# Patient Record
Sex: Male | Born: 1955 | Race: Black or African American | Hispanic: No | Smoking: Former smoker
Health system: Southern US, Community
[De-identification: ages and names within clinical notes are randomized; demographics above are authoritative.]

## PROBLEM LIST (undated history)

## (undated) DIAGNOSIS — Z95810 Presence of automatic (implantable) cardiac defibrillator: Secondary | ICD-10-CM

## (undated) DIAGNOSIS — I509 Heart failure, unspecified: Secondary | ICD-10-CM

## (undated) DIAGNOSIS — I255 Ischemic cardiomyopathy: Secondary | ICD-10-CM

## (undated) DIAGNOSIS — M1990 Unspecified osteoarthritis, unspecified site: Secondary | ICD-10-CM

## (undated) DIAGNOSIS — M109 Gout, unspecified: Secondary | ICD-10-CM

## (undated) DIAGNOSIS — IMO0001 Reserved for inherently not codable concepts without codable children: Secondary | ICD-10-CM

## (undated) DIAGNOSIS — D649 Anemia, unspecified: Secondary | ICD-10-CM

## (undated) DIAGNOSIS — J189 Pneumonia, unspecified organism: Secondary | ICD-10-CM

## (undated) DIAGNOSIS — E785 Hyperlipidemia, unspecified: Secondary | ICD-10-CM

## (undated) DIAGNOSIS — I2510 Atherosclerotic heart disease of native coronary artery without angina pectoris: Secondary | ICD-10-CM

## (undated) DIAGNOSIS — R6521 Severe sepsis with septic shock: Secondary | ICD-10-CM

## (undated) DIAGNOSIS — Z794 Long term (current) use of insulin: Secondary | ICD-10-CM

## (undated) DIAGNOSIS — R410 Disorientation, unspecified: Secondary | ICD-10-CM

## (undated) DIAGNOSIS — E119 Type 2 diabetes mellitus without complications: Secondary | ICD-10-CM

## (undated) DIAGNOSIS — I219 Acute myocardial infarction, unspecified: Secondary | ICD-10-CM

## (undated) DIAGNOSIS — Z9289 Personal history of other medical treatment: Secondary | ICD-10-CM

## (undated) DIAGNOSIS — F0150 Vascular dementia without behavioral disturbance: Secondary | ICD-10-CM

## (undated) DIAGNOSIS — A419 Sepsis, unspecified organism: Secondary | ICD-10-CM

## (undated) DIAGNOSIS — F419 Anxiety disorder, unspecified: Secondary | ICD-10-CM

## (undated) DIAGNOSIS — F4310 Post-traumatic stress disorder, unspecified: Secondary | ICD-10-CM

## (undated) DIAGNOSIS — I10 Essential (primary) hypertension: Secondary | ICD-10-CM

## (undated) DIAGNOSIS — N179 Acute kidney failure, unspecified: Secondary | ICD-10-CM

## (undated) DIAGNOSIS — F410 Panic disorder [episodic paroxysmal anxiety] without agoraphobia: Secondary | ICD-10-CM

## (undated) DIAGNOSIS — I4892 Unspecified atrial flutter: Secondary | ICD-10-CM

## (undated) HISTORY — DX: Atherosclerotic heart disease of native coronary artery without angina pectoris: I25.10

## (undated) HISTORY — DX: Unspecified atrial flutter: I48.92

## (undated) HISTORY — DX: Vascular dementia, unspecified severity, without behavioral disturbance, psychotic disturbance, mood disturbance, and anxiety: F01.50

## (undated) HISTORY — DX: Hyperlipidemia, unspecified: E78.5

## (undated) HISTORY — PX: FRACTURE SURGERY: SHX138

## (undated) HISTORY — PX: APPENDECTOMY: SHX54

## (undated) HISTORY — PX: CARDIOVERSION: SHX1299

## (undated) HISTORY — DX: Heart failure, unspecified: I50.9

## (undated) HISTORY — PX: CORONARY ANGIOPLASTY WITH STENT PLACEMENT: SHX49

## (undated) HISTORY — DX: Disorientation, unspecified: R41.0

## (undated) HISTORY — DX: Essential (primary) hypertension: I10

## (undated) HISTORY — DX: Pneumonia, unspecified organism: J18.9

## (undated) HISTORY — DX: Acute kidney failure, unspecified: N17.9

## (undated) HISTORY — DX: Anemia, unspecified: D64.9

## (undated) HISTORY — DX: Ischemic cardiomyopathy: I25.5

## (undated) HISTORY — DX: Anxiety disorder, unspecified: F41.9

## (undated) HISTORY — PX: JOINT REPLACEMENT: SHX530

## (undated) HISTORY — DX: Sepsis, unspecified organism: A41.9

## (undated) HISTORY — DX: Severe sepsis with septic shock: R65.21

## (undated) HISTORY — PX: CARDIAC DEFIBRILLATOR PLACEMENT: SHX171

---

## 1979-06-14 HISTORY — PX: ANKLE FRACTURE SURGERY: SHX122

## 1980-10-13 DIAGNOSIS — Z9289 Personal history of other medical treatment: Secondary | ICD-10-CM

## 1980-10-13 HISTORY — DX: Personal history of other medical treatment: Z92.89

## 1980-10-13 HISTORY — PX: PATELLA RECONSTRUCTION: SHX736

## 1980-10-13 HISTORY — PX: TIBIA FRACTURE SURGERY: SHX806

## 2003-04-25 ENCOUNTER — Ambulatory Visit (HOSPITAL_COMMUNITY): Admission: RE | Admit: 2003-04-25 | Discharge: 2003-04-25 | Payer: Self-pay | Admitting: Gastroenterology

## 2003-05-04 ENCOUNTER — Encounter: Payer: Self-pay | Admitting: Gastroenterology

## 2003-05-04 ENCOUNTER — Encounter: Admission: RE | Admit: 2003-05-04 | Discharge: 2003-05-04 | Payer: Self-pay | Admitting: Gastroenterology

## 2013-04-05 ENCOUNTER — Other Ambulatory Visit: Payer: Self-pay | Admitting: *Deleted

## 2013-04-05 MED ORDER — ALPRAZOLAM 0.5 MG PO TBDP: ORAL_TABLET | ORAL | Status: DC

## 2013-04-06 ENCOUNTER — Non-Acute Institutional Stay (SKILLED_NURSING_FACILITY): Payer: PRIVATE HEALTH INSURANCE | Admitting: Internal Medicine

## 2013-04-06 DIAGNOSIS — E785 Hyperlipidemia, unspecified: Secondary | ICD-10-CM

## 2013-04-06 DIAGNOSIS — E1129 Type 2 diabetes mellitus with other diabetic kidney complication: Secondary | ICD-10-CM

## 2013-04-06 DIAGNOSIS — I4891 Unspecified atrial fibrillation: Secondary | ICD-10-CM

## 2013-04-06 DIAGNOSIS — I509 Heart failure, unspecified: Secondary | ICD-10-CM

## 2013-04-14 ENCOUNTER — Other Ambulatory Visit: Payer: Self-pay | Admitting: Geriatric Medicine

## 2013-04-14 MED ORDER — OXYCODONE-ACETAMINOPHEN 5-325 MG PO TABS: 1.0000 | ORAL_TABLET | Freq: Four times a day (QID) | ORAL | Status: AC | PRN

## 2013-04-18 ENCOUNTER — Non-Acute Institutional Stay (SKILLED_NURSING_FACILITY): Payer: PRIVATE HEALTH INSURANCE | Admitting: Internal Medicine

## 2013-04-18 DIAGNOSIS — E1129 Type 2 diabetes mellitus with other diabetic kidney complication: Secondary | ICD-10-CM

## 2013-04-18 DIAGNOSIS — R748 Abnormal levels of other serum enzymes: Secondary | ICD-10-CM

## 2013-04-18 DIAGNOSIS — E875 Hyperkalemia: Secondary | ICD-10-CM

## 2013-04-18 DIAGNOSIS — N179 Acute kidney failure, unspecified: Secondary | ICD-10-CM

## 2013-04-21 ENCOUNTER — Non-Acute Institutional Stay (SKILLED_NURSING_FACILITY): Payer: PRIVATE HEALTH INSURANCE | Admitting: Adult Health

## 2013-04-21 ENCOUNTER — Encounter: Payer: Self-pay | Admitting: Adult Health

## 2013-04-21 DIAGNOSIS — I2510 Atherosclerotic heart disease of native coronary artery without angina pectoris: Secondary | ICD-10-CM

## 2013-04-21 DIAGNOSIS — E785 Hyperlipidemia, unspecified: Secondary | ICD-10-CM

## 2013-04-21 DIAGNOSIS — E1129 Type 2 diabetes mellitus with other diabetic kidney complication: Secondary | ICD-10-CM

## 2013-04-21 DIAGNOSIS — N058 Unspecified nephritic syndrome with other morphologic changes: Secondary | ICD-10-CM

## 2013-04-21 DIAGNOSIS — F411 Generalized anxiety disorder: Secondary | ICD-10-CM

## 2013-04-21 DIAGNOSIS — I509 Heart failure, unspecified: Secondary | ICD-10-CM

## 2013-04-21 DIAGNOSIS — I4892 Unspecified atrial flutter: Secondary | ICD-10-CM | POA: Insufficient documentation

## 2013-04-21 DIAGNOSIS — I5023 Acute on chronic systolic (congestive) heart failure: Secondary | ICD-10-CM

## 2013-04-21 DIAGNOSIS — K5900 Constipation, unspecified: Secondary | ICD-10-CM

## 2013-04-21 NOTE — Assessment & Plan Note (Signed)
will continue xanax 0.5 mg three times daily

## 2013-04-21 NOTE — Progress Notes (Signed)
Patient ID: Daniel Blankenship, male   DOB: Feb 03, 1956, 57 y.o.   MRN: [id number]  [facility] Allergies  Allergen Reactions  . Lisinopril   . Sulfa Antibiotics   . Zocor (Simvastatin)      Chief Complaint  Patient presents with  . Acute Visit    patient concerns     HPI: I was asked to see him regarding his chf; his ef is 15 %; he was started on demadex 40 mg yesterday as he had been on a diuretic in the past and his family was worried about his fluid build up. He denies any chest pain; but does have some mild shortness of present. He is a poor historian and is unable to fully participate in hpi or ros.   Past Medical History  Diagnosis Date  . Diabetes mellitus without complication   . Atrial flutter with rapid ventricular response   . Acute renal failure   . Vascular dementia   . CHF (congestive heart failure)   . Delirium   . Ischemic cardiomyopathy   . Septic shock   . Pneumonia   . CAD (coronary artery disease)   . Anemia   . Hypertension   . Hyperlipidemia   . Anxiety     Past Surgical History  Procedure Laterality Date  . Cardioversion    . Joint replacement    . Coronary angioplasty with stent placement    . Cardiac defibrillator placement      VITAL SIGNS BP 140/88  Pulse 140  Ht 6\' 1"  (1.854 m)  Wt 270 lb (122.471 kg)  BMI 35.63 kg/m2   Patient's Medications  New Prescriptions   No medications on file  Previous Medications   ALPRAZOLAM (NIRAVAM) 0.5 MG DISSOLVABLE TABLET    Take one tablet by mouth three times daily after meals. Hold for sedation.   ASPIRIN 81 MG TABLET    Take 81 mg by mouth daily.   ATORVASTATIN (LIPITOR) 80 MG TABLET    Take 40 mg by mouth daily.    DOCUSATE SODIUM (COLACE) 100 MG CAPSULE    Take 200 mg by mouth daily.   METOPROLOL TARTRATE (LOPRESSOR) 25 MG TABLET    Take 25 mg by mouth every 6 (six) hours.   NITROGLYCERIN (NITRODUR - DOSED IN MG/24 HR) 0.4 MG/HR    Place 1 patch onto the skin daily.   OXYCODONE-ACETAMINOPHEN (ROXICET) 5-325 MG PER TABLET    Take 1 tablet by mouth every 6 (six) hours as needed for pain.  Modified Medications   No medications on file  Discontinued Medications   No medications on file    SIGNIFICANT DIAGNOSTIC EXAMS   03-27-13: ekg: sinus tachycardia; 1st degree av block;  June 2014 hospitalization studies:  Chest x-ray: cardiomegaly; small effusions; mild pulmonary edema consistent with some element of fluid overload/congestive heart failure  Abdominal ultrasound: markedly limited examination secondary to body habitus; hepatic steatosis; nonspecific dilatation of common bile duct; no evidence of cholelithiasis, gallbladder wall thickening, or pericholecystic fluid collection. No intarhepatic biliary ductal dilatation noted; no gross hydronephrosis.   Ct of head: no definite evidence of acute intracranial abnormality; atrophy advanced for age. Although no evidence of acute infarction, mass, or hemorrhage is seen.    LABS REVIEWED:   04-15-13: wbc 12.5; hgb 9.3; hct 30.7; mcv 86; plt 292; glucose 188; bun 21; creat 1.44; k+ 5.5;na++ 136 Alk phos 553; t. Bili: 2.2  albumin 3.4 chol 87; ldl 43; trig 59; hgb a1c 7.7  04-19-13: wbc 6.9; hgb 9.0; hct 29.1; mcv 82; plt 261; glucose 157; bun 13; creat 1.06; k+ 4.3;na++ 141 Alk phos 344; t. Bili 0.9; alt 71;   albumin 3.1  Review of Systems  Constitutional: Negative for malaise/fatigue.  Respiratory: Positive for shortness of breath. Negative for cough and wheezing.   Cardiovascular: Positive for palpitations. Negative for chest pain and leg swelling.  Gastrointestinal: Negative for heartburn, abdominal pain and constipation.  Musculoskeletal: Negative for myalgias and joint pain.  Skin: Negative.   Neurological: Negative for headaches.  Psychiatric/Behavioral: Negative for depression. The patient does not have insomnia.     Physical Exam  Constitutional:  obese  Neck: Neck supple. No JVD present. No  thyromegaly present.  Cardiovascular: Intact distal pulses.   Heart rate rapid; and irregular  Respiratory: Effort normal and breath sounds normal. No respiratory distress.  GI: Soft. Bowel sounds are normal. He exhibits no distension. There is no tenderness.  Musculoskeletal: Normal range of motion. He exhibits edema.  Is in bed; has 2+ edema to mid thighs  Neurological: He is alert.  Skin: Skin is warm and dry.      ASSESSMENT/ PLAN: CHF (congestive heart failure), NYHA class III Ef is 15%; has been started on demadex 40 mg daily on 04-20-13; will check bmp in one week; will increase his lopressor to 50 mg every 6 hours and will monitor his b/p and pulse with each dose. Will continue 1500 cc fluid restriction; will begin daily weights if gain more than 3 pounds in one day or 5 pounds in one week to call provider for further instructions; will monitor his status   Other and unspecified hyperlipidemia Will continue lipitor 40 mg daily   CAD (coronary artery disease) Will continue ntg 0.4 mg patch daily and asa 81 mg daily   Atrial flutter with rapid ventricular response Is taking lopressor 25 mg every 6 hours for rate control will increase his lopressor to 50 mg every 6 hours will have nursing check blood pressure and pulse with every dose and will monitor   Anxiety state, unspecified will continue xanax 0.5 mg three times daily   Unspecified constipation Will continue colace 200 mg daily   DM (diabetes mellitus) type II controlled with renal manifestation Will continue lantus 10 units nightly and will monitor cbg QID    Time spent with patient 50 minutes

## 2013-04-21 NOTE — Assessment & Plan Note (Signed)
Is taking lopressor 25 mg every 6 hours for rate control will increase his lopressor to 50 mg every 6 hours will have nursing check blood pressure and pulse with every dose and will monitor

## 2013-04-21 NOTE — Assessment & Plan Note (Signed)
Will continue lipitor 40 mg daily

## 2013-04-21 NOTE — Assessment & Plan Note (Signed)
Will continue ntg 0.4 mg patch daily and asa 81 mg daily

## 2013-04-21 NOTE — Assessment & Plan Note (Signed)
Will continue lantus 10 units nightly and will monitor cbg QID

## 2013-04-21 NOTE — Assessment & Plan Note (Signed)
Will continue colace 200 mg daily

## 2013-04-21 NOTE — Assessment & Plan Note (Addendum)
Ef is 15%; has been started on demadex 40 mg daily on 04-20-13; will check bmp in one week; will increase his lopressor to 50 mg every 6 hours and will monitor his b/p and pulse with each dose. Will continue 1500 cc fluid restriction; will begin daily weights if gain more than 3 pounds in one day or 5 pounds in one week to call provider for further instructions; will monitor his status

## 2013-04-25 ENCOUNTER — Non-Acute Institutional Stay (SKILLED_NURSING_FACILITY): Payer: PRIVATE HEALTH INSURANCE | Admitting: Internal Medicine

## 2013-04-25 DIAGNOSIS — I4891 Unspecified atrial fibrillation: Secondary | ICD-10-CM

## 2013-04-25 DIAGNOSIS — I2510 Atherosclerotic heart disease of native coronary artery without angina pectoris: Secondary | ICD-10-CM

## 2013-04-25 DIAGNOSIS — I509 Heart failure, unspecified: Secondary | ICD-10-CM

## 2013-04-25 DIAGNOSIS — E1159 Type 2 diabetes mellitus with other circulatory complications: Secondary | ICD-10-CM | POA: Insufficient documentation

## 2013-04-25 NOTE — Progress Notes (Signed)
PROGRESS NOTE  DATE: 04/25/2013  FACILITY: Nursing Home Location: [facility]  LEVEL OF CARE: SNF (31)  Routine Visit  CHIEF COMPLAINT:  Manage CHF, atrial fibrillation and diabetes mellitus  HISTORY OF PRESENT ILLNESS:  REASSESSMENT OF ONGOING PROBLEM(S):  ATRIAL FIBRILLATION: the patients atrial fibrillation remains stable.  The patient denies DOE, tachycardia, orthopnea, transient neurological sx, pedal edema, palpitations, & PNDs.  No complications noted from the medications currently being used.  CHF:The patient does not relate significant weight changes, denies sob, DOE, orthopnea, PNDs, pedal edema, palpitations or chest pain.  CHF remains stable.  No complications form the medications being used.  DM:pt's DM remains stable.  Pt denies polyuria, polydipsia, polyphagia, changes in vision or hypoglycemic episodes.  No complications noted from the medication presently being used.  Last hemoglobin A1c is: 7.7 in 7/14.  PAST MEDICAL HISTORY : Reviewed.  No changes.  CURRENT MEDICATIONS: Reviewed per [facility]  REVIEW OF SYSTEMS:  GENERAL: no change in appetite, no fatigue, no weight changes, no fever, chills or weakness RESPIRATORY: no cough, SOB, DOE, wheezing, hemoptysis CARDIAC: no chest pain, edema or palpitations GI: no abdominal pain, diarrhea, constipation, heart burn, nausea or vomiting  PHYSICAL EXAMINATION  VS:  T 97       P 67      RR 18     BP 132/88     POX %     WT (Lb) 267.5  GENERAL: no acute distress, normal body habitus EYES: conjunctivae normal, sclerae normal, normal eye lids NECK: supple, trachea midline, no neck masses, no thyroid tenderness, no thyromegaly LYMPHATICS: no LAN in the neck, no supraclavicular LAN RESPIRATORY: breathing is even & unlabored, BS CTAB CARDIAC: RRR, no murmur,no extra heart sounds, no edema GI: abdomen soft, normal BS, no masses, no tenderness, no hepatomegaly, no splenomegaly PSYCHIATRIC: the patient is  alert & oriented to person, affect & behavior appropriate  LABS/RADIOLOGY:  7/14 hemoglobin 9, MCV 82 otherwise CBC normal, glucose 157, total protein 5.4, albumin 3.1, alkaline phosphatase 344, ALT 76 otherwise CMP normal, HDL 32 otherwise fasting lipid panel normal  ASSESSMENT/PLAN:  CHF-well compensated. Atrial fibrillation-rate controlled. Lopressor was increased. Diabetes mellitus with vascular complications-uncontrolled. Lantus was started. CAD-stable. Hyperlipidemia-Lipitor decreased. Constipation-well-controlled. Anxiety-stable.  CPT CODE: 40981

## 2013-04-30 ENCOUNTER — Non-Acute Institutional Stay (SKILLED_NURSING_FACILITY): Payer: PRIVATE HEALTH INSURANCE | Admitting: Internal Medicine

## 2013-04-30 DIAGNOSIS — E1129 Type 2 diabetes mellitus with other diabetic kidney complication: Secondary | ICD-10-CM

## 2013-04-30 DIAGNOSIS — I2510 Atherosclerotic heart disease of native coronary artery without angina pectoris: Secondary | ICD-10-CM

## 2013-04-30 DIAGNOSIS — I509 Heart failure, unspecified: Secondary | ICD-10-CM

## 2013-04-30 DIAGNOSIS — N058 Unspecified nephritic syndrome with other morphologic changes: Secondary | ICD-10-CM

## 2013-04-30 NOTE — Progress Notes (Signed)
Patient ID: Daniel Blankenship, male   DOB: [birth year], 57 y.o.   MRN: [id number] Facility; [facility] SNF. Chief complaint followup CHF, predischarge review. History this is a 57 year old man with an end-stage ischemic cardiomyopathy with an EF of 15%. He came to [place] from the [facility] however, have been previously at [facility]. He had an ICD implanted. At [facility]. He was on milrinone intravenously. He required to metoprolol for better heart rate control. He was deemed not to be a candidate for transplant. He is also a type II diabetic on insulin Lantus, so. The facility has recently been found to be hypokalemic with a potassium of 2.9. His BUN and creatinine were within normal limits. His potassium has been replaced today. This is 3.8. On 79 he was found to have an alkaline phosphatase of 344 mildly elevated ALT of 76. See an obvious explanation for this although the alkaline phosphatase has been higher than this. With regards to chronic renal failure. This is stable. Other than hypokalemia.  Review of systems; Respiratory he has exertional shortness of breath. Cardiac; there is some chest pain complaint here although he is seems to describe most of it to anxiety. This is not clearly exertional but in this setting certainly worrisome. Musculoskeletal; complaining of significant left shoulder pain over the last 3-4 days. There is no trauma history here.  Social; patient lives in [place], but has been obtaining his health care through the [place]. As I understand things so he is going home with family to [place], [place]. There are questions about his oxygen, which he has not been on previously. He is on a 1500 cc fluid.  Physical exam; O2 sat is in the high 90s on room air. Pulse rate of 100 and regular, respirations 18. Respiratory air entry is clear bilaterally. No crackles or wheezing, work of breathing is normal. Cardiac heart sounds are normal. There is no S4. No S3, JVP is not  elevated. No murmurs. Abdomen no liver no spleen no tenderness. No masses. Extremities minimal to no edema. [employer]. abduction of the left shoulder and tenderness over the anterior  Impression/plan #1 severe ischemic cardiomyopathy. He is on a 1500 cc fluid restriction, is Nitro-Dur patch 0.4 mg per hour, ASA 81 daily, Lopressor, 25 mg every 6 hours, Lipitor 80 mg at bedtime, he is not on any diuretics. 2 hypokalemia ; he was started on Demadex 40 mg a day. On 79. He did also see receive a dose of Kayexalate on 77. His potassium has been replaced and I will recheck his BMP on Monday #3 left shoulder pain; I suspect this is either a subacromial bursitis or a rotator cuff tendinitis. There would be very little options to treat this is certainly not a candidate for any anti-inflammatories. #4 chest pain; he is a vague historian nevertheless, in this setting, he probably is having some degree of angina. I am going to try to increase the beta blocker. #5 elevated liver function tests, I am not certain what is behind this especially his elevated alkaline phosphatase. He does not have symptoms and signs of obstructive cholelithiasis. #6 history of A. fib A. flutter. He has an [name].His metoprolol has been increased to 50 q6h #7 type 2 diabetes. It is clear that the instructions from the [facility] were to continue Lantus and sliding scale insulin yet. I cannot see that they actually discharged him on any of insulin. our service, started the Lantus 10 units at at bedtime on  7/7.  Patient is going on an extended journey to [place], [place] on Tuesday. I'm not completely happy with this given the severity of his heart disease. Nevertheless, if that's what he is going to do, he will probably require oxygen as he is extremely dyspneic on exertion in the setting of end-stage heart failure. We will need to check and see if the [facility] will pay for this since based on pure. O2 sat criteria, he doesn't really  meet the criteria however, I believe oxygen is justifiable. Based on the end-stage nature of his congestive heart failure, which is currently a very bad. Stage III to stage IV. He tells me he knows how to use his own insulin.

## 2013-05-02 ENCOUNTER — Non-Acute Institutional Stay (SKILLED_NURSING_FACILITY): Payer: PRIVATE HEALTH INSURANCE | Admitting: Adult Health

## 2013-05-02 DIAGNOSIS — F411 Generalized anxiety disorder: Secondary | ICD-10-CM

## 2013-05-02 DIAGNOSIS — I509 Heart failure, unspecified: Secondary | ICD-10-CM

## 2013-05-02 DIAGNOSIS — I4892 Unspecified atrial flutter: Secondary | ICD-10-CM

## 2013-05-02 DIAGNOSIS — E1129 Type 2 diabetes mellitus with other diabetic kidney complication: Secondary | ICD-10-CM

## 2013-05-09 NOTE — Progress Notes (Signed)
Patient ID: Daniel Blankenship, male   DOB: 01-20-1956, 57 y.o.   MRN: [id number]        HISTORY & PHYSICAL  DATE: 04/06/2013   FACILITY: [facility]  LEVEL OF CARE: SNF (31)  ALLERGIES:  Allergies  Allergen Reactions  . Lisinopril   . Sulfa Antibiotics   . Zocor (Simvastatin)     CHIEF COMPLAINT:  Manage CHF, atrial fibrillation, and diabetes mellitus.    HISTORY OF PRESENT ILLNESS:  The patient is a 57 year-old, Caucasian male who was hospitalized at [facility] for CHF exacerbation.  After hospitalization, he is admitted to this facility for short-term rehabilitation.   He has the following problems:   CHF:The patient does not relate significant weight changes, denies sob, DOE, orthopnea, PNDs, pedal edema, palpitations or chest pain.  CHF remains stable.  No complications form the medications being used.    DM:pt's DM remains stable.  Pt denies polyuria, polydipsia, polyphagia, changes in vision or hypoglycemic episodes.  No complications noted from the medication presently being used.  Last hemoglobin A1c is: A recent  hemoglobin A1C is not available.    ATRIAL FIBRILLATION: the patients atrial fibrillation remains stable.  The patient denies DOE, tachycardia, orthopnea, transient neurological sx, pedal edema, palpitations, & PNDs.  No complications noted from the medications currently being used.   PAST MEDICAL HISTORY :  Past Medical History  Diagnosis Date  . Diabetes mellitus without complication   . Atrial flutter with rapid ventricular response   . Acute renal failure   . Vascular dementia   . CHF (congestive heart failure)   . Delirium   . Ischemic cardiomyopathy   . Septic shock   . Pneumonia   . CAD (coronary artery disease)   . Anemia   . Hypertension   . Hyperlipidemia   . Anxiety    Class III CHF, secondary to ischemic cardiomyopathy, EF 15%  Atrial fibrillation  Chronic kidney disease  Coronary artery disease,  status post stenting  PAST SURGICAL HISTORY: Past Surgical History  Procedure Laterality Date  . Cardioversion    . Joint replacement    . Coronary angioplasty with stent placement    . Cardiac defibrillator placement      SOCIAL HISTORY: TOBACCO USE:  History of tobacco abuse, but had quit.  ALCOHOL:  History of alcohol abuse, but had quit.  ILLICIT DRUGS:  Denies illicit drug use.  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  FAMILY HISTORY:  Family History  Problem Relation Age of Onset  . Family history unknown: Yes    CURRENT MEDICATIONS: Reviewed per [facility]  REVIEW OF SYSTEMS:  See HPI otherwise 14 point ROS is negative.  PHYSICAL EXAMINATION  VS:  T 97.8       P 96      RR 22      BP 122/98      POX 95% room air       WT (Lb) 267.5  GENERAL: no acute distress, moderately obese body habitus SKIN: warm & dry, no suspicious lesions or rashes, no excessive dryness EYES: conjunctivae normal, sclerae normal, normal eye lids MOUTH/THROAT: lips without lesions,no lesions in the mouth,tongue is without lesions,uvula elevates in midline NECK: supple, trachea midline, no neck masses, no thyroid tenderness, no thyromegaly LYMPHATICS: no LAN in the neck, no supraclavicular LAN RESPIRATORY: breathing is even & unlabored, BS CTAB CARDIAC: RRR, no murmur,no extra heart sounds, no edema GI:  ABDOMEN: abdomen  soft, normal BS, no masses, no tenderness  LIVER/SPLEEN: no hepatomegaly, no splenomegaly MUSCULOSKELETAL: HEAD: normal to inspection & palpation BACK: no kyphosis, scoliosis or spinal processes tenderness EXTREMITIES: LEFT UPPER EXTREMITY: full range of motion, normal strength & tone RIGHT UPPER EXTREMITY:  full range of motion, normal strength & tone LEFT LOWER EXTREMITY: strength intact, range of motion moderate  RIGHT LOWER EXTREMITY: strength intact, range of motion moderate  PSYCHIATRIC: the patient is alert & oriented to person, affect & behavior  appropriate  LABS/RADIOLOGY: None received at admission.     ASSESSMENT/PLAN:  CHF.  Adequately compensated.   Diabetes mellitus with renal complications.  Continue Lantus. Check hemoglobin A1c.   Atrial fibrillation.  Rate controlled.    Hyperlipidemia.  Check fasting lipid panel.  Continue atorvastatin.    Coronary artery disease.  Status post stenting.    Anemia of chronic kidney disease.  Reassess hemoglobin level.   Renovascular hypertension.  Well controlled.     Check CBC and CMP.   I have reviewed patient's medical records received at admission/from hospitalization.  CPT CODE: 45409

## 2013-05-15 NOTE — Progress Notes (Signed)
Patient ID: Daniel Blankenship, male   DOB: 1956-06-19, 57 y.o.   MRN: [id number]        PROGRESS NOTE  DATE: 04/18/2013  FACILITY:  [facility]  LEVEL OF CARE: SNF (31)  Acute Visit  CHIEF COMPLAINT:  Manage acute renal failure, diabetes mellitus, and hyperkalemia.    HISTORY OF PRESENT ILLNESS: I was requested by the staff to assess the patient regarding above problem(s):  DM:pt's DM is unstable.  CBGs are elevated in 200s to 300s.  Pt denies polyuria, polydipsia, polyphagia, changes in vision or hypoglycemic episodes.  No complications noted from the medication presently being used.  Last hemoglobin A1c is:  On 04/14/2013:  Hemoglobin A1c 7.7.    ACUTE RENAL FAILURE:  On 04/14/2013:  BUN 21, creatinine 1.44.  On 03/28/2013:  BUN ___, creatinine 1.37.  He is currently not on any renal toxic medications.    HYPERKALEMIA:  On 04/14/2013:  Potassium 5.5.  On 03/28/2013:  Potassium 3.5.  He is currently not on potassium supplementation.  He denies chest pain or palpitations.    PAST MEDICAL HISTORY : Reviewed.  No changes.  CURRENT MEDICATIONS: Reviewed per [facility]  REVIEW OF SYSTEMS:  GENERAL: no change in appetite, no fatigue, no weight changes, no fever, chills or weakness RESPIRATORY: no cough, SOB, DOE,, wheezing, hemoptysis CARDIAC: no chest pain, edema or palpitations GI: no abdominal pain, diarrhea, constipation, heart burn, nausea or vomiting  PHYSICAL EXAMINATION  GENERAL: no acute distress, moderately obese body habitus EYES: conjunctivae normal, sclerae normal, normal eye lids NECK: supple, trachea midline, no neck masses, no thyroid tenderness, no thyromegaly LYMPHATICS: no LAN in the neck, no supraclavicular LAN RESPIRATORY: breathing is even & unlabored, BS CTAB CARDIAC: RRR, no murmur,no extra heart sounds EDEMA/VARICOSITIES:  +2 bilateral lower extremity pitting edema  ARTERIAL:  pedal pulses nonpalpable  GI: abdomen soft, normal BS, no masses, no  tenderness, no hepatomegaly, no splenomegaly PSYCHIATRIC: the patient is alert & oriented to person, affect & behavior appropriate  LABS/RADIOLOGY: 04/14/2013:  WBC 12.5, hemoglobin 10.3, MCV 86, ANC 8.5.    Creatinine 1.44, potassium 5.5, albumin 3.4, CO2 15, total bilirubin 2.2, alkaline phosphatase 553, AST 131, ALT 89, otherwise CMP normal.    Fasting lipid panel normal.   03/28/2013:  WBC 10.6, hemoglobin 10.3.   Creatinine 1.37, potassium 3.5.    ASSESSMENT/PLAN:  Diabetes mellitus with renal complications.  Uncontrolled.  Start Lantus 10 U q.h.s.    Hyperkalemia.  New problem.  Give kayexalate 15 g once.  Check potassium level in .a.m.    Acute renal failure.  New problem.  Patient is on fluid restriction.  Reassess.    Elevated liver functions.  New problem.  Patient asymptomatic.  Reassess.    Leukocytosis with left shift.  New problem.  Reassess.   Anemia of chronic kidney disease.  Unstable problem.  Hemoglobin declined.  Reassess.    Hyperlipidemia.  Well controlled.  Decrease Lipitor to 40 mg q.h.s.    CPT CODE: 40981

## 2013-05-16 DIAGNOSIS — E1165 Type 2 diabetes mellitus with hyperglycemia: Secondary | ICD-10-CM | POA: Insufficient documentation

## 2013-09-20 NOTE — Progress Notes (Signed)
Patient ID: Daniel Blankenship, male   DOB: 1956/01/31, 57 y.o.   MRN: [id number]     [facility]  Allergies  Allergen Reactions  . Lisinopril   . Sulfa Antibiotics   . Zocor [Simvastatin]     Chief Complaint  Patient presents with  . Discharge Note   HPI  He is being discharged to home. He is going on a prolonged trip to [place]. He will not need O2 as his sats were 95% on room air. He will need home health or dme as he is going [place]. He will need prescriptions to be written.   Past Medical History  Diagnosis Date  . Diabetes mellitus without complication   . Atrial flutter with rapid ventricular response   . Acute renal failure   . Vascular dementia   . CHF (congestive heart failure)   . Delirium   . Ischemic cardiomyopathy   . Septic shock   . Pneumonia   . CAD (coronary artery disease)   . Anemia   . Hypertension   . Hyperlipidemia   . Anxiety     Past Surgical History  Procedure Laterality Date  . Cardioversion    . Joint replacement    . Coronary angioplasty with stent placement    . Cardiac defibrillator placement      Filed Vitals:   05/02/13 1420  BP: 116/60  Pulse: 102  Height: 6\' 1"  (1.854 m)  Weight: 270 lb (122.471 kg)   MEDICATIONS  K+ 20 meq daily Lopressor 50 mg every 6 hours  lipitor 40 mg daily lantus 10 units daily 1500 cc fluid restriction ntg 0.4 mg prn Asa 81 mg daily Colace daily Xanax 0.5 mg tid Percocet 5.325 mg every 6 hours as needed Albuterol mneb treatment every 6 hours.     SIGNIFICANT DIAGNOSTIC EXAMS   03-27-13: ekg: sinus tachycardia; 1st degree av block;  June 2014 hospitalization studies:  Chest x-ray: cardiomegaly; small effusions; mild pulmonary edema consistent with some element of fluid overload/congestive heart failure  Abdominal ultrasound: markedly limited examination secondary to body habitus; hepatic steatosis; nonspecific dilatation of common bile duct; no evidence of  cholelithiasis, gallbladder wall thickening, or pericholecystic fluid collection. No intarhepatic biliary ductal dilatation noted; no gross hydronephrosis.   Ct of head: no definite evidence of acute intracranial abnormality; atrophy advanced for age. Although no evidence of acute infarction, mass, or hemorrhage is seen.    LABS REVIEWED:   04-15-13: wbc 12.5; hgb 9.3; hct 30.7; mcv 86; plt 292; glucose 188; bun 21; creat 1.44; k+ 5.5;na++ 136 Alk phos 553; t. Bili: 2.2  albumin 3.4 chol 87; ldl 43; trig 59; hgb a1c 7.7   04-19-13: wbc 6.9; hgb 9.0; hct 29.1; mcv 82; plt 261; glucose 157; bun 13; creat 1.06; k+ 4.3;na++ 141 Alk phos 344; t. Bili 0.9; alt 71;  04-28-13; glucose 163; bun 13; creat 0.90; k+2.9; na++ 139 04-30-13; k+ 3.8   albumin 3.1  Review of Systems  Constitutional: Negative for malaise/fatigue.  Respiratory: Positive for shortness of breath. Negative for cough and wheezing.   Cardiovascular: Positive for palpitations. Negative for chest pain and leg swelling.  Gastrointestinal: Negative for heartburn, abdominal pain and constipation.  Musculoskeletal: Negative for myalgias and joint pain.  Skin: Negative.   Neurological: Negative for headaches.  Psychiatric/Behavioral: Negative for depression. The patient does not have insomnia.     Physical Exam  Constitutional:  obese  Neck: Neck supple. No JVD present. No  thyromegaly present.  Cardiovascular: Intact distal pulses.   Heart rate rapid; and irregular  Respiratory: Effort normal and breath sounds normal. No respiratory distress.  GI: Soft. Bowel sounds are normal. He exhibits no distension. There is no tenderness.  Musculoskeletal: Normal range of motion. He exhibits edema.  Is in bed; has 2+ edema to calfs   Neurological: He is alert.  Skin: Skin is warm and dry.    ASSESSMENT/PLAN  Will discharge to home with prescriptions. Will not need home health or dme.

## 2014-12-12 DIAGNOSIS — R6521 Severe sepsis with septic shock: Secondary | ICD-10-CM

## 2014-12-12 DIAGNOSIS — A419 Sepsis, unspecified organism: Secondary | ICD-10-CM

## 2014-12-12 HISTORY — DX: Sepsis, unspecified organism: A41.9

## 2014-12-12 HISTORY — DX: Severe sepsis with septic shock: R65.21

## 2014-12-31 ENCOUNTER — Encounter (HOSPITAL_COMMUNITY): Payer: Self-pay | Admitting: *Deleted

## 2014-12-31 ENCOUNTER — Emergency Department (HOSPITAL_COMMUNITY): Payer: Medicare Other

## 2014-12-31 ENCOUNTER — Inpatient Hospital Stay (HOSPITAL_COMMUNITY)
Admission: EM | Admit: 2014-12-31 | Discharge: 2015-01-12 | DRG: 273 | Disposition: A | Discharge status: Home Health | Payer: Medicare Other | Attending: Cardiology | Admitting: Cardiology

## 2014-12-31 DIAGNOSIS — I130 Hypertensive heart and chronic kidney disease with heart failure and stage 1 through stage 4 chronic kidney disease, or unspecified chronic kidney disease: Secondary | ICD-10-CM | POA: Diagnosis present

## 2014-12-31 DIAGNOSIS — R1084 Generalized abdominal pain: Secondary | ICD-10-CM | POA: Diagnosis not present

## 2014-12-31 DIAGNOSIS — J9601 Acute respiratory failure with hypoxia: Secondary | ICD-10-CM | POA: Diagnosis not present

## 2014-12-31 DIAGNOSIS — E785 Hyperlipidemia, unspecified: Secondary | ICD-10-CM | POA: Diagnosis present

## 2014-12-31 DIAGNOSIS — N058 Unspecified nephritic syndrome with other morphologic changes: Secondary | ICD-10-CM

## 2014-12-31 DIAGNOSIS — R911 Solitary pulmonary nodule: Secondary | ICD-10-CM | POA: Diagnosis present

## 2014-12-31 DIAGNOSIS — F419 Anxiety disorder, unspecified: Secondary | ICD-10-CM | POA: Diagnosis present

## 2014-12-31 DIAGNOSIS — I509 Heart failure, unspecified: Secondary | ICD-10-CM | POA: Insufficient documentation

## 2014-12-31 DIAGNOSIS — R0600 Dyspnea, unspecified: Secondary | ICD-10-CM | POA: Diagnosis not present

## 2014-12-31 DIAGNOSIS — R0902 Hypoxemia: Secondary | ICD-10-CM | POA: Insufficient documentation

## 2014-12-31 DIAGNOSIS — E1165 Type 2 diabetes mellitus with hyperglycemia: Secondary | ICD-10-CM | POA: Diagnosis present

## 2014-12-31 DIAGNOSIS — J9600 Acute respiratory failure, unspecified whether with hypoxia or hypercapnia: Secondary | ICD-10-CM

## 2014-12-31 DIAGNOSIS — F0150 Vascular dementia without behavioral disturbance: Secondary | ICD-10-CM | POA: Diagnosis present

## 2014-12-31 DIAGNOSIS — Z9119 Patient's noncompliance with other medical treatment and regimen: Secondary | ICD-10-CM | POA: Diagnosis present

## 2014-12-31 DIAGNOSIS — E869 Volume depletion, unspecified: Secondary | ICD-10-CM | POA: Diagnosis present

## 2014-12-31 DIAGNOSIS — R609 Edema, unspecified: Secondary | ICD-10-CM

## 2014-12-31 DIAGNOSIS — I5043 Acute on chronic combined systolic (congestive) and diastolic (congestive) heart failure: Principal | ICD-10-CM | POA: Diagnosis present

## 2014-12-31 DIAGNOSIS — I255 Ischemic cardiomyopathy: Secondary | ICD-10-CM | POA: Diagnosis present

## 2014-12-31 DIAGNOSIS — Z7982 Long term (current) use of aspirin: Secondary | ICD-10-CM

## 2014-12-31 DIAGNOSIS — I340 Nonrheumatic mitral (valve) insufficiency: Secondary | ICD-10-CM | POA: Diagnosis not present

## 2014-12-31 DIAGNOSIS — R1033 Periumbilical pain: Secondary | ICD-10-CM | POA: Diagnosis not present

## 2014-12-31 DIAGNOSIS — I2510 Atherosclerotic heart disease of native coronary artery without angina pectoris: Secondary | ICD-10-CM | POA: Diagnosis not present

## 2014-12-31 DIAGNOSIS — Z95810 Presence of automatic (implantable) cardiac defibrillator: Secondary | ICD-10-CM

## 2014-12-31 DIAGNOSIS — I4892 Unspecified atrial flutter: Secondary | ICD-10-CM | POA: Diagnosis not present

## 2014-12-31 DIAGNOSIS — I5023 Acute on chronic systolic (congestive) heart failure: Secondary | ICD-10-CM

## 2014-12-31 DIAGNOSIS — I10 Essential (primary) hypertension: Secondary | ICD-10-CM | POA: Diagnosis present

## 2014-12-31 DIAGNOSIS — I471 Supraventricular tachycardia: Secondary | ICD-10-CM | POA: Diagnosis present

## 2014-12-31 DIAGNOSIS — N183 Chronic kidney disease, stage 3 unspecified: Secondary | ICD-10-CM | POA: Diagnosis present

## 2014-12-31 DIAGNOSIS — N179 Acute kidney failure, unspecified: Secondary | ICD-10-CM | POA: Diagnosis not present

## 2014-12-31 DIAGNOSIS — R0602 Shortness of breath: Secondary | ICD-10-CM | POA: Diagnosis present

## 2014-12-31 DIAGNOSIS — Z9861 Coronary angioplasty status: Secondary | ICD-10-CM

## 2014-12-31 DIAGNOSIS — R000 Tachycardia, unspecified: Secondary | ICD-10-CM

## 2014-12-31 DIAGNOSIS — J95821 Acute postprocedural respiratory failure: Secondary | ICD-10-CM | POA: Diagnosis not present

## 2014-12-31 DIAGNOSIS — R739 Hyperglycemia, unspecified: Secondary | ICD-10-CM | POA: Diagnosis not present

## 2014-12-31 DIAGNOSIS — E669 Obesity, unspecified: Secondary | ICD-10-CM | POA: Diagnosis present

## 2014-12-31 DIAGNOSIS — I129 Hypertensive chronic kidney disease with stage 1 through stage 4 chronic kidney disease, or unspecified chronic kidney disease: Secondary | ICD-10-CM | POA: Diagnosis present

## 2014-12-31 DIAGNOSIS — E876 Hypokalemia: Secondary | ICD-10-CM | POA: Diagnosis not present

## 2014-12-31 DIAGNOSIS — M109 Gout, unspecified: Secondary | ICD-10-CM | POA: Diagnosis present

## 2014-12-31 DIAGNOSIS — I483 Typical atrial flutter: Secondary | ICD-10-CM | POA: Diagnosis not present

## 2014-12-31 DIAGNOSIS — I081 Rheumatic disorders of both mitral and tricuspid valves: Secondary | ICD-10-CM | POA: Diagnosis present

## 2014-12-31 DIAGNOSIS — E1122 Type 2 diabetes mellitus with diabetic chronic kidney disease: Secondary | ICD-10-CM | POA: Diagnosis present

## 2014-12-31 DIAGNOSIS — Z6836 Body mass index (BMI) 36.0-36.9, adult: Secondary | ICD-10-CM

## 2014-12-31 DIAGNOSIS — R599 Enlarged lymph nodes, unspecified: Secondary | ICD-10-CM | POA: Diagnosis present

## 2014-12-31 DIAGNOSIS — Z9114 Patient's other noncompliance with medication regimen: Secondary | ICD-10-CM | POA: Diagnosis present

## 2014-12-31 DIAGNOSIS — E1129 Type 2 diabetes mellitus with other diabetic kidney complication: Secondary | ICD-10-CM | POA: Diagnosis not present

## 2014-12-31 DIAGNOSIS — K7200 Acute and subacute hepatic failure without coma: Secondary | ICD-10-CM | POA: Diagnosis not present

## 2014-12-31 DIAGNOSIS — Z452 Encounter for adjustment and management of vascular access device: Secondary | ICD-10-CM

## 2014-12-31 DIAGNOSIS — R109 Unspecified abdominal pain: Secondary | ICD-10-CM | POA: Insufficient documentation

## 2014-12-31 DIAGNOSIS — J810 Acute pulmonary edema: Secondary | ICD-10-CM | POA: Insufficient documentation

## 2014-12-31 DIAGNOSIS — IMO0002 Reserved for concepts with insufficient information to code with codable children: Secondary | ICD-10-CM

## 2014-12-31 DIAGNOSIS — R7989 Other specified abnormal findings of blood chemistry: Secondary | ICD-10-CM

## 2014-12-31 DIAGNOSIS — R945 Abnormal results of liver function studies: Secondary | ICD-10-CM

## 2014-12-31 LAB — COMPREHENSIVE METABOLIC PANEL
ALT: 177 U/L — ABNORMAL HIGH (ref 0–53)
AST: 95 U/L — AB (ref 0–37)
Albumin: 3.4 g/dL — ABNORMAL LOW (ref 3.5–5.2)
Alkaline Phosphatase: 152 U/L — ABNORMAL HIGH (ref 39–117)
Anion gap: 16 — ABNORMAL HIGH (ref 5–15)
BILIRUBIN TOTAL: 5.4 mg/dL — AB (ref 0.3–1.2)
BUN: 40 mg/dL — ABNORMAL HIGH (ref 6–23)
CALCIUM: 8.6 mg/dL (ref 8.4–10.5)
CO2: 18 mmol/L — ABNORMAL LOW (ref 19–32)
Chloride: 99 mmol/L (ref 96–112)
Creatinine, Ser: 1.8 mg/dL — ABNORMAL HIGH (ref 0.50–1.35)
GFR, EST AFRICAN AMERICAN: 46 mL/min — AB (ref >90)
GFR, EST NON AFRICAN AMERICAN: 40 mL/min — AB (ref >90)
GLUCOSE: 409 mg/dL — AB (ref 70–99)
POTASSIUM: 5.1 mmol/L (ref 3.5–5.1)
Sodium: 133 mmol/L — ABNORMAL LOW (ref 135–145)
Total Protein: 6.1 g/dL (ref 6.0–8.3)

## 2014-12-31 LAB — I-STAT CHEM 8, ED
BUN: 41 mg/dL — AB (ref 6–23)
CALCIUM ION: 1.08 mmol/L — AB (ref 1.12–1.23)
CREATININE: 1.6 mg/dL — AB (ref 0.50–1.35)
Chloride: 100 mmol/L (ref 96–112)
GLUCOSE: 422 mg/dL — AB (ref 70–99)
HEMATOCRIT: 45 % (ref 39.0–52.0)
HEMOGLOBIN: 15.3 g/dL (ref 13.0–17.0)
Potassium: 5.3 mmol/L — ABNORMAL HIGH (ref 3.5–5.1)
Sodium: 133 mmol/L — ABNORMAL LOW (ref 135–145)
TCO2: 15 mmol/L (ref 0–100)

## 2014-12-31 LAB — RAPID URINE DRUG SCREEN, HOSP PERFORMED
AMPHETAMINES: NOT DETECTED
Barbiturates: NOT DETECTED
Benzodiazepines: POSITIVE — AB
COCAINE: NOT DETECTED
OPIATES: NOT DETECTED
TETRAHYDROCANNABINOL: NOT DETECTED

## 2014-12-31 LAB — CBC WITH DIFFERENTIAL/PLATELET
Basophils Absolute: 0.1 10*3/uL (ref 0.0–0.1)
Basophils Relative: 1 % (ref 0–1)
Eosinophils Absolute: 0 10*3/uL (ref 0.0–0.7)
Eosinophils Relative: 0 % (ref 0–5)
HCT: 43.1 % (ref 39.0–52.0)
Hemoglobin: 13.9 g/dL (ref 13.0–17.0)
LYMPHS ABS: 2.2 10*3/uL (ref 0.7–4.0)
Lymphocytes Relative: 18 % (ref 12–46)
MCH: 26.9 pg (ref 26.0–34.0)
MCHC: 32.3 g/dL (ref 30.0–36.0)
MCV: 83.4 fL (ref 78.0–100.0)
Monocytes Absolute: 0.6 10*3/uL (ref 0.1–1.0)
Monocytes Relative: 4 % (ref 3–12)
NEUTROS PCT: 77 % (ref 43–77)
Neutro Abs: 9.5 10*3/uL — ABNORMAL HIGH (ref 1.7–7.7)
Platelets: 281 10*3/uL (ref 150–400)
RBC: 5.17 MIL/uL (ref 4.22–5.81)
RDW: 16.6 % — AB (ref 11.5–15.5)
WBC: 12.4 10*3/uL — ABNORMAL HIGH (ref 4.0–10.5)

## 2014-12-31 LAB — URINALYSIS, ROUTINE W REFLEX MICROSCOPIC
Glucose, UA: 500 mg/dL — AB
Hgb urine dipstick: NEGATIVE
KETONES UR: 15 mg/dL — AB
LEUKOCYTES UA: NEGATIVE
NITRITE: NEGATIVE
Protein, ur: NEGATIVE mg/dL
Specific Gravity, Urine: 1.023 (ref 1.005–1.030)
Urobilinogen, UA: 1 mg/dL (ref 0.0–1.0)
pH: 5 (ref 5.0–8.0)

## 2014-12-31 LAB — GLUCOSE, CAPILLARY: GLUCOSE-CAPILLARY: 335 mg/dL — AB (ref 70–99)

## 2014-12-31 LAB — I-STAT TROPONIN, ED: Troponin i, poc: 0.04 ng/mL (ref 0.00–0.08)

## 2014-12-31 LAB — CBG MONITORING, ED: Glucose-Capillary: 379 mg/dL — ABNORMAL HIGH (ref 70–99)

## 2014-12-31 LAB — TROPONIN I: TROPONIN I: 0.06 ng/mL — AB (ref <0.031)

## 2014-12-31 LAB — D-DIMER, QUANTITATIVE: D-Dimer, Quant: 0.6 ug/mL-FEU — ABNORMAL HIGH (ref 0.00–0.48)

## 2014-12-31 LAB — BRAIN NATRIURETIC PEPTIDE: B NATRIURETIC PEPTIDE 5: 386.4 pg/mL — AB (ref 0.0–100.0)

## 2014-12-31 MED ORDER — ASPIRIN 81 MG PO CHEW
324.0000 mg | CHEWABLE_TABLET | Freq: Once | ORAL | Status: AC
  Administered 2014-12-31: 324 mg via ORAL
  Filled 2014-12-31: qty 4

## 2014-12-31 MED ORDER — ONDANSETRON HCL 4 MG/2ML IJ SOLN
4.0000 mg | Freq: Four times a day (QID) | INTRAMUSCULAR | Status: DC | PRN
  Administered 2015-01-04: 4 mg via INTRAVENOUS
  Filled 2014-12-31: qty 2

## 2014-12-31 MED ORDER — IOHEXOL 350 MG/ML SOLN
100.0000 mL | Freq: Once | INTRAVENOUS | Status: AC | PRN
  Administered 2014-12-31: 100 mL via INTRAVENOUS

## 2014-12-31 MED ORDER — FUROSEMIDE 10 MG/ML IJ SOLN
40.0000 mg | Freq: Two times a day (BID) | INTRAMUSCULAR | Status: DC
  Administered 2014-12-31 – 2015-01-02 (×5): 40 mg via INTRAVENOUS
  Filled 2014-12-31 (×9): qty 4

## 2014-12-31 MED ORDER — LORAZEPAM 1 MG PO TABS
1.0000 mg | ORAL_TABLET | Freq: Once | ORAL | Status: AC
  Administered 2014-12-31: 1 mg via ORAL
  Filled 2014-12-31: qty 1

## 2014-12-31 MED ORDER — ALPRAZOLAM 0.5 MG PO TABS
0.5000 mg | ORAL_TABLET | Freq: Three times a day (TID) | ORAL | Status: DC | PRN
  Administered 2015-01-01 – 2015-01-06 (×6): 0.5 mg via ORAL
  Filled 2014-12-31 (×6): qty 1

## 2014-12-31 MED ORDER — INSULIN GLARGINE 100 UNIT/ML ~~LOC~~ SOLN
20.0000 [IU] | Freq: Every day | SUBCUTANEOUS | Status: DC
  Administered 2014-12-31: 20 [IU] via SUBCUTANEOUS
  Filled 2014-12-31 (×2): qty 0.2

## 2014-12-31 MED ORDER — SODIUM CHLORIDE 0.9 % IV BOLUS (SEPSIS)
1000.0000 mL | Freq: Once | INTRAVENOUS | Status: AC
  Administered 2014-12-31: 1000 mL via INTRAVENOUS

## 2014-12-31 MED ORDER — ASPIRIN 81 MG PO TABS: 81.0000 mg | ORAL_TABLET | Freq: Every day | ORAL | Status: DC

## 2014-12-31 MED ORDER — ATORVASTATIN CALCIUM 40 MG PO TABS
40.0000 mg | ORAL_TABLET | Freq: Every day | ORAL | Status: DC
  Administered 2014-12-31 – 2015-01-01 (×2): 40 mg via ORAL
  Filled 2014-12-31 (×2): qty 1

## 2014-12-31 MED ORDER — ENOXAPARIN SODIUM 30 MG/0.3ML ~~LOC~~ SOLN
30.0000 mg | SUBCUTANEOUS | Status: DC
  Administered 2014-12-31: 30 mg via SUBCUTANEOUS
  Filled 2014-12-31 (×2): qty 0.3

## 2014-12-31 MED ORDER — INSULIN ASPART 100 UNIT/ML ~~LOC~~ SOLN
0.0000 [IU] | Freq: Three times a day (TID) | SUBCUTANEOUS | Status: DC
  Administered 2015-01-01 (×2): 15 [IU] via SUBCUTANEOUS

## 2014-12-31 MED ORDER — ACETAMINOPHEN 325 MG PO TABS
650.0000 mg | ORAL_TABLET | Freq: Four times a day (QID) | ORAL | Status: DC | PRN
Start: 2014-12-31 — End: 2015-01-05

## 2014-12-31 MED ORDER — ALPRAZOLAM 0.5 MG PO TBDP: 0.2500 mg | ORAL_TABLET | Freq: Three times a day (TID) | ORAL | Status: DC | PRN

## 2014-12-31 MED ORDER — ASPIRIN 81 MG PO CHEW
81.0000 mg | CHEWABLE_TABLET | Freq: Every day | ORAL | Status: DC
  Administered 2015-01-01 – 2015-01-03 (×3): 81 mg via ORAL
  Filled 2014-12-31 (×3): qty 1

## 2014-12-31 MED ORDER — DOCUSATE SODIUM 100 MG PO CAPS
200.0000 mg | ORAL_CAPSULE | Freq: Every day | ORAL | Status: DC
  Administered 2015-01-01 – 2015-01-12 (×7): 200 mg via ORAL
  Filled 2014-12-31 (×13): qty 2

## 2014-12-31 MED ORDER — METOPROLOL TARTRATE 25 MG PO TABS
25.0000 mg | ORAL_TABLET | Freq: Two times a day (BID) | ORAL | Status: DC
  Administered 2014-12-31 – 2015-01-02 (×4): 25 mg via ORAL
  Filled 2014-12-31 (×8): qty 1

## 2014-12-31 NOTE — H&P (Signed)
[facility] History and Physical  Daniel Blankenship WUJ:[id number] DOB: Aug 12, 1956 DOA: 12/31/2014  Referring physician: EDP PCP: No primary care provider on file.   Chief Complaint: shortness of breath, abd pain, leg swelling  HPI: Daniel Blankenship is a 59 y.o. male with past medical history of CAD, ischemic cardiomyopathy, [name], CK D, type 2 diabetes presents to the ER with the above complaint.  Patient is a very vague historian. He gets his care at the [facility] and reports him gradual onset dyspnea of 2 days ago associated with abdominal tightness and swelling of feet. He reports that he is not very compliant with his medications he also says that his blood sugars have been running higher in the 200 range in the last couple days. In addition he reports cough productive of white sputum for the last 3 days as well. He denies any fevers or chills denies vomiting or diarrhea. Code STEMI was called by EMS however this was canceled by cardiology after reviewing EKG    Review of Systems:  Constitutional: Positives bolded No weight loss, night sweats, Fevers, chills, fatigue.  HEENT:  No headaches, Difficulty swallowing,Tooth/dental problems,Sore throat,  No sneezing, itching, ear ache, nasal congestion, post nasal drip,  Cardio-vascular:  No chest pain, Orthopnea, PND, swelling in lower extremities, anasarca, dizziness, palpitations  GI:  No heartburn, indigestion, abdominal pain, nausea, vomiting, diarrhea, change in bowel habits, loss of appetite  Resp:  No shortness of breath with exertion or at rest. No excess mucus, no productive cough, No non-productive cough, No coughing up of blood.No change in color of mucus.No wheezing.No chest wall deformity  Skin:  no rash or lesions.  GU:  no dysuria, change in color of urine, no urgency or frequency. No flank pain.  Musculoskeletal:  No joint pain or swelling. No decreased range of motion. No back pain.  Psych:  No  change in mood or affect. No depression or anxiety. No memory loss.   Past Medical History  Diagnosis Date  . Diabetes mellitus without complication   . Atrial flutter with rapid ventricular response   . Acute renal failure   . Vascular dementia   . CHF (congestive heart failure)   . Delirium   . Ischemic cardiomyopathy   . Septic shock   . Pneumonia   . CAD (coronary artery disease)   . Anemia   . Hypertension   . Hyperlipidemia   . Anxiety    Past Surgical History  Procedure Laterality Date  . Cardioversion    . Joint replacement    . Coronary angioplasty with stent placement    . Cardiac defibrillator placement     Social History:  reports that he has never smoked. He does not have any smokeless tobacco history on file. He reports that he does not drink alcohol. His drug history is not on file.  Allergies  Allergen Reactions  . Lisinopril   . Sulfa Antibiotics   . Zocor [Simvastatin]     Family History  Problem Relation Age of Onset  . Family history unknown: Yes   history of heart disease in his father  Prior to Admission medications   Medication Sig Start Date End Date Taking? Authorizing Provider  ALPRAZolam (NIRAVAM) 0.5 MG dissolvable tablet Take one tablet by mouth three times daily after meals. Hold for sedation. 04/05/13  Yes Sharon Seller, NP  aspirin 81 MG tablet Take 81 mg by mouth daily.   Yes Historical Provider, MD  atorvastatin (LIPITOR)  80 MG tablet Take 40 mg by mouth daily.    Yes Historical Provider, MD  docusate sodium (COLACE) 100 MG capsule Take 200 mg by mouth daily.   Yes Historical Provider, MD  metoprolol tartrate (LOPRESSOR) 25 MG tablet Take 25 mg by mouth every 6 (six) hours.   Yes Historical Provider, MD  nitroGLYCERIN (NITRODUR - DOSED IN MG/24 HR) 0.4 mg/hr Place 1 patch onto the skin daily.   Yes Historical Provider, MD  oxyCODONE-acetaminophen (ROXICET) 5-325 MG per tablet Take 1 tablet by mouth every 6 (six) hours as needed for  pain. 04/14/13  Yes Kermit Baloiffany L Reed, DO   Physical Exam: Filed Vitals:   12/31/14 1730 12/31/14 1815 12/31/14 1830 12/31/14 1845  BP: 111/93 104/77 102/84 108/74  Pulse:  125 121 124  Temp:      TempSrc:      Resp: 25 21 24 19   Height:      Weight:      SpO2:  100% 100% 100%    Wt Readings from Last 3 Encounters:  12/31/14 127.914 kg (282 lb)  05/02/13 122.471 kg (270 lb)  04/21/13 122.471 kg (270 lb)    General:  Appears calm and comfortable, alert awake oriented to self place and time no distress Eyes: PERRL, normal lids, irises & conjunctiva ENT: grossly normal hearing, lips & tongue Neck: no LAD, masses or thyromegaly Cardiovascular: RRR, no m/r/g. 1plus LE edema. Telemetry: Sinus tachycardia Respiratory: Faint bibasilar crackles. Normal respiratory effort. Abdomen: soft, obese ntnd Skin: no rash or induration seen on limited exam Musculoskeletal: grossly normal tone BUE/BLE Psychiatric: grossly normal mood and affect, speech fluent and appropriate Neurologic: grossly non-focal.          Labs on Admission:  Basic Metabolic Panel:  Recent Labs Lab 12/31/14 1355 12/31/14 1418  NA 133* 133*  K 5.1 5.3*  CL 99 100  CO2 18*  --   GLUCOSE 409* 422*  BUN 40* 41*  CREATININE 1.80* 1.60*  CALCIUM 8.6  --    Liver Function Tests:  Recent Labs Lab 12/31/14 1355  AST 95*  ALT 177*  ALKPHOS 152*  BILITOT 5.4*  PROT 6.1  ALBUMIN 3.4*   No results for input(s): LIPASE, AMYLASE in the last 168 hours. No results for input(s): AMMONIA in the last 168 hours. CBC:  Recent Labs Lab 12/31/14 1355 12/31/14 1418  WBC 12.4*  --   NEUTROABS 9.5*  --   HGB 13.9 15.3  HCT 43.1 45.0  MCV 83.4  --   PLT 281  --    Cardiac Enzymes: No results for input(s): CKTOTAL, CKMB, CKMBINDEX, TROPONINI in the last 168 hours.  BNP (last 3 results)  Recent Labs  12/31/14 1430  BNP 386.4*    ProBNP (last 3 results) No results for input(s): PROBNP in the last 8760  hours.  CBG:  Recent Labs Lab 12/31/14 1602  GLUCAP 379*    Radiological Exams on Admission: Ct Angio Chest Pe W/cm &/or Wo Cm  12/31/2014   CLINICAL DATA:  Shortness of breath. Abdominal pain. Productive cough.  EXAM: CT ANGIOGRAPHY CHEST WITH CONTRAST  TECHNIQUE: Multidetector CT imaging of the chest was performed using the standard protocol during bolus administration of intravenous contrast. Multiplanar CT image reconstructions and MIPs were obtained to evaluate the vascular anatomy.  CONTRAST:  100mL OMNIPAQUE IOHEXOL 350 MG/ML SOLN  COMPARISON:  Chest x-ray dated 12/31/2014  FINDINGS: There are no pulmonary emboli or infiltrates or effusions. There is a 5 mm  peripheral nodule in the right lower lobe on image 58 of series 6. The patient has mediastinal and hilar adenopathy. There is a 13 mm node just to the left of the aortic arch on image 29, and azygos node measuring 13 mm on image 33, and a prominent nodal mass measuring 6.6 x 2.7 x 4.8 cm in the subcarinal region. Part of the soft tissue density represents the esophagus but I think of the scar this is adenopathy.  There is cardiomegaly with dilatation of the left ventricle.  No osseous abnormality. The visualized portion of the upper abdomen is normal.  Review of the MIP images confirms the above findings.  IMPRESSION: 1. No pulmonary emboli. 2. Cardiomegaly with dilatation of the right ventricle. 3. Mediastinal and hilar adenopathy of unknown etiology. The possibility of malignancy should be considered. 4. 5 mm nodule in the periphery of the right lower lobe. This should be followed up in 6 months with chest CT without contrast if the adenopathy is not determined to be malignant.   Electronically Signed   By: Francene Boyers M.D.   On: 12/31/2014 17:33   Dg Chest Port 1 View  12/31/2014   CLINICAL DATA:  Shortness of breath and chest pain  EXAM: PORTABLE CHEST - 1 VIEW  COMPARISON:  None.  FINDINGS: Cardiac shadow is enlarged. A defibrillator  is noted. No focal infiltrate or sizable effusion is seen.  IMPRESSION: No active disease.   Electronically Signed   By: Alcide Clever M.D.   On: 12/31/2014 14:14    EKG: Independently reviewed. 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 Acute on chronic systolic CHF -Admit to telemetry, cycle cardiac enzymes IV Lasix 40 mg every 12, monitor urine output and weights -Check 2-D echo -Continue metoprolol, add low-dose ace if creatinine remains stable  2. Hyperglycemia/uncontrolled diabetes -12 metformin, add low-dose Lantus and sliding-scale insulin -Follow-up hemoglobin A1c  3. CKD 3  -stable  4. Abnormal LFTs -Denies history of chronic liver disease -Check right upper quadrant ultrasound, could be passive congestion of the liver  5. Pulmonary nodule/mediastinal adenopathy -Needs pulmonary follow-up for this  Code Status: Full code (must indicate code status--if unknown or must be presumed, indicate so) DVT Prophylaxis: Lovenox Family Communication: None at bedside Disposition Plan: Inpatient  Time spent:  [facility] Pager [phone]

## 2014-12-31 NOTE — ED Notes (Signed)
[facility]

## 2014-12-31 NOTE — ED Provider Notes (Signed)
CSN: [id number]     Arrival date & time 12/31/14  1254 History   First MD Initiated Contact with Patient 12/31/14 1300     Chief Complaint  Patient presents with  . Shortness of Breath     (Consider location/radiation/quality/duration/timing/severity/associated sxs/prior Treatment) HPI    Daniel Blankenship is a 59 y.o. male who presents for evaluation of shortness of breath, abdominal pain, weakness, present for 2 days. He has also been vomiting and states that he has not been able to take his medicine for 2 days. Also, he has not used his metformin in 2 months because his hemoglobin A1c was 7. He also notes increased leg swelling and sputum production with cough. He denies fever, chills, back pain, dysuria or urinary frequency. He does not use oxygen currently. He presents by EMS for evaluation. EMS called ahead and were concerned about STEMI. I visualized EKG, after transmission and it did not appear to be a STEMI. Patient was transported, without specific treatment beyond oxygen therapy. He does not get medical care locally. He reports taking all his medicines as directed. There are no other known modifying factors.    Past Medical History  Diagnosis Date  . Diabetes mellitus without complication   . Atrial flutter with rapid ventricular response   . Acute renal failure   . Vascular dementia   . CHF (congestive heart failure)   . Delirium   . Ischemic cardiomyopathy   . Septic shock   . Pneumonia   . CAD (coronary artery disease)   . Anemia   . Hypertension   . Hyperlipidemia   . Anxiety    Past Surgical History  Procedure Laterality Date  . Cardioversion    . Joint replacement    . Coronary angioplasty with stent placement    . Cardiac defibrillator placement     Family History  Problem Relation Age of Onset  . Family history unknown: Yes   History  Substance Use Topics  . Smoking status: Never Smoker   . Smokeless tobacco: Not on file  . Alcohol Use: No     Review of Systems  All other systems reviewed and are negative.     Allergies  Lisinopril; Sulfa antibiotics; and Zocor  Home Medications   Prior to Admission medications   Medication Sig Start Date End Date Taking? Authorizing Provider  ALPRAZolam (NIRAVAM) 0.5 MG dissolvable tablet Take one tablet by mouth three times daily after meals. Hold for sedation. 04/05/13  Yes Sharon Seller, NP  aspirin 81 MG tablet Take 81 mg by mouth daily.   Yes Historical Provider, MD  atorvastatin (LIPITOR) 80 MG tablet Take 40 mg by mouth daily.    Yes Historical Provider, MD  docusate sodium (COLACE) 100 MG capsule Take 200 mg by mouth daily.   Yes Historical Provider, MD  metoprolol tartrate (LOPRESSOR) 25 MG tablet Take 25 mg by mouth every 6 (six) hours.   Yes Historical Provider, MD  nitroGLYCERIN (NITRODUR - DOSED IN MG/24 HR) 0.4 mg/hr Place 1 patch onto the skin daily.   Yes Historical Provider, MD  oxyCODONE-acetaminophen (ROXICET) 5-325 MG per tablet Take 1 tablet by mouth every 6 (six) hours as needed for pain. 04/14/13  Yes Tiffany L Reed, DO   BP 103/86 mmHg  Pulse 121  Temp(Src) 97.7 F (36.5 C) (Oral)  Resp 16  Ht 6\' 1"  (1.854 m)  Wt 282 lb (127.914 kg)  BMI 37.21 kg/m2  SpO2 96% Physical Exam  Constitutional: He  is oriented to person, place, and time. He appears well-developed and well-nourished. He appears distressed (He is uncomfortable).  HENT:  Head: Normocephalic and atraumatic.  Right Ear: External ear normal.  Left Ear: External ear normal.  Eyes: Conjunctivae and EOM are normal. Pupils are equal, round, and reactive to light.  Neck: Normal range of motion and phonation normal. Neck supple.  Cardiovascular: Regular rhythm and normal heart sounds.   Tachycardic  Pulmonary/Chest: Effort normal and breath sounds normal. He exhibits no bony tenderness.  Abdominal: Soft. There is no tenderness.  Musculoskeletal: Normal range of motion. He exhibits no tenderness.  1+  edema both lower legs bilaterally.  Neurological: He is alert and oriented to person, place, and time. No cranial nerve deficit or sensory deficit. He exhibits normal muscle tone. Coordination normal.  Skin: Skin is warm, dry and intact.  Psychiatric: He has a normal mood and affect. His behavior is normal. Judgment and thought content normal.  Nursing note and vitals reviewed.   ED Course  Procedures (including critical care time)   13:01- viewing EKG with inferior ST elevation. Placing called to STEMI physician to discuss abnormal EKG, without associated chest pain.  16:05- Discussing with Dr. Allyson Sabal.  He cannot see EKG at this time. He suggests calling a colleague in the Hospital.  Dr. Mayford Knife came and saw the patient and felt that the EKG did not represent a STEMI. She suggested proceeding with usual emergency department evaluation.   Medications  aspirin chewable tablet 324 mg (324 mg Oral Given 12/31/14 1331)  iohexol (OMNIPAQUE) 350 MG/ML injection 100 mL (100 mLs Intravenous Contrast Given 12/31/14 1652)    Patient Vitals for the past 24 hrs:  BP Temp Temp src Pulse Resp SpO2 Height Weight  12/31/14 1545 103/86 mmHg - - - 16 - - -  12/31/14 1430 - - - (!) 121 20 96 % - -  12/31/14 1330 (!) 88/56 mmHg - - 93 21 94 % - -  12/31/14 1315 104/61 mmHg - - (!) 122 19 100 % - -  12/31/14 1258 103/75 mmHg 97.7 F (36.5 C) Oral 116 18 99 %  (1.854 m) 282 lb (127.914 kg)    5:48 PM Reevaluation with update and discussion. After initial assessment and treatment, an updated evaluation reveals he is more comfortable now. He has been able to eat. Findings discussed with patient and family members, all questions answered.[name]    18:10- discussed with hospitalist, who will admit him as an unassigned patient.  CRITICAL CARE Performed by: Flint Melter Total critical care time: 45 minutes Critical care time was exclusive of separately billable procedures and treating  other patients. Critical care was necessary to treat or prevent imminent or life-threatening deterioration. Critical care was time spent personally by me on the following activities: development of treatment plan with patient and/or surrogate as well as nursing, discussions with consultants, evaluation of patient's response to treatment, examination of patient, obtaining history from patient or surrogate, ordering and performing treatments and interventions, ordering and review of laboratory studies, ordering and review of radiographic studies, pulse oximetry and re-evaluation of patient's condition.   Labs Review Labs Reviewed  COMPREHENSIVE METABOLIC PANEL - Abnormal; Notable for the following:    Sodium 133 (*)    CO2 18 (*)    Glucose, Bld 409 (*)    BUN 40 (*)    Creatinine, Ser 1.80 (*)    Albumin 3.4 (*)    AST 95 (*)  ALT 177 (*)    Alkaline Phosphatase 152 (*)    Total Bilirubin 5.4 (*)    GFR calc non Af Amer 40 (*)    GFR calc Af Amer 46 (*)    Anion gap 16 (*)    All other components within normal limits  CBC WITH DIFFERENTIAL/PLATELET - Abnormal; Notable for the following:    WBC 12.4 (*)    RDW 16.6 (*)    Neutro Abs 9.5 (*)    All other components within normal limits  D-DIMER, QUANTITATIVE - Abnormal; Notable for the following:    D-Dimer, Quant 0.60 (*)    All other components within normal limits  BRAIN NATRIURETIC PEPTIDE - Abnormal; Notable for the following:    B Natriuretic Peptide 386.4 (*)    All other components within normal limits  URINALYSIS, ROUTINE W REFLEX MICROSCOPIC - Abnormal; Notable for the following:    Color, Urine ORANGE (*)    APPearance CLOUDY (*)    Glucose, UA 500 (*)    Bilirubin Urine MODERATE (*)    Ketones, ur 15 (*)    All other components within normal limits  URINE RAPID DRUG SCREEN (HOSP PERFORMED) - Abnormal; Notable for the following:    Benzodiazepines POSITIVE (*)    All other components within normal limits  I-STAT  CHEM 8, ED - Abnormal; Notable for the following:    Sodium 133 (*)    Potassium 5.3 (*)    BUN 41 (*)    Creatinine, Ser 1.60 (*)    Glucose, Bld 422 (*)    Calcium, Ion 1.08 (*)    All other components within normal limits  CBG MONITORING, ED - Abnormal; Notable for the following:    Glucose-Capillary 379 (*)    All other components within normal limits  I-STAT TROPOININ, ED    Imaging Review Ct Angio Chest Pe W/cm &/or Wo Cm  12/31/2014   CLINICAL DATA:  Shortness of breath. Abdominal pain. Productive cough.  EXAM: CT ANGIOGRAPHY CHEST WITH CONTRAST  TECHNIQUE: Multidetector CT imaging of the chest was performed using the standard protocol during bolus administration of intravenous contrast. Multiplanar CT image reconstructions and MIPs were obtained to evaluate the vascular anatomy.  CONTRAST:  OMNIPAQUE IOHEXOL 350 MG/ML SOLN  COMPARISON:  Chest x-ray dated 12/31/2014  FINDINGS: There are no pulmonary emboli or infiltrates or effusions. There is a 5 mm peripheral nodule in the right lower lobe on image 58 of series 6. The patient has mediastinal and hilar adenopathy. There is a 13 mm node just to the left of the aortic arch on image 29, and azygos node measuring 13 mm on image 33, and a prominent nodal mass measuring 6.6 x 2.7 x 4.8 cm in the subcarinal region. Part of the soft tissue density represents the esophagus but I think of the scar this is adenopathy.  There is cardiomegaly with dilatation of the left ventricle.  No osseous abnormality. The visualized portion of the upper abdomen is normal.  Review of the MIP images confirms the above findings.  IMPRESSION: 1. No pulmonary emboli. 2. Cardiomegaly with dilatation of the right ventricle. 3. Mediastinal and hilar adenopathy of unknown etiology. The possibility of malignancy should be considered. 4. 5 mm nodule in the periphery of the right lower lobe. This should be followed up in 6 months with chest CT without contrast if the  adenopathy is not determined to be malignant.   Electronically Signed   By: Francene Boyers  M.D.   On: 12/31/2014 17:33   Dg Chest Port 1 View  12/31/2014   CLINICAL DATA:  Shortness of breath and chest pain  EXAM: PORTABLE CHEST - 1 VIEW  COMPARISON:  None.  FINDINGS: Cardiac shadow is enlarged. A defibrillator is noted. No focal infiltrate or sizable effusion is seen.  IMPRESSION: No active disease.   Electronically Signed   By: Alcide CleverMark  Lukens M.D.   On: 12/31/2014 14:14     EKG Interpretation   Date/Time:  Sunday December 31 2014 12:59:11 EDT Ventricular Rate:  127 PR Interval:  151 QRS Duration: 138 QT Interval:  392 QTC Calculation: 570 R Axis:   0 Text Interpretation:  Sinus tachycardia Nonspecific intraventricular  conduction delay Probable anterolateral infarct, age indeterm ST  elevation, consider inferior injury No old tracing to compare Confirmed by  [facility]  MD, Dequavion Follette [phone]([id number]) on 12/31/2014 4:00:00 PM         MDM   Final diagnoses:  Dyspnea  Hyperglycemia  Noncompliance with medications  Peripheral edema  Tachycardia    Nonspecific shortness of breath, without evidence for congestive heart failure, pneumonia, or PE. Moderate hyperglycemia, with slightly elevated anion gap 16. No evidence for UTI. He is diabetic, taking insulin but not his metformin. He is likely volume depleted explained the tachycardia, but not necessarily explaining the dyspnea. Patient has vascular dementia, and may have some misunderstanding about his opiate medication treatments. He will need to be admitted for stabilization, and further treatment.  Nursing Notes Reviewed/ Care Coordinated, and agree without changes. Applicable Imaging Reviewed.  Interpretation of Laboratory Data incorporated into ED treatment  Plan: Admit   Mancel BaleElliott Maxwel Meadowcroft, MD 12/31/14 [phone]

## 2014-12-31 NOTE — ED Notes (Signed)
Dinner tray ordered, carb modified heart healthy

## 2014-12-31 NOTE — ED Notes (Addendum)
Pt called EMS for SOB, abdominal pain, and generalized weakness x 2days.  Cough with thick secretions.  Pt is not on Oxygen at home. VS are as follows :BP:130/84 HR:90 Resp:24 O2sat:95% on RA. CBG:339  324mg  of ASA taken by pt at home. No meds given by EMS in route.

## 2014-12-31 NOTE — ED Notes (Signed)
CBG: 379, given a sandwich per MD. Tray ordered.

## 2014-12-31 NOTE — ED Notes (Signed)
Pt leaving for CT.  

## 2015-01-01 ENCOUNTER — Inpatient Hospital Stay (HOSPITAL_COMMUNITY): Payer: Medicare Other

## 2015-01-01 DIAGNOSIS — E669 Obesity, unspecified: Secondary | ICD-10-CM

## 2015-01-01 DIAGNOSIS — I10 Essential (primary) hypertension: Secondary | ICD-10-CM

## 2015-01-01 DIAGNOSIS — E1165 Type 2 diabetes mellitus with hyperglycemia: Secondary | ICD-10-CM

## 2015-01-01 DIAGNOSIS — R739 Hyperglycemia, unspecified: Secondary | ICD-10-CM

## 2015-01-01 LAB — COMPREHENSIVE METABOLIC PANEL
ALBUMIN: 3.2 g/dL — AB (ref 3.5–5.2)
ALT: 183 U/L — ABNORMAL HIGH (ref 0–53)
ANION GAP: 15 (ref 5–15)
AST: 133 U/L — ABNORMAL HIGH (ref 0–37)
Alkaline Phosphatase: 141 U/L — ABNORMAL HIGH (ref 39–117)
BUN: 43 mg/dL — ABNORMAL HIGH (ref 6–23)
CO2: 16 mmol/L — AB (ref 19–32)
CREATININE: 2.01 mg/dL — AB (ref 0.50–1.35)
Calcium: 8 mg/dL — ABNORMAL LOW (ref 8.4–10.5)
Chloride: 101 mmol/L (ref 96–112)
GFR calc Af Amer: 40 mL/min — ABNORMAL LOW (ref >90)
GFR calc non Af Amer: 35 mL/min — ABNORMAL LOW (ref >90)
GLUCOSE: 401 mg/dL — AB (ref 70–99)
Potassium: 4.3 mmol/L (ref 3.5–5.1)
SODIUM: 132 mmol/L — AB (ref 135–145)
TOTAL PROTEIN: 5.8 g/dL — AB (ref 6.0–8.3)
Total Bilirubin: 6.9 mg/dL — ABNORMAL HIGH (ref 0.3–1.2)

## 2015-01-01 LAB — GLUCOSE, CAPILLARY
Glucose-Capillary: 368 mg/dL — ABNORMAL HIGH (ref 70–99)
Glucose-Capillary: 355 mg/dL — ABNORMAL HIGH (ref 70–99)
Glucose-Capillary: 126 mg/dL — ABNORMAL HIGH (ref 70–99)
Glucose-Capillary: 171 mg/dL — ABNORMAL HIGH (ref 70–99)

## 2015-01-01 LAB — TROPONIN I: Troponin I: 0.06 ng/mL — ABNORMAL HIGH (ref <0.031)

## 2015-01-01 MED ORDER — INSULIN ASPART 100 UNIT/ML ~~LOC~~ SOLN
0.0000 [IU] | SUBCUTANEOUS | Status: DC
  Administered 2015-01-01: 4 [IU] via SUBCUTANEOUS

## 2015-01-01 MED ORDER — OXYCODONE-ACETAMINOPHEN 5-325 MG PO TABS
2.0000 | ORAL_TABLET | Freq: Once | ORAL | Status: AC
Start: 2015-01-01 — End: 2015-01-01
  Administered 2015-01-01: 2 via ORAL
  Filled 2015-01-01: qty 2

## 2015-01-01 MED ORDER — INSULIN GLARGINE 100 UNIT/ML ~~LOC~~ SOLN
30.0000 [IU] | Freq: Every day | SUBCUTANEOUS | Status: DC
  Administered 2015-01-01 – 2015-01-04 (×4): 30 [IU] via SUBCUTANEOUS
  Filled 2015-01-01 (×4): qty 0.3

## 2015-01-01 MED ORDER — OXYCODONE-ACETAMINOPHEN 5-325 MG PO TABS
1.0000 | ORAL_TABLET | ORAL | Status: DC | PRN
  Administered 2015-01-01 – 2015-01-12 (×10): 1 via ORAL
  Filled 2015-01-01 (×10): qty 1

## 2015-01-01 MED ORDER — ENOXAPARIN SODIUM 60 MG/0.6ML ~~LOC~~ SOLN
60.0000 mg | SUBCUTANEOUS | Status: DC
  Administered 2015-01-01: 60 mg via SUBCUTANEOUS
  Filled 2015-01-01 (×2): qty 0.6

## 2015-01-01 NOTE — Progress Notes (Signed)
Inpatient Diabetes Program Recommendations  AACE/ADA: New Consensus Statement on Inpatient Glycemic Control (2013)  Target Ranges:  Prepandial:   less than 140 mg/dL      Peak postprandial:   less than 180 mg/dL (1-2 hours)      Critically ill patients:  140 - 180 mg/dL   Results for Daniel Blankenship, Hisao T (MRN [id number]) as of 01/01/2015 09:33  Ref. Range 12/31/2014 16:02 12/31/2014 21:58 01/01/2015 06:59  Glucose-Capillary Latest Range: 70-99 mg/dL 409379 (H) 811335 (H) 914368 (H)   Current orders for Inpatient glycemic control: Lantus 20 units QHS, Novolog 0-15 units TID with meals  Inpatient Diabetes Program Recommendations Insulin - Basal: Patient received Lantus 20 units last night at bedtime and fasting glucose is 368 mg/dl this morning. Please consider increasing Lantus to 30 units QHS. Correction (SSI): Glucose was 335 mg/dl last night at 78:2921:58 but no insulin was given because no bedtime correction ordered. Please order Novolog bedtime correction scale. HgbA1C: A1C has been ordered and is in process.  Thanks, Orlando PennerMarie Shequila Neglia, RN, MSN, CCRN, CDE Diabetes Coordinator Inpatient Diabetes Program [phone] (Team Pager from 8am to 5pm) [phone] ([facility] office) [phone] [facility] office)

## 2015-01-01 NOTE — Progress Notes (Signed)
Utilization review completed.  

## 2015-01-01 NOTE — Progress Notes (Signed)
Report given to Heather RN. Afleming, RN 

## 2015-01-01 NOTE — Progress Notes (Signed)
PROGRESS NOTE  Daniel Blankenship ZOX:[id number] DOB: [birth year] DOA: 12/31/2014 PCP: No primary care provider on file.  HPI/Recap of past 23 hours: 59 year old male with past mental history of systolic heart failure with cardiomyopathy status post [name], diabetes mellitus with secondary renal disease and medical noncompliance  Assessment/Plan: Principal Problem:  Active Problems:   DM type 2, uncontrolled, with renal complications with acute hyperglycemia: Increase Lantus with Q4 hour CBGs until sugars below 200. Appreciate diabetic education follow-up   Acute on chronic systolic CHF (congestive heart failure): Continue diuresis   CKD (chronic kidney disease), stage III: Increasing creatinine following Lasix. We'll continue as patient is volume overloaded   Obesity (BMI 30-39.9): Patient is criteria with BMI greater than 30   Essential hypertension: Blood pressure stable, continue home medications Elevated troponin: Stable 0.06. Falsely elevated lab This is not ACS and more likely in the setting of renal failure and follow him overload   Code Status: Full code  Family Communication: None present  Disposition Plan: Home once fully diuresed and CBG stable  Consultants:  None  Procedures:  Echo pending  Antibiotics:  None   Objective: BP 110/67 mmHg  Pulse 67  Temp(Src) 98.5 F (36.9 C) (Oral)  Resp 20  Ht  (1.905 m)  Wt 132.5 kg (292 lb 1.8 oz)  BMI 36.51 kg/m2  SpO2 100%  Intake/Output Summary (Last 24 hours) at 01/01/15 1730 Last data filed at 01/01/15 1300  Gross per 24 hour  Intake    480 ml  Output   1075 ml  Net   -595 ml   Filed Weights   12/31/14 1258 12/31/14 2027 01/01/15 0557  Weight: 127.914 kg (282 lb) 133.3 kg (293 lb 14 oz) 132.5 kg (292 lb 1.8 oz)    Exam:   General:  alert and oriented 3, no acute distress  Cardiovascular: irregular rhythm, borderline tachycardia  Respiratory: Clear to auscultation bilaterally  Abdomen:  Soft, nontender, nondistended, positive bowel sounds  Musculoskeletal: Clubbing or cyanosis, 1+ pitting edema from the knees down    Data Reviewed: Basic Metabolic Panel:  Recent Labs Lab 12/31/14 1355 12/31/14 1418 01/01/15 0506  NA 133* 133* 132*  K 5.1 5.3* 4.3  CL 99 100 101  CO2 18*  --  16*  GLUCOSE 409* 422* 401*  BUN 40* 41* 43*  CREATININE 1.80* 1.60* 2.01*  CALCIUM 8.6  --  8.0*   Liver Function Tests:  Recent Labs Lab 12/31/14 1355 01/01/15 0506  AST 95* 133*  ALT 177* 183*  ALKPHOS 152* 141*  BILITOT 5.4* 6.9*  PROT 6.1 5.8*  ALBUMIN 3.4* 3.2*   No results for input(s): LIPASE, AMYLASE in the last 168 hours. No results for input(s): AMMONIA in the last 168 hours. CBC:  Recent Labs Lab 12/31/14 1355 12/31/14 1418  WBC 12.4*  --   NEUTROABS 9.5*  --   HGB 13.9 15.3  HCT 43.1 45.0  MCV 83.4  --   PLT 281  --    Cardiac Enzymes:    Recent Labs Lab 12/31/14 2100 01/01/15 0506  TROPONINI 0.06* 0.06*   BNP (last 3 results)  Recent Labs  12/31/14 1430  BNP 386.4*    ProBNP (last 3 results) No results for input(s): PROBNP in the last 8760 hours.  CBG:  Recent Labs Lab 12/31/14 1602 12/31/14 2158 01/01/15 0659 01/01/15 1106  GLUCAP 379* 335* 368* 355*    No results found for this or any previous visit (from the past 240  hour(s)).   Studies: Ct Angio Chest Pe W/cm &/or Wo Cm  12/31/2014   CLINICAL DATA:  Shortness of breath. Abdominal pain. Productive cough.  EXAM: CT ANGIOGRAPHY CHEST WITH CONTRAST  TECHNIQUE: Multidetector CT imaging of the chest was performed using the standard protocol during bolus administration of intravenous contrast. Multiplanar CT image reconstructions and MIPs were obtained to evaluate the vascular anatomy.  CONTRAST:  100mL OMNIPAQUE IOHEXOL 350 MG/ML SOLN  COMPARISON:  Chest x-ray dated 12/31/2014  FINDINGS: There are no pulmonary emboli or infiltrates or effusions. There is a 5 mm peripheral nodule in  the right lower lobe on image 58 of series 6. The patient has mediastinal and hilar adenopathy. There is a 13 mm node just to the left of the aortic arch on image 29, and azygos node measuring 13 mm on image 33, and a prominent nodal mass measuring 6.6 x 2.7 x 4.8 cm in the subcarinal region. Part of the soft tissue density represents the esophagus but I think of the scar this is adenopathy.  There is cardiomegaly with dilatation of the left ventricle.  No osseous abnormality. The visualized portion of the upper abdomen is normal.  Review of the MIP images confirms the above findings.  IMPRESSION: 1. No pulmonary emboli. 2. Cardiomegaly with dilatation of the right ventricle. 3. Mediastinal and hilar adenopathy of unknown etiology. The possibility of malignancy should be considered. 4. 5 mm nodule in the periphery of the right lower lobe. This should be followed up in 6 months with chest CT without contrast if the adenopathy is not determined to be malignant.   Electronically Signed   By: Francene BoyersJames  Maxwell M.D.   On: 12/31/2014 17:33   Dg Chest Port 1 View  12/31/2014   CLINICAL DATA:  Shortness of breath and chest pain  EXAM: PORTABLE CHEST - 1 VIEW  COMPARISON:  None.  FINDINGS: Cardiac shadow is enlarged. A defibrillator is noted. No focal infiltrate or sizable effusion is seen.  IMPRESSION: No active disease.   Electronically Signed   By: Alcide CleverMark  Lukens M.D.   On: 12/31/2014 14:14    Scheduled Meds: . aspirin  81 mg Oral Daily  . docusate sodium  200 mg Oral Daily  . enoxaparin (LOVENOX) injection  60 mg Subcutaneous Q24H  . furosemide  40 mg Intravenous Q12H  . insulin aspart  0-20 Units Subcutaneous 6 times per day  . insulin glargine  30 Units Subcutaneous QHS  . metoprolol tartrate  25 mg Oral BID    Continuous Infusions:    Time spent: 25 minutes  Hollice EspyKRISHNAN,Tirso Laws K  [facility] Pager [phone]. If 7PM-7AM, please contact night-coverage at [email or web address], password [email or web address] 01/01/2015, 5:30  PM  LOS: 1 day

## 2015-01-02 ENCOUNTER — Inpatient Hospital Stay (HOSPITAL_COMMUNITY): Payer: Medicare Other

## 2015-01-02 ENCOUNTER — Encounter (HOSPITAL_COMMUNITY): Payer: Self-pay | Admitting: *Deleted

## 2015-01-02 DIAGNOSIS — I5043 Acute on chronic combined systolic (congestive) and diastolic (congestive) heart failure: Principal | ICD-10-CM

## 2015-01-02 DIAGNOSIS — R1084 Generalized abdominal pain: Secondary | ICD-10-CM

## 2015-01-02 DIAGNOSIS — K7200 Acute and subacute hepatic failure without coma: Secondary | ICD-10-CM | POA: Diagnosis present

## 2015-01-02 DIAGNOSIS — R109 Unspecified abdominal pain: Secondary | ICD-10-CM | POA: Insufficient documentation

## 2015-01-02 DIAGNOSIS — R0600 Dyspnea, unspecified: Secondary | ICD-10-CM

## 2015-01-02 LAB — PROTIME-INR
INR: 1.63 — AB (ref 0.00–1.49)
PROTHROMBIN TIME: 19.5 s — AB (ref 11.6–15.2)

## 2015-01-02 LAB — BASIC METABOLIC PANEL
Anion gap: 16 — ABNORMAL HIGH (ref 5–15)
BUN: 52 mg/dL — ABNORMAL HIGH (ref 6–23)
CALCIUM: 8.4 mg/dL (ref 8.4–10.5)
CHLORIDE: 101 mmol/L (ref 96–112)
CO2: 20 mmol/L (ref 19–32)
Creatinine, Ser: 2.13 mg/dL — ABNORMAL HIGH (ref 0.50–1.35)
GFR, EST AFRICAN AMERICAN: 37 mL/min — AB (ref >90)
GFR, EST NON AFRICAN AMERICAN: 32 mL/min — AB (ref >90)
Glucose, Bld: 99 mg/dL (ref 70–99)
POTASSIUM: 4 mmol/L (ref 3.5–5.1)
Sodium: 137 mmol/L (ref 135–145)

## 2015-01-02 LAB — HEMOGLOBIN A1C
HEMOGLOBIN A1C: 12.9 % — AB (ref 4.8–5.6)
Mean Plasma Glucose: 324 mg/dL

## 2015-01-02 LAB — GLUCOSE, CAPILLARY
GLUCOSE-CAPILLARY: 104 mg/dL — AB (ref 70–99)
GLUCOSE-CAPILLARY: 126 mg/dL — AB (ref 70–99)
Glucose-Capillary: 102 mg/dL — ABNORMAL HIGH (ref 70–99)
Glucose-Capillary: 126 mg/dL — ABNORMAL HIGH (ref 70–99)
Glucose-Capillary: 98 mg/dL (ref 70–99)
Glucose-Capillary: 126 mg/dL — ABNORMAL HIGH (ref 70–99)

## 2015-01-02 LAB — MRSA PCR SCREENING: MRSA by PCR: NEGATIVE

## 2015-01-02 LAB — CBC
HCT: 44.6 % (ref 39.0–52.0)
Hemoglobin: 15.1 g/dL (ref 13.0–17.0)
MCH: 28.4 pg (ref 26.0–34.0)
MCHC: 33.9 g/dL (ref 30.0–36.0)
MCV: 84 fL (ref 78.0–100.0)
Platelets: 264 10*3/uL (ref 150–400)
RBC: 5.31 MIL/uL (ref 4.22–5.81)
RDW: 17.3 % — ABNORMAL HIGH (ref 11.5–15.5)
WBC: 15 10*3/uL — ABNORMAL HIGH (ref 4.0–10.5)

## 2015-01-02 MED ORDER — MILRINONE IN DEXTROSE 20 MG/100ML IV SOLN
0.3750 ug/kg/min | INTRAVENOUS | Status: DC
  Administered 2015-01-02 – 2015-01-05 (×7): 0.25 ug/kg/min via INTRAVENOUS
  Administered 2015-01-05: 14:00:00 via INTRAVENOUS
  Administered 2015-01-06 – 2015-01-07 (×5): 0.375 ug/kg/min via INTRAVENOUS
  Administered 2015-01-07 (×2): 0.25 ug/kg/min via INTRAVENOUS
  Administered 2015-01-08: 0.125 ug/kg/min via INTRAVENOUS
  Administered 2015-01-08: 0.25 ug/kg/min via INTRAVENOUS
  Administered 2015-01-08: 0.125 ug/kg/min via INTRAVENOUS
  Administered 2015-01-09 – 2015-01-12 (×6): 0.25 ug/kg/min via INTRAVENOUS
  Administered 2015-01-12: 0.375 ug/kg/min via INTRAVENOUS
  Filled 2015-01-02 (×26): qty 100

## 2015-01-02 MED ORDER — AMIODARONE HCL IN DEXTROSE 360-4.14 MG/200ML-% IV SOLN
30.0000 mg/h | INTRAVENOUS | Status: DC
  Administered 2015-01-03: 60 mg/h via INTRAVENOUS
  Administered 2015-01-03 – 2015-01-04 (×2): 30 mg/h via INTRAVENOUS
  Administered 2015-01-04 – 2015-01-05 (×3): 60 mg/h via INTRAVENOUS
  Administered 2015-01-05: 14:00:00 via INTRAVENOUS
  Administered 2015-01-05: 60 mg/h via INTRAVENOUS
  Filled 2015-01-02 (×18): qty 200

## 2015-01-02 MED ORDER — IOHEXOL 300 MG/ML  SOLN
25.0000 mL | INTRAMUSCULAR | Status: AC
  Administered 2015-01-02 (×2): 25 mL via ORAL

## 2015-01-02 MED ORDER — PERFLUTREN LIPID MICROSPHERE
1.0000 mL | INTRAVENOUS | Status: AC | PRN
  Administered 2015-01-02: 2 mL via INTRAVENOUS
  Filled 2015-01-02: qty 10

## 2015-01-02 MED ORDER — AMIODARONE HCL IN DEXTROSE 360-4.14 MG/200ML-% IV SOLN
60.0000 mg/h | INTRAVENOUS | Status: AC
  Administered 2015-01-02: 60 mg/h via INTRAVENOUS
  Filled 2015-01-02 (×2): qty 200

## 2015-01-02 MED ORDER — CETYLPYRIDINIUM CHLORIDE 0.05 % MT LIQD
7.0000 mL | Freq: Two times a day (BID) | OROMUCOSAL | Status: DC
  Administered 2015-01-02 – 2015-01-04 (×5): 7 mL via OROMUCOSAL

## 2015-01-02 MED ORDER — INSULIN ASPART 100 UNIT/ML ~~LOC~~ SOLN
0.0000 [IU] | Freq: Every day | SUBCUTANEOUS | Status: DC
  Administered 2015-01-05: 2 [IU] via SUBCUTANEOUS
  Administered 2015-01-06: 3 [IU] via SUBCUTANEOUS
  Administered 2015-01-07: 4 [IU] via SUBCUTANEOUS
  Administered 2015-01-09: 3 [IU] via SUBCUTANEOUS
  Administered 2015-01-10: 2 [IU] via SUBCUTANEOUS

## 2015-01-02 MED ORDER — INSULIN ASPART 100 UNIT/ML ~~LOC~~ SOLN
0.0000 [IU] | Freq: Three times a day (TID) | SUBCUTANEOUS | Status: DC
  Administered 2015-01-02: 3 [IU] via SUBCUTANEOUS
  Administered 2015-01-03 (×2): 4 [IU] via SUBCUTANEOUS
  Administered 2015-01-04 (×2): 7 [IU] via SUBCUTANEOUS
  Administered 2015-01-05: 15 [IU] via SUBCUTANEOUS
  Administered 2015-01-06: 4 [IU] via SUBCUTANEOUS
  Administered 2015-01-06 (×2): 7 [IU] via SUBCUTANEOUS

## 2015-01-02 MED ORDER — SODIUM CHLORIDE 0.9 % IJ SOLN
10.0000 mL | Freq: Two times a day (BID) | INTRAMUSCULAR | Status: DC
  Administered 2015-01-02 – 2015-01-11 (×12): 10 mL

## 2015-01-02 MED ORDER — SODIUM CHLORIDE 0.9 % IJ SOLN
10.0000 mL | INTRAMUSCULAR | Status: DC | PRN
  Administered 2015-01-06 – 2015-01-09 (×2): 10 mL
  Filled 2015-01-02 (×2): qty 40

## 2015-01-02 MED ORDER — HEPARIN (PORCINE) IN NACL 100-0.45 UNIT/ML-% IJ SOLN
1400.0000 [IU]/h | INTRAMUSCULAR | Status: DC
  Administered 2015-01-02: 1600 [IU]/h via INTRAVENOUS
  Filled 2015-01-02 (×4): qty 250

## 2015-01-02 MED ORDER — HEPARIN BOLUS VIA INFUSION
4000.0000 [IU] | Freq: Once | INTRAVENOUS | Status: AC
  Administered 2015-01-02: 4000 [IU] via INTRAVENOUS
  Filled 2015-01-02: qty 4000

## 2015-01-02 NOTE — Progress Notes (Signed)
Inpatient Diabetes Program Recommendations  AACE/ADA: New Consensus Statement on Inpatient Glycemic Control (2013)  Target Ranges:  Prepandial:   less than 140 mg/dL      Peak postprandial:   less than 180 mg/dL (1-2 hours)      Critically ill patients:  140 - 180 mg/dL   Results for Daniel Blankenship, Daniel Blankenship (MRN [id number]) as of 01/02/2015 08:54  Ref. Range 01/01/2015 11:06 01/01/2015 16:16 01/01/2015 21:09 01/02/2015 03:25 01/02/2015 06:11  Glucose-Capillary Latest Range: 70-99 mg/dL 409355 (H) 811126 (H) 914171 (H) 104 (H) 102 (H)    Reason for assessment: elevated CBG  Diabetes history: Type 2 Outpatient Diabetes medications: none listed Current orders for Inpatient glycemic control: Lantus 30 units qhs, Novolog correction 0-10 units tid with meals, 0-5 units   Notes indicate HgA1C was drawn on 12/31/14- awaiting results.  CBG currently ideal.  Susette RacerJulie Dennie Moltz, RN, BA, MHA, CDE Diabetes Coordinator Inpatient Diabetes Program  [phone] (Team Pager) [phone] Patrcia Dolly([facility]) 01/02/2015 8:59 AM

## 2015-01-02 NOTE — Consult Note (Signed)
CARDIOLOGY CONSULT NOTE  Patient ID: Daniel Blankenship, MRN: [id number], DOB/AGE: [id number] 60 y.o. Admit date: 12/31/2014 Date of Consult: 01/02/2015  Primary Physician: No primary care provider on file. Primary Cardiologist: none (Seen in [place] [facility]) Referring Physician: Dr Rito Ehrlich  Chief Complaint: Shortness of Breath  Reason for Consultation: CHF  HPI: This is a 59 year old gentleman who was hospitalized 12/31/2014 with shortness of breath, abdominal discomfort, and leg swelling.  The patient has a fairly complex medical history. His care has been delivered through the [facility] medical system. He has also been hospitalized at [facility]. I have reviewed extensive records from a prolonged hospitalization in June 2014 at [facility]. The patient was transferred there with acute on chronic systolic heart failure. He has a known severe ischemic cardiomyopathy with LVEF less than 15%. He was treated during that admission with IV milrinone. He was evaluated by the advanced heart failure team and he was felt to be a poor candidate for consideration of advanced therapies. The patient apparently has a long history of medical noncompliance. He also has had uncontrolled diabetes. During that hospitalization he was noted to have atrial flutter and the patient was cardioverted. He's also undergone ICD placement. He has undergone multivessel PCI in the past.  The patient is a difficult historian.he has received Xanax and Percocet prior to my interview and is somewhat somnolent. There is no family at the bedside. The patient reports a proximally 3 days of worsening shortness of breath and weakness. He's also had some chest discomfort. He describes swelling in his abdomen and legs. He reports no change in his diet. He denies any recent illness and specifically denies fevers, chills, or appetite change.  The patient lives independently. He is originally from [place]. He does not  smoke cigarettes or drink alcohol. He states that "I don't get out much." He drives on rare occasion. He has been able to take care of himself until the past few days when he has become increasingly sick. He states that he's been taking his medicines as prescribed. He's been followed by a cardiologist in [place] through the [place] [facility] but does not know his name.  Medical History:  Past Medical History  Diagnosis Date  . Diabetes mellitus without complication   . Atrial flutter with rapid ventricular response   . Acute renal failure   . Vascular dementia   . CHF (congestive heart failure)   . Delirium   . Ischemic cardiomyopathy   . Septic shock   . Pneumonia   . CAD (coronary artery disease)   . Anemia   . Hypertension   . Hyperlipidemia   . Anxiety       Surgical History:  Past Surgical History  Procedure Laterality Date  . Cardioversion    . Joint replacement    . Coronary angioplasty with stent placement    . Cardiac defibrillator placement       Home Meds: Prior to Admission medications   Medication Sig Start Date End Date Taking? Authorizing Provider  ALPRAZolam (NIRAVAM) 0.5 MG dissolvable tablet Take one tablet by mouth three times daily after meals. Hold for sedation. 04/05/13  Yes Sharon Seller, NP  aspirin 81 MG tablet Take 81 mg by mouth daily.   Yes Historical Provider, MD  atorvastatin (LIPITOR) 80 MG tablet Take 40 mg by mouth daily.    Yes Historical Provider, MD  docusate sodium (COLACE) 100 MG capsule Take 200 mg by mouth daily.  Yes Historical Provider, MD  metoprolol tartrate (LOPRESSOR) 25 MG tablet Take 25 mg by mouth every 6 (six) hours.   Yes Historical Provider, MD  nitroGLYCERIN (NITRODUR - DOSED IN MG/24 HR) 0.4 mg/hr Place 1 patch onto the skin daily.   Yes Historical Provider, MD  oxyCODONE-acetaminophen (ROXICET) 5-325 MG per tablet Take 1 tablet by mouth every 6 (six) hours as needed for pain. 04/14/13  Yes Kermit Balo, DO    Inpatient Medications:  . aspirin  81 mg Oral Daily  . docusate sodium  200 mg Oral Daily  . enoxaparin (LOVENOX) injection  60 mg Subcutaneous Q24H  . furosemide  40 mg Intravenous Q12H  . insulin aspart  0-20 Units Subcutaneous TID WC  . insulin aspart  0-5 Units Subcutaneous QHS  . insulin glargine  30 Units Subcutaneous QHS  . metoprolol tartrate  25 mg Oral BID     Allergies:  Allergies  Allergen Reactions  . Lisinopril   . Sulfa Antibiotics   . Zocor [Simvastatin]     History   Social History  . Marital Status: Single    Spouse Name: N/A  . Number of Children: N/A  . Years of Education: N/A   Occupational History  . Not on file.   Social History Main Topics  . Smoking status: Never Smoker   . Smokeless tobacco: Not on file  . Alcohol Use: No  . Drug Use: Not on file  . Sexual Activity: Not on file   Other Topics Concern  . Not on file   Social History Narrative     Family History  Problem Relation Age of Onset  . Family history unknown: Yes     Review of Systems: General: negative for chills, fever, night sweats or weight changes.  ENT: negative for rhinorrhea or epistaxis Cardiovascular: see HPI Respiratory: positive for cough and wheezing GI: negative for nausea, vomiting, diarrhea, bright red blood per rectum, melena, or hematemesis. Positive for abdominal pain GU: no hematuria, urgency, or frequency Neurologic: negative for visual changes, syncope, headache, or dizziness Heme: no easy bruising or bleeding Endo: negative for excessive thirst, thyroid disorder, or flushing Musculoskeletal: negative for joint pain or swelling, negative for myalgias  All other systems reviewed and are otherwise negative except as noted above.  Physical Exam: Blood pressure 86/44, pulse 98, temperature 98.5 F (36.9 C), temperature source Oral, resp. rate 22, height  (1.905 m), weight 294 lb 5 oz (133.5 kg), SpO2 100 %. Pt is alert and oriented,  chronically ill-appearing, obese male, in no distress. The patient is somnolent but awakens easily and answers questions appropriately. HEENT: normal Neck: JVP elevated with positive HJR. Carotid upstrokes normal without bruits. No thyromegaly. Lungs: equal expansion, clear bilaterally CV: Apex is diffuse, RRR with an S3 gallop, distant heart sounds Abd: soft, NT, +BS, distended, no rebound or guarding Back: no CVA tenderness Ext: 1+ pretibial edema and calf edema bilaterally Skin: warm and dry without rash Neuro: CNII-XII intact             Strength intact = bilaterally    Labs:  Recent Labs  12/31/14 2100 01/01/15 0506  TROPONINI 0.06* 0.06*   Lab Results  Component Value Date   WBC 12.4* 12/31/2014   HGB 15.3 12/31/2014   HCT 45.0 12/31/2014   MCV 83.4 12/31/2014   PLT 281 12/31/2014    Recent Labs Lab 01/01/15 0506 01/02/15 0539  NA 132* 137  K 4.3 4.0  CL 101 101  CO2 16* 20  BUN 43* 52*  CREATININE 2.01* 2.13*  CALCIUM 8.0* 8.4  PROT 5.8*  --   BILITOT 6.9*  --   ALKPHOS 141*  --   ALT 183*  --   AST 133*  --   GLUCOSE 401* 99   No results found for: CHOL, HDL, LDLCALC, TRIG Lab Results  Component Value Date   DDIMER 0.60* 12/31/2014    Radiology/Studies:  Ct Angio Chest Pe W/cm &/or Wo Cm  12/31/2014   CLINICAL DATA:  Shortness of breath. Abdominal pain. Productive cough.  EXAM: CT ANGIOGRAPHY CHEST WITH CONTRAST  TECHNIQUE: Multidetector CT imaging of the chest was performed using the standard protocol during bolus administration of intravenous contrast. Multiplanar CT image reconstructions and MIPs were obtained to evaluate the vascular anatomy.  CONTRAST:  OMNIPAQUE IOHEXOL 350 MG/ML SOLN  COMPARISON:  Chest x-ray dated 12/31/2014  FINDINGS: There are no pulmonary emboli or infiltrates or effusions. There is a 5 mm peripheral nodule in the right lower lobe on image 58 of series 6. The patient has mediastinal and hilar adenopathy. There is a 13 mm  node just to the left of the aortic arch on image 29, and azygos node measuring 13 mm on image 33, and a prominent nodal mass measuring 6.6 x 2.7 x 4.8 cm in the subcarinal region. Part of the soft tissue density represents the esophagus but I think of the scar this is adenopathy.  There is cardiomegaly with dilatation of the left ventricle.  No osseous abnormality. The visualized portion of the upper abdomen is normal.  Review of the MIP images confirms the above findings.  IMPRESSION: 1. No pulmonary emboli. 2. Cardiomegaly with dilatation of the right ventricle. 3. Mediastinal and hilar adenopathy of unknown etiology. The possibility of malignancy should be considered. 4. 5 mm nodule in the periphery of the right lower lobe. This should be followed up in 6 months with chest CT without contrast if the adenopathy is not determined to be malignant.   Electronically Signed   By: Francene Boyers M.D.   On: 12/31/2014 17:33   Dg Chest Port 1 View  12/31/2014   CLINICAL DATA:  Shortness of breath and chest pain  EXAM: PORTABLE CHEST - 1 VIEW  COMPARISON:  None.  FINDINGS: Cardiac shadow is enlarged. A defibrillator is noted. No focal infiltrate or sizable effusion is seen.  IMPRESSION: No active disease.   Electronically Signed   By: Alcide Clever M.D.   On: 12/31/2014 14:14   US Abdomen Limited Ruq  01/01/2015   CLINICAL DATA:  Abnormal LFTs  EXAM: US ABDOMEN LIMITED - RIGHT UPPER QUADRANT  COMPARISON:  None.  FINDINGS: Gallbladder:  There is intraluminal sludge. Gallbladder wall thickness is normal, 2.0 mm. The patient was not tender over the gallbladder.  Common bile duct:  Diameter: 5.2 mm, normal  Liver:  The liver is enlarged, over 20 cm craniocaudal. It is diffusely increased in echogenicity consistent with fatty infiltration. There is a 2.5 cm focal hyperechoic homogeneous circumscribed lesion which likely represents a benign cavernous hemangioma.  IMPRESSION: 1. Enlarged liver with abnormally increased  echogenicity. Fatty infiltration or other liver disease can produce this appearance. 2. 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 MRI with contrast would conclusively characterize this abnormality.   Electronically Signed   By: Ellery Plunk M.D.   On: 01/01/2015 06:05    EKG: atrial flutter  with variable block,nonspecific IVCD, heart rate 109 bpm  Cardiac Studies: 2-D echocardiogram 01/02/2015: Study Conclusions  - Left ventricle: The cavity size was severely dilated. Wall thickness was normal. The estimated ejection fraction was 15%. Diffuse hypokinesis. There is severe hypokinesis of the entireanteroseptal myocardium. Due to tachycardia, there was fusion of early and atrial contributions to ventricular filling. Doppler parameters are consistent with high ventricular filling pressure. - Mitral valve: There was mild regurgitation. - Left atrium: The atrium was moderately dilated. - Tricuspid valve: There was moderate regurgitation. - Pulmonary arteries: PA peak pressure: 49 mm Hg (S). - Pericardium, extracardiac: A trivial pericardial effusion was identified.  ASSESSMENT AND PLAN:  1. Acute on chronic systolic heart failure, New York Heart Association functional class IV symptoms. The patient has advanced heart failure secondary to long-standing ischemic cardiomyopathy. It's possible that atrial flutter and loss of AV synchrony has caused progressive decompensation. The patient has multiple poor prognostic signs with LVEF less than 20, hypotension, and elevated liver function tests. He is going to be transferred to a cardiac ICU bed where central access will be obtained. He will be started on IV milrinone for hemodynamic support. Will continue IV Lasix. A consult will be placed to the advanced heart failure team. I discussed his case with Dr. Shirlee LatchMcLean.  2. CAD, native vessel: Unclear if  active ischemia is playing a role. He has a minimal troponin elevation with flat trend and there is no sign of acute coronary syndrome. Suspect his symptoms are primarily related to congestive heart failure at this point.  3. Atrial flutter with RVR: Recommend anticoagulation with warfarin. The patient is at very high risk of thromboembolism. Will likely proceed with cardioversion once he stabilizes. I am going to start him on IV amiodarone for rate control.  4. Elevated liver function tests with elevated transaminases and bilirubin: May be multifactorial as the patient had abdominal pain today. Suspect heart failure with hepatic congestion and low output has played a role as well.  5. Acute on chronic kidney injury: Likely related to congestive heart failure, possibly with low cardiac output and cardiorenal syndrome, also patient was administered ionated contrast 48 hours ago for a CT angiogram.  The patient's prognosis is tenuous. He will be moved to the cardiac ICU and a central venous line will be placed. He will be started on IV amiodarone, heparin, and milrinone. The advanced heart failure team will follow his care.  Signed, Tonny BollmanMichael Niyla Marone MD, [facility] Center[facility]ACC 01/02/2015, 5:12 PM

## 2015-01-02 NOTE — Procedures (Signed)
Central Venous Catheter Insertion Procedure Note Daniel Blankenship [id number] June 25, 1956  Procedure: Insertion of Central Venous Catheter Indications: Assessment of intravascular volume, Drug and/or fluid administration and Frequent blood sampling  Procedure Details Consent: Risks of procedure as well as the alternatives and risks of each were explained to the (patient/caregiver).  Consent for procedure obtained. Time Out: Verified patient identification, verified procedure, site/side was marked, verified correct patient position, special equipment/implants available, medications/allergies/relevent history reviewed, required imaging and test results available.  Performed  Maximum sterile technique was used including antiseptics, cap, gloves, gown, hand hygiene, mask and sheet. Skin prep: Chlorhexidine; local anesthetic administered A antimicrobial bonded/coated triple lumen catheter was placed in the left internal jugular vein using the Seldinger technique. Ultrasound guidance used. yes Catheter placed to 22 cm. Blood aspirated via all 3 ports and then flushed x 3. Line sutured x 2 and dressing applied.  Evaluation Blood flow good Complications: No apparent complications Patient did tolerate procedure well. Chest X-ray ordered to verify placement.  CXR: pending.  Joneen RoachPaul Hoffman, AGACNP-BC [facility] Pager [phone] or [phone]

## 2015-01-02 NOTE — Progress Notes (Addendum)
PROGRESS NOTE  Daniel Blankenship:[id number] DOB: 1955/11/07 DOA: 12/31/2014 PCP: No primary care provider on file.  HPI/Recap of past 52 hours: 59 year old male with past mental history of systolic heart failure with cardiomyopathy status post [name], diabetes mellitus with secondary renal disease and medical noncompliance admitted on 3/20 with complaints of leg swelling, abdominal pain and shortness of breath. He is also found to be markedly hyperglycemic as well as in acute heart failure. That time he was noted to have mildly elevated LFTs. Patient made it to the hospitalist service. Abdominal ultrasound done on admission noted some gallbladder sludge.  On hospital day 2, patient started to feel little bit better. Diuresed some. CBG still remain markedly elevated.  By hospital day 3 initially, patient had done well with diuresis of a few liters and sugars more controlled. That afternoon however, he started developing severe 10/10 abdominal pain. In review of his admission labs it was discovered that he had markedly elevated bilirubin at 5.4 which increased to 6.9. LFTs were only minimally elevated. Follow-up full liver function studies were ordered including hepatitis panel, coags, lipase and repeat labs as well as a stat CT scan of the abdomen and pelvis without contrast. Unfortunately, patient lost IV access and there were unable to get new peripheral IV. In addition echocardiogram was done to assess his ejection fraction and patient was found to have ejection fraction of 15%. Critical care consulted who placed central line. Cardiology consulted and patient moved to [facility] ICU with plans to start milrinone drip.  Assessment/Plan: Abdominal pain: Still unclear etiology. Could be pancreatitis given gallbladder sludge noted on ultrasound. Checking abdominal CT once patient felt to be stable. Awaiting lipase  Fulminant liver failure: Discussed with patient's family who confirmed no previous history of  liver disease. Advanced heart failure would not necessarily account for liver studies which might explain transaminitis, but not elevated bilirubin. Bili concerning for obstruction. Awaiting abdominal CT. Have ordered hepatitis panel, Tylenol levels and lipase.  End-stage acute on chronic systolic/diastolic congestive heart failure: Appreciate cardiology help. Now on IV milrinone. In review of records, patient was at [facility] on IV milrinone. Not felt to be a surgical candidate. Family and patient appeared to be relatively ignorant of severity of patient's advanced heart illness. Patient has been noncompliant as well.    DM type 2, uncontrolled, with renal complications with acute hyperglycemia: Secondary in part to noncompliance plus stress margination brought on by heart and liver issues. Patient is he required aggressive Lantus plus every 4 hours sliding scale. Need to watch given that he will likely be nothing by mouth until liver issues/abdominal pain better clarified    CKD (chronic kidney disease), stage III: Increasing creatinine following Lasix. Creatinine up today, likely as a result of heart failure and diuresis    Obesity (BMI 30-39.9): Patient is criteria with BMI greater than 30   Essential hypertension: Patient has been more hypotensive today, likely from poor cardiogenic output Elevated troponin: Stable 0.06. Falsely elevated lab This is not ACS and more likely in the setting of renal failure and heart failure   Code Status: Full code  Family Communication: Met with wife and son earlier  Disposition Plan: Critically ill, may not survive this hospitalization. Transfer to cardiac ICU  Consultants:  None  Procedures:  Central line placed by crit okay or 3/22  Echocardiogram done 3/22: Ejection fraction 15%, elevated diastolic dysfunction  Antibiotics:  None   Objective: BP 86/44 mmHg  Pulse 98  Temp(Src)  98.5 F (36.9 C) (Oral)  Resp 22  Ht 6' 3"  (1.905 m)   Wt 133.5 kg (294 lb 5 oz)  BMI 36.79 kg/m2  SpO2 100%  Intake/Output Summary (Last 24 hours) at 01/02/15 1927 Last data filed at 01/02/15 1630  Gross per 24 hour  Intake    240 ml  Output    900 ml  Net   -660 ml   Filed Weights   12/31/14 2027 01-15-2015 0557 01/02/15 0329  Weight: 133.3 kg (293 lb 14 oz) 132.5 kg (292 lb 1.8 oz) 133.5 kg (294 lb 5 oz)    Exam:   General:  alert and oriented 3, no acute distress  Cardiovascular: irregular rhythm, borderline tachycardia  Respiratory: Clear to auscultation bilaterally  Abdomen: Soft, nontender, nondistended, positive bowel sounds  Musculoskeletal: Clubbing or cyanosis, 1+ pitting edema from the knees down    Data Reviewed: Basic Metabolic Panel:  Recent Labs Lab 12/31/14 1355 12/31/14 1418 2015-01-15 0506 01/02/15 0539  NA 133* 133* 132* 137  K 5.1 5.3* 4.3 4.0  CL 99 100 101 101  CO2 18*  --  16* 20  GLUCOSE 409* 422* 401* 99  BUN 40* 41* 43* 52*  CREATININE 1.80* 1.60* 2.01* 2.13*  CALCIUM 8.6  --  8.0* 8.4   Liver Function Tests:  Recent Labs Lab 12/31/14 1355 2015/01/15 0506  AST 95* 133*  ALT 177* 183*  ALKPHOS 152* 141*  BILITOT 5.4* 6.9*  PROT 6.1 5.8*  ALBUMIN 3.4* 3.2*   No results for input(s): LIPASE, AMYLASE in the last 168 hours. No results for input(s): AMMONIA in the last 168 hours. CBC:  Recent Labs Lab 12/31/14 1355 12/31/14 1418 01/02/15 1530  WBC 12.4*  --  15.0*  NEUTROABS 9.5*  --   --   HGB 13.9 15.3 15.1  HCT 43.1 45.0 44.6  MCV 83.4  --  84.0  PLT 281  --  264   Cardiac Enzymes:    Recent Labs Lab 12/31/14 2100 Jan 15, 2015 0506  TROPONINI 0.06* 0.06*   BNP (last 3 results)  Recent Labs  12/31/14 1430  BNP 386.4*    ProBNP (last 3 results) No results for input(s): PROBNP in the last 8760 hours.  CBG:  Recent Labs Lab 01/02/15 0325 01/02/15 0611 01/02/15 0853 01/02/15 1122 01/02/15 1557  GLUCAP 104* 102* 126* 126* 98    No results found for this  or any previous visit (from the past 240 hour(s)).   Studies: US Abdomen Limited Ruq  01-15-15   CLINICAL DATA:  Abnormal LFTs  EXAM: US ABDOMEN LIMITED - RIGHT UPPER QUADRANT  COMPARISON:  None.  FINDINGS: Gallbladder:  There is intraluminal sludge. Gallbladder wall thickness is normal, 2.0 mm. The patient was not tender over the gallbladder.  Common bile duct:  Diameter: 5.2 mm, normal  Liver:  The liver is enlarged, over 20 cm craniocaudal. It is diffusely increased in echogenicity consistent with fatty infiltration. There is a 2.5 cm focal hyperechoic homogeneous circumscribed lesion which likely represents a benign cavernous hemangioma.  IMPRESSION: 1. Enlarged liver with abnormally increased echogenicity. Fatty infiltration or other liver disease can produce this appearance. 2. 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 MRI with contrast would conclusively characterize this abnormality.   Electronically Signed   By: Andreas Newport M.D.   On: 01/15/2015 06:05    Scheduled Meds: . aspirin  81 mg Oral Daily  . docusate  sodium  200 mg Oral Daily  . furosemide  40 mg Intravenous Q12H  . heparin  4,000 Units Intravenous Once  . insulin aspart  0-20 Units Subcutaneous TID WC  . insulin aspart  0-5 Units Subcutaneous QHS  . insulin glargine  30 Units Subcutaneous QHS  . metoprolol tartrate  25 mg Oral BID    Continuous Infusions: . amiodarone    . [START ON 01/03/2015] amiodarone    . heparin    . milrinone       Time spent: 65 minutes which involved  45 minutes of critical care time  [facility] Pager [phone]. If 7PM-7AM, please contact night-coverage at [email or web address], password [facility] 01/02/2015, 7:27 PM  LOS: 2 days

## 2015-01-02 NOTE — Progress Notes (Signed)
Inpatient Diabetes Program Recommendations  AACE/ADA: New Consensus Statement on Inpatient Glycemic Control (2013)  Target Ranges:  Prepandial:   less than 140 mg/dL      Peak postprandial:   less than 180 mg/dL (1-2 hours)      Critically ill patients:  140 - 180 mg/dL   Called into the patient room to speak to patient after obtaining A1C result of 12.9%- transferred to the RN instead.  RN reports that the patient is being transferred to ICU and therefore it is an inappropriate time for teaching. Will continue to follow as patient may require insulin teaching for discharge.  When patient is stable, recommend staff begin insulin teaching at the bedside.  Susette RacerJulie Katira Dumais, RN, BA, MHA, CDE Diabetes Coordinator Inpatient Diabetes Program  [phone] (Team Pager) [phone] Patrcia Dolly([facility]) 01/02/2015 5:24 PM

## 2015-01-02 NOTE — Progress Notes (Signed)
ANTICOAGULATION CONSULT NOTE - Initial Consult  Pharmacy Consult for Heparin Indication: atrial fibrillation  Allergies  Allergen Reactions  . Lisinopril   . Sulfa Antibiotics   . Zocor [Simvastatin]     Patient Measurements: Height: 6\' 3"  (190.5 cm) Weight: 294 lb 5 oz (133.5 kg) IBW/kg (Calculated) : 84.5 Heparin Dosing Weight: 113 kg  Vital Signs: BP: 86/44 mmHg (03/22 1340) Pulse Rate: 98 (03/22 1340)  Labs:  Recent Labs  12/31/14 1355 12/31/14 1418 12/31/14 2100 01/01/15 0506 01/02/15 0539  HGB 13.9 15.3  --   --   --   HCT 43.1 45.0  --   --   --   PLT 281  --   --   --   --   CREATININE 1.80* 1.60*  --  2.01* 2.13*  TROPONINI  --   --  0.06* 0.06*  --     Estimated Creatinine Clearance: 55 mL/min (by C-G formula based on Cr of 2.13).   Medical History: Past Medical History  Diagnosis Date  . Diabetes mellitus without complication   . Atrial flutter with rapid ventricular response   . Acute renal failure   . Vascular dementia   . CHF (congestive heart failure)   . Delirium   . Ischemic cardiomyopathy   . Septic shock   . Pneumonia   . CAD (coronary artery disease)   . Anemia   . Hypertension   . Hyperlipidemia   . Anxiety     Medications:  Prescriptions prior to admission  Medication Sig Dispense Refill Last Dose  . ALPRAZolam (NIRAVAM) 0.5 MG dissolvable tablet Take one tablet by mouth three times daily after meals. Hold for sedation. 90 tablet 5 12/31/2014 at Unknown time  . aspirin 81 MG tablet Take 81 mg by mouth daily.   12/29/2014  . atorvastatin (LIPITOR) 80 MG tablet Take 40 mg by mouth daily.    12/29/2014  . docusate sodium (COLACE) 100 MG capsule Take 200 mg by mouth daily.   Past Month at Unknown time  . metoprolol tartrate (LOPRESSOR) 25 MG tablet Take 25 mg by mouth every 6 (six) hours.   12/29/2014  . nitroGLYCERIN (NITRODUR - DOSED IN MG/24 HR) 0.4 mg/hr Place 1 patch onto the skin daily.   unknown  . oxyCODONE-acetaminophen  (ROXICET) 5-325 MG per tablet Take 1 tablet by mouth every 6 (six) hours as needed for pain. 120 tablet 0 Past Week at Unknown time    Assessment: 59 y.o. male presents with acute on chronic systolic HF. To begin heparin for aflutter with RVR. CBC stable. Scr trending up to 2.13, est normalized CrCl ~40 ml/min. LFTs elevated at baseline and trending up.   Pt was on Lovenox 60mg  (~0.5mg /kg) SQ q24h for VTE prophylaxis - last dose given 3/21 2200.  Goal of Therapy:  Heparin level 0.3-0.7 units/ml Monitor platelets by anticoagulation protocol: Yes   Plan:  Heparin IV bolus 4000 units Heparin gtt at 1600 units/hr 6 hour heparin level Daily heparin level and CBC  Christoper Fabianaron Embrie Mikkelsen, PharmD, BCPS Clinical pharmacist, pager [phone] 01/02/2015,6:07 PM

## 2015-01-02 NOTE — Progress Notes (Signed)
Echocardiogram 2D Echocardiogram with Definity has been performed.  Daniel Blankenship 01/02/2015, 11:02 AM

## 2015-01-03 ENCOUNTER — Encounter (HOSPITAL_COMMUNITY): Payer: Self-pay | Admitting: Radiology

## 2015-01-03 ENCOUNTER — Inpatient Hospital Stay (HOSPITAL_COMMUNITY): Payer: Medicare Other

## 2015-01-03 DIAGNOSIS — N179 Acute kidney failure, unspecified: Secondary | ICD-10-CM

## 2015-01-03 DIAGNOSIS — I483 Typical atrial flutter: Secondary | ICD-10-CM

## 2015-01-03 DIAGNOSIS — I2510 Atherosclerotic heart disease of native coronary artery without angina pectoris: Secondary | ICD-10-CM

## 2015-01-03 DIAGNOSIS — R1033 Periumbilical pain: Secondary | ICD-10-CM

## 2015-01-03 LAB — GLUCOSE, CAPILLARY
GLUCOSE-CAPILLARY: 194 mg/dL — AB (ref 70–99)
GLUCOSE-CAPILLARY: 146 mg/dL — AB (ref 70–99)
Glucose-Capillary: 118 mg/dL — ABNORMAL HIGH (ref 70–99)
Glucose-Capillary: 130 mg/dL — ABNORMAL HIGH (ref 70–99)
Glucose-Capillary: 180 mg/dL — ABNORMAL HIGH (ref 70–99)

## 2015-01-03 LAB — HEPARIN LEVEL (UNFRACTIONATED): HEPARIN UNFRACTIONATED: 0.84 [IU]/mL — AB (ref 0.30–0.70)

## 2015-01-03 LAB — CARBOXYHEMOGLOBIN
CARBOXYHEMOGLOBIN: 1.5 % (ref 0.5–1.5)
Methemoglobin: 0.9 % (ref 0.0–1.5)
O2 SAT: 56.2 %
Total hemoglobin: 12.9 g/dL — ABNORMAL LOW (ref 13.5–18.0)

## 2015-01-03 LAB — BASIC METABOLIC PANEL
Anion gap: 12 (ref 5–15)
BUN: 50 mg/dL — AB (ref 6–23)
CO2: 21 mmol/L (ref 19–32)
Calcium: 7.8 mg/dL — ABNORMAL LOW (ref 8.4–10.5)
Chloride: 101 mmol/L (ref 96–112)
Creatinine, Ser: 2.02 mg/dL — ABNORMAL HIGH (ref 0.50–1.35)
GFR, EST AFRICAN AMERICAN: 40 mL/min — AB (ref >90)
GFR, EST NON AFRICAN AMERICAN: 34 mL/min — AB (ref >90)
GLUCOSE: 128 mg/dL — AB (ref 70–99)
Potassium: 3.4 mmol/L — ABNORMAL LOW (ref 3.5–5.1)
Sodium: 134 mmol/L — ABNORMAL LOW (ref 135–145)

## 2015-01-03 LAB — HEPATIC FUNCTION PANEL
ALT: 253 U/L — ABNORMAL HIGH (ref 0–53)
AST: 250 U/L — ABNORMAL HIGH (ref 0–37)
Albumin: 2.9 g/dL — ABNORMAL LOW (ref 3.5–5.2)
Alkaline Phosphatase: 227 U/L — ABNORMAL HIGH (ref 39–117)
BILIRUBIN DIRECT: 2.6 mg/dL — AB (ref 0.0–0.5)
Indirect Bilirubin: 3.6 mg/dL — ABNORMAL HIGH (ref 0.3–0.9)
Total Bilirubin: 6.2 mg/dL — ABNORMAL HIGH (ref 0.3–1.2)
Total Protein: 4.7 g/dL — ABNORMAL LOW (ref 6.0–8.3)

## 2015-01-03 LAB — CBC
HCT: 38 % — ABNORMAL LOW (ref 39.0–52.0)
Hemoglobin: 12.7 g/dL — ABNORMAL LOW (ref 13.0–17.0)
MCH: 27.1 pg (ref 26.0–34.0)
MCHC: 33.4 g/dL (ref 30.0–36.0)
MCV: 81.2 fL (ref 78.0–100.0)
Platelets: 234 10*3/uL (ref 150–400)
RBC: 4.68 MIL/uL (ref 4.22–5.81)
RDW: 16.6 % — ABNORMAL HIGH (ref 11.5–15.5)
WBC: 10.6 10*3/uL — AB (ref 4.0–10.5)

## 2015-01-03 LAB — LIPASE, BLOOD: Lipase: 28 U/L (ref 11–59)

## 2015-01-03 MED ORDER — FUROSEMIDE 10 MG/ML IJ SOLN
40.0000 mg | Freq: Three times a day (TID) | INTRAMUSCULAR | Status: DC
  Administered 2015-01-03 – 2015-01-04 (×3): 40 mg via INTRAVENOUS
  Filled 2015-01-03 (×4): qty 4

## 2015-01-03 MED ORDER — HYDRALAZINE HCL 25 MG PO TABS
12.5000 mg | ORAL_TABLET | Freq: Three times a day (TID) | ORAL | Status: DC
Start: 2015-01-03 — End: 2015-01-05
  Administered 2015-01-03 – 2015-01-05 (×6): 12.5 mg via ORAL
  Filled 2015-01-03 (×10): qty 0.5

## 2015-01-03 MED ORDER — AMIODARONE IV BOLUS ONLY 150 MG/100ML
150.0000 mg | Freq: Once | INTRAVENOUS | Status: AC
  Administered 2015-01-03: 150 mg via INTRAVENOUS

## 2015-01-03 MED ORDER — ISOSORBIDE MONONITRATE ER 30 MG PO TB24
30.0000 mg | ORAL_TABLET | Freq: Every day | ORAL | Status: DC
  Administered 2015-01-03 – 2015-01-04 (×2): 30 mg via ORAL
  Filled 2015-01-03 (×3): qty 1

## 2015-01-03 MED ORDER — APIXABAN 5 MG PO TABS
5.0000 mg | ORAL_TABLET | Freq: Two times a day (BID) | ORAL | Status: DC
  Administered 2015-01-03 – 2015-01-12 (×18): 5 mg via ORAL
  Filled 2015-01-03 (×20): qty 1

## 2015-01-03 MED ORDER — POTASSIUM CHLORIDE CRYS ER 20 MEQ PO TBCR
40.0000 meq | EXTENDED_RELEASE_TABLET | Freq: Once | ORAL | Status: AC
  Administered 2015-01-03: 40 meq via ORAL
  Filled 2015-01-03: qty 2

## 2015-01-03 NOTE — Progress Notes (Signed)
ANTICOAGULATION CONSULT NOTE - Follow up Consult  Pharmacy Consult for Heparin > apixaban Indication: atrial fibrillation  Allergies  Allergen Reactions  . Lisinopril   . Sulfa Antibiotics   . Zocor [Simvastatin]     Patient Measurements: Height:  (190.5 cm) Weight: 293 lb 14 oz (133.3 kg) IBW/kg (Calculated) : 84.5 Heparin Dosing Weight: 113 kg  Vital Signs: Temp: 98 F (36.7 C) (03/23 0700) Temp Source: Oral (03/23 0700) BP: 111/67 mmHg (03/23 0700) Pulse Rate: 112 (03/23 0700)  Labs:  Recent Labs  12/31/14 1355 12/31/14 1418 12/31/14 2100 01/01/15 0506 01/02/15 0539 01/02/15 1530 01/03/15 0350 01/03/15 0400  HGB 13.9 15.3  --   --   --  15.1 12.7*  --   HCT 43.1 45.0  --   --   --  44.6 38.0*  --   PLT 281  --   --   --   --  264 234  --   LABPROT  --   --   --   --   --  19.5*  --   --   INR  --   --   --   --   --  1.63*  --   --   HEPARINUNFRC  --   --   --   --   --   --   --  0.84*  CREATININE 1.80* 1.60*  --  2.01* 2.13*  --   --  2.02*  TROPONINI  --   --  0.06* 0.06*  --   --   --   --     Estimated Creatinine Clearance: 57.9 mL/min (by C-G formula based on Cr of 2.02).   Medical History: Past Medical History  Diagnosis Date  . Diabetes mellitus without complication   . Atrial flutter with rapid ventricular response   . Acute renal failure   . Vascular dementia   . CHF (congestive heart failure)   . Delirium   . Ischemic cardiomyopathy   . Septic shock   . Pneumonia   . CAD (coronary artery disease)   . Anemia   . Hypertension   . Hyperlipidemia   . Anxiety     Medications:  Prescriptions prior to admission  Medication Sig Dispense Refill Last Dose  . ALPRAZolam (NIRAVAM) 0.5 MG dissolvable tablet Take one tablet by mouth three times daily after meals. Hold for sedation. 90 tablet 5 12/31/2014 at Unknown time  . aspirin 81 MG tablet Take 81 mg by mouth daily.   12/29/2014  . atorvastatin (LIPITOR) 80 MG tablet Take 40 mg by mouth  daily.    12/29/2014  . docusate sodium (COLACE) 100 MG capsule Take 200 mg by mouth daily.   Past Month at Unknown time  . metoprolol tartrate (LOPRESSOR) 25 MG tablet Take 25 mg by mouth every 6 (six) hours.   12/29/2014  . nitroGLYCERIN (NITRODUR - DOSED IN MG/24 HR) 0.4 mg/hr Place 1 patch onto the skin daily.   unknown  . oxyCODONE-acetaminophen (ROXICET) 5-325 MG per tablet Take 1 tablet by mouth every 6 (six) hours as needed for pain. 120 tablet 0 Past Week at Unknown time    Assessment: 59 y.o. male presents with acute on chronic systolic HF. Heparin started 3/22 for aflutter with RVR - and amiodarone drip. CBC stable. Scr trended up BL 1.6> 2, est normalized CrCl ~40 ml/min. LFTs elevated at baseline and trending up - possible liver congestion will recheck. Will transition from heparin to  apixaban 5mg  BID - Cr 2, age < 80 Wt > 60kg Goal of Therapy:  Heparin level 0.3-0.7 units/ml Monitor platelets by anticoagulation protocol: Yes   Plan:  Stop heparin drip Apixaban 5mg  BID Follow renal fx, lfts and CBC  Leota SauersLisa Kallan Bischoff Pharm.D. CPP, BCPS Clinical Pharmacist [phone] 01/03/2015 8:45 AM

## 2015-01-03 NOTE — Consult Note (Addendum)
ELECTROPHYSIOLOGY CONSULT NOTE    Primary Care Physician: No primary care provider on file. Referring Physician:  Dr McLean  Admit Date: 12/31/2014  Reason for consultation:  Atrial flutter  Daniel Blankenship is a 58 y.o. male with a h/o CAD, DM, HTN, and reduced EF (presumed to be due to an ischemic CM).  He is admitted with acute on chronic decompensated CHF.  He is noted to have atrial flutter with RVR.   [facility] has also been hospitalized at [facility]. He has a known severe cardiomyopathy with LVEF less than 15%.  The patient apparently has a long history of medical noncompliance. He also has had uncontrolled diabetes. During a hospitalization in 2014, he was noted to have atrial flutter and the patient was cardioverted. He has undergone multivessel PCI in the past.  He has a SJM single chamber ICD implanted at [facility]. The patient reports worsening shortness of breath and weakness for several days. He's also had some chest discomfort. He describes swelling in his abdomen and legs. He reports no change in his diet. He denies any recent illness and specifically denies fevers, chills, or appetite change.  He is placed on milrinone.  He is also noted to have atrial flutter with RVR.   Past Medical History  Diagnosis Date  . Diabetes mellitus without complication   . Atrial flutter with rapid ventricular response   . Acute renal failure   . Vascular dementia   . CHF (congestive heart failure)   . Delirium   . Ischemic cardiomyopathy   . Septic shock   . Pneumonia   . CAD (coronary artery disease)   . Anemia   . Hypertension   . Hyperlipidemia   . Anxiety    Past Surgical History  Procedure Laterality Date  . Cardioversion    . Joint replacement    . Coronary angioplasty with stent placement    . Cardiac defibrillator placement      . antiseptic oral rinse  7 mL Mouth Rinse BID  . apixaban  5 mg Oral  BID  . aspirin  81 mg Oral Daily  . docusate sodium  200 mg Oral Daily  . furosemide  40 mg Intravenous 3 times per day  . hydrALAZINE  12.5 mg Oral 3 times per day  . insulin aspart  0-20 Units Subcutaneous TID WC  . insulin aspart  0-5 Units Subcutaneous QHS  . insulin glargine  30 Units Subcutaneous QHS  . isosorbide mononitrate  30 mg Oral Daily  . sodium chloride  10-40 mL Intracatheter Q12H   . amiodarone 30 mg/hr (01/03/15 0947)  . milrinone 0.25 mcg/kg/min (01/03/15 1417)    Allergies  Allergen Reactions  . Lisinopril   . Sulfa Antibiotics   . Zocor [Simvastatin]     History   Social History  . Marital Status: Single    Spouse Name: N/A  . Number of Children: N/A  . Years of Education: N/A   Occupational History  . Not on file.   Social History Main Topics  . Smoking status: Never Smoker   . Smokeless tobacco: Not on file  . Alcohol Use: No  . Drug Use: Not on file  . Sexual Activity: Not on file   Other Topics Concern  . Not on file   Social History Narrative    Family History  Problem Relation Age of Onset  . Family history unknown: Yes      ROS- All systems are reviewed and negative except as per the HPI above  Physical Exam: Telemetry: Filed Vitals:   01/03/15 1000 01/03/15 1100 01/03/15 1200 01/03/15 1300  BP: 95/52 95/56 100/58 91/73  Pulse: 120 115 102 103  Temp:  97.9 F (36.6 C)    TempSrc:  Oral    Resp: 20 26 18 24  Height:      Weight:      SpO2: 98% 96% 99% 100%    GEN- The patient is overweight and ill appearing, alert and oriented x 3 today.   Head- normocephalic, atraumatic Eyes-  Sclera clear, conjunctiva pink Ears- hearing intact Oropharynx- clear Neck- supple, CVL in place Lungs- deceased BS at the bases Heart- tachycardic irregular rhythm GI- soft, NT, ND, + BS Extremities- no clubbing, cyanosis, + dependant edema MS- no significant deformity or atrophy Skin- no rash or lesion Psych- euthymic mood, full  affect Neuro- strength and sensation are intact  EKG typical appearing atrial flutter  Labs:   Lab Results  Component Value Date   WBC 10.6* 01/03/2015   HGB 12.7* 01/03/2015   HCT 38.0* 01/03/2015   MCV 81.2 01/03/2015   PLT 234 01/03/2015    Recent Labs Lab 01/03/15 0400  NA 134*  K 3.4*  CL 101  CO2 21  BUN 50*  CREATININE 2.02*  CALCIUM 7.8*  PROT 4.7*  BILITOT 6.2*  ALKPHOS 227*  ALT 253*  AST 250*  GLUCOSE 128*   Lab Results  Component Value Date   TROPONINI 0.06* 01/01/2015   No results found for: CHOL No results found for: HDL No results found for: LDLCALC No results found for: TRIG No results found for: CHOLHDL No results found for: LDLDIRECT    ASSESSMENT AND PLAN:   1. Persistent typical appearing atrial flutter The patient presents with symptoms of CHF.  This is likely exacerbated by atrial flutter with RVR.  I agree with Dr McLean that the most prudent action would be ablation to minimize risk of recurrence, particularly given his prior noncompliance. Start eliquis Therapeutic strategies for atrial flutter including medicine and ablation were discussed in detail with the patient today. Risk, benefits, and alternatives to EP study and radiofrequency ablation were also discussed in detail today. These risks include but are not limited to stroke, bleeding, vascular damage, tamponade, perforation, damage to the heart and other structures, AV block requiring pacemaker, worsening renal function, and death. The patient understands these risk and wishes to proceed.  We will therefore proceed with catheter ablation at the next available time.  Proceed with tee guided ablation on Friday.  2. Acute on chronic systolic and diastolic dysfunction Will need further medicine optimization prior to ablation on Friday Will interrogate ICD prior to the ablation We may find that his EF improves with sinus rhythm.  I will be optimistic.  3. CAD No ischemic  symptoms   Itzayanna Kaster, MD 01/03/2015  4:18 PM    

## 2015-01-03 NOTE — Consult Note (Signed)
Referring Provider: Dr. Mitchel HonourP. Joseph ([facility]) Primary Care Physician:  No primary care provider on file. Primary Gastroenterologist:  None (unassigned)  Reason for Consultation:  Elevated liver chemistries. Abdominal pain.  HPI: Daniel Blankenship is a 59 y.o. male admitted to the hospital several days ago because of shortness of breath and leg swelling. After several days in the hospital, he had severe lower abdominal pain, across the lower abdomen, below the umbilicus. It was sharp and stabbing in character. It had an abrupt onset, but has subsequently gone away. As part of the evaluation of this, the patient had a CT scan which showed some hepatic steatosis but otherwise no acute changes, specifically no evidence of pancreatitis, bowel obstruction, or bowel edema. I don't believe there was any nausea or vomiting. The patient has not had fevers.  Meanwhile, it was noted that the patient's bilirubin on admission was 5.4, with transaminases in the 100-200 range. It has gone up slightly since then. Alkaline phosphatase is just mildly elevated at 144.   The patient's ultrasound showed some steatosis and gallbladder sludge but a normal size common bile duct.  The patient has severe cardiomyopathy with an estimated ejection fraction, by 2-D echo, of about 15%. He was moved to the coronary care unit. His shortness of breath on admission, however, has essentially resolved, he is not on oxygen, and is able to lie flat.  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 He says he drank heavily while in the [employer], but that was over 15 years ago. He does not recall being told he has any history of hepatitis, hepatitis C, or liver trouble. His liver did not look cirrhotic on either his CT or his ultrasound, and his platelet count is normal at 234,000, further suggesting the absence of cirrhosis. No ascites is present.  Associated with this, the patient reports some "dark yellow" urine, no diarrhea or rectal  bleeding. He may have had some "dark stools."   Past Medical History  Diagnosis Date  . Diabetes mellitus without complication   . Atrial flutter with rapid ventricular response   . Acute renal failure   . Vascular dementia   . CHF (congestive heart failure)   . Delirium   . Ischemic cardiomyopathy   . Septic shock   . Pneumonia   . CAD (coronary artery disease)   . Anemia   . Hypertension   . Hyperlipidemia   . Anxiety     Past Surgical History  Procedure Laterality Date  . Cardioversion    . Joint replacement    . Coronary angioplasty with stent placement    . Cardiac defibrillator placement      Prior to Admission medications   Medication Sig Start Date End Date Taking? Authorizing Provider  ALPRAZolam (NIRAVAM) 0.5 MG dissolvable tablet Take one tablet by mouth three times daily after meals. Hold for sedation. 04/05/13  Yes Sharon SellerJessica K Eubanks, NP  aspirin 81 MG tablet Take 81 mg by mouth daily.   Yes Historical Provider, MD  atorvastatin (LIPITOR) 80 MG tablet Take 40 mg by mouth daily.    Yes Historical Provider, MD  docusate sodium (COLACE) 100 MG capsule Take 200 mg by mouth daily.   Yes Historical Provider, MD  metoprolol tartrate (LOPRESSOR) 25 MG tablet Take 25 mg by mouth every 6 (six) hours.   Yes Historical Provider, MD  nitroGLYCERIN (NITRODUR - DOSED IN MG/24 HR) 0.4 mg/hr Place 1 patch onto the skin daily.   Yes Historical Provider, MD  oxyCODONE-acetaminophen (ROXICET) 5-325 MG per tablet Take 1 tablet by mouth every 6 (six) hours as needed for pain. 04/14/13  Yes Tiffany L Reed, DO    Current Facility-Administered Medications  Medication Dose Route Frequency Provider Last Rate Last Dose  . acetaminophen (TYLENOL) tablet 650 mg  650 mg Oral Q6H PRN Zannie Cove, MD      . ALPRAZolam Prudy Feeler) tablet 0.5 mg  0.5 mg Oral TID PRN Zannie Cove, MD   0.5 mg at 01/02/15 1058  . amiodarone (NEXTERONE PREMIX) 360 MG/200ML (1.8 mg/mL) IV infusion  30 mg/hr  Intravenous Continuous Tonny Bollman, MD 16.7 mL/hr at 01/03/15 0230 30 mg/hr at 01/03/15 0230  . amiodarone (NEXTERONE) IV bolus only 150 mg/100 mL  150 mg Intravenous Once Laurey Morale, MD      . antiseptic oral rinse (CPC / CETYLPYRIDINIUM CHLORIDE 0.05%) solution 7 mL  7 mL Mouth Rinse BID Hollice Espy, MD   7 mL at 01/02/15 2200  . apixaban (ELIQUIS) tablet 5 mg  5 mg Oral BID Laurey Morale, MD      . aspirin chewable tablet 81 mg  81 mg Oral Daily Zannie Cove, MD   81 mg at 01/02/15 1003  . docusate sodium (COLACE) capsule 200 mg  200 mg Oral Daily Zannie Cove, MD   200 mg at 01/01/15 1007  . furosemide (LASIX) injection 40 mg  40 mg Intravenous 3 times per day Laurey Morale, MD      . hydrALAZINE (APRESOLINE) tablet 12.5 mg  12.5 mg Oral 3 times per day Laurey Morale, MD      . insulin aspart (novoLOG) injection 0-20 Units  0-20 Units Subcutaneous TID WC Hollice Espy, MD   3 Units at 01/02/15 1212  . insulin aspart (novoLOG) injection 0-5 Units  0-5 Units Subcutaneous QHS Hollice Espy, MD   0 Units at 01/02/15 2200  . insulin glargine (LANTUS) injection 30 Units  30 Units Subcutaneous QHS Hollice Espy, MD   30 Units at 01/02/15 2307  . isosorbide mononitrate (IMDUR) 24 hr tablet 30 mg  30 mg Oral Daily Laurey Morale, MD      . milrinone [facility]) 20 MG/100ML (0.2 mg/mL) infusion  0.25 mcg/kg/min Intravenous Continuous Tonny Bollman, MD 10 mL/hr at 01/03/15 0402 0.25 mcg/kg/min at 01/03/15 0402  . ondansetron (ZOFRAN) injection 4 mg  4 mg Intravenous Q6H PRN Zannie Cove, MD      . oxyCODONE-acetaminophen (PERCOCET/ROXICET) 5-325 MG per tablet 1 tablet  1 tablet Oral Q4H PRN Leanne Chang, NP   1 tablet at 01/03/15 0245  . potassium chloride SA (K-DUR,KLOR-CON) CR tablet 40 mEq  40 mEq Oral Once Zannie Cove, MD      . sodium chloride 0.9 % injection 10-40 mL  10-40 mL Intracatheter Q12H Hollice Espy, MD   10 mL at 01/02/15 2300  . sodium  chloride 0.9 % injection 10-40 mL  10-40 mL Intracatheter PRN Hollice Espy, MD        Allergies as of 12/31/2014 - Review Complete 12/31/2014  Allergen Reaction Noted  . Lisinopril  04/21/2013  . Sulfa antibiotics  04/21/2013  . Zocor [simvastatin]  04/21/2013    Family History  Problem Relation Age of Onset  . Family history unknown: Yes    History   Social History  . Marital Status: Single    Spouse Name: N/A  . Number of Children: N/A  . Years of Education: N/A  Occupational History  . Not on file.   Social History Main Topics  . Smoking status: Never Smoker   . Smokeless tobacco: Not on file  . Alcohol Use: No  . Drug Use: Not on file  . Sexual Activity: Not on file   Other Topics Concern  . Not on file   Social History Narrative    Review of Systems: No orthopnea or current shortness of breath, but he did have some prior to admission. No chest pain. Recent leg swelling. No joint problems or skin problems. No persistent urinary difficulties. No dysphagia or heartburn. He thinks he has had significant weight loss over the past year (he used to be over 300 pounds).  Physical Exam: Vital signs in last 24 hours: Temp:  [97.3 F (36.3 C)-98.7 F (37.1 C)] 98 F (36.7 C) (03/23 0700) Pulse Rate:  [31-118] 112 (03/23 0700) Resp:  [16-26] 18 (03/23 0700) BP: (82-146)/(44-108) 111/67 mmHg (03/23 0700) SpO2:  [83 %-100 %] 98 % (03/23 0700) Weight:  [133.3 kg (293 lb 14 oz)] 133.3 kg (293 lb 14 oz) (03/23 0357) Last BM Date: 01/02/15 General:   Alert,  Well-developed, well-nourished, pleasant and cooperative in NAD Head:  Normocephalic and atraumatic. Eyes:  Sclera clear, no icterus (despite hyperbilirubinemia).   Conjunctiva pale. Mouth:   No ulcerations or lesions.  Oropharynx pink & moist. Neck:   No masses or thyromegaly. Lungs:  Clear throughout to auscultation.   No wheezes, crackles, or rhonchi. No evident respiratory distress. Heart:   Rapid rate but  regular rhythm; no murmurs, clicks, rubs,  or gallops. Abdomen:  Soft, nontender, nontympanitic, and nondistended. No masses, hepatosplenomegaly or ventral hernias noted. Normal bowel sounds, without bruits, guarding, or rebound.   Msk:   Symmetrical without gross deformities. Extremities:   Without clubbing, cyanosis, or significant pedal edema at this time. Neurologic:  Alert and coherent;  grossly normal neurologically. No obvious cranial nerve or focal motor deficits. Skin:  Intact without significant lesions or rashes. Cervical Nodes:  No significant cervical adenopathy. Psych:   Alert and cooperative. Normal mood and affect.  Intake/Output from previous day: 03/22 0701 - 03/23 0700 In: 1035 [P.O.:480; I.V.:555] Out: 2775 [Urine:2775] Intake/Output this shift:    Lab Results:  Recent Labs  12/31/14 1355 12/31/14 1418 01/02/15 1530 01/03/15 0350  WBC 12.4*  --  15.0* 10.6*  HGB 13.9 15.3 15.1 12.7*  HCT 43.1 45.0 44.6 38.0*  PLT 281  --  264 234   BMET  Recent Labs  01/01/15 0506 01/02/15 0539 01/03/15 0400  NA 132* 137 134*  K 4.3 4.0 3.4*  CL 101 101 101  CO2 16* 20 21  GLUCOSE 401* 99 128*  BUN 43* 52* 50*  CREATININE 2.01* 2.13* 2.02*  CALCIUM 8.0* 8.4 7.8*   LFT  Recent Labs  01/01/15 0506  PROT 5.8*  ALBUMIN 3.2*  AST 133*  ALT 183*  ALKPHOS 141*  BILITOT 6.9*   PT/INR  Recent Labs  01/02/15 1530  LABPROT 19.5*  INR 1.63*    Studies/Results: Ct Abdomen Pelvis Wo Contrast  01/03/2015   CLINICAL DATA:  Abdominal pain, elevated LFTs and liver failure. Nausea.  EXAM: CT ABDOMEN AND PELVIS WITHOUT CONTRAST  TECHNIQUE: Multidetector CT imaging of the abdomen and pelvis was performed following the standard protocol without IV contrast.  COMPARISON:  Abdominal ultrasound 01/01/2015  FINDINGS: Small right pleural effusion. The heart is enlarged. There is bibasilar atelectasis in the lower lobes.  Liver is prominent in  size with decreased density,  suspect steatosis. There is decreased density adjacent to the falciform ligament that may reflect more focal steatosis. The questioned lesion on ultrasound is not well seen, detailed evaluation limited given lack of intravenous contrast. The gallbladder is physiologically distended. Probable sludge in the gallbladder lumen. No pericholecystic fluid. Common bile duct is not well visualized.  Spleen is normal in size. Pancreas and adrenal glands are normal. There is no hydronephrosis or perinephric stranding. No renal stones. Suspect 1.3 cm cyst in the lower right kidney.  Stomach is decompressed. There are no dilated or thickened bowel loops. The appendix is not definitively identified. Small volume of stool throughout the colon. There is oral contrast throughout the colon.  The abdominal aorta is normal in caliber, densely calcified. No retroperitoneal adenopathy. No free air, free fluid, or intra-abdominal fluid collection. No ascites. There is a small to moderate fat containing umbilical hernia.  Within the pelvis the bladder is minimally distended. Prostate gland is normal in size. No pelvic free fluid or ascites. No pelvic adenopathy.  There is degenerative change in the lower lumbar spine. No acute osseous abnormality or focal osseous lesion.  IMPRESSION: 1. Decreased hepatic density, suspect hepatic steatosis. There is more focal fatty infiltration adjacent to the falciform ligament of the liver. The lesion on ultrasound is not seen without IV contrast, characterization recommended with MRI. 2. Probable sludge in the gallbladder. 3. Small right pleural effusion, new from prior exam. 4. Fat containing umbilical hernia.   Electronically Signed   By: Rubye Oaks M.D.   On: 01/03/2015 05:40   Dg Chest Port 1 View  01/02/2015   CLINICAL DATA:  Left central line insertion  EXAM: PORTABLE CHEST - 1 VIEW  COMPARISON:  12/31/2014  FINDINGS: New left IJ catheter, tip at the SVC level. No interval displacement of  the right sided pacer/ICD.  Increased hazy density of the lower chest is likely from soft tissue attenuation. There is ongoing pulmonary venous congestion. No pneumothorax.  IMPRESSION: 1. New left IJ catheter is in good position.  No pneumothorax. 2. Unchanged cardiomegaly and pulmonary venous congestion.   Electronically Signed   By: Marnee Spring M.D.   On: 01/02/2015 19:54    Impression: 1. Transient low abdominal pain of unclear cause, now resolved. I wonder if he had transient colonic ischemia, although if so, apparently not of sufficient duration to cause ischemic colitis since he has not had diarrhea, rectal bleeding, or colonic edema seen on today's CT scan 2. Elevated liver chemistries, gallbladder sludge. Doubt choledocholithiasis or acute gallbladder disease given resolution of pain, location of pain below the umbilicus, absence of tenderness on exam. The much more likely explanation for the elevated liver chemistries is some underlying hepatic steatosis, passive congestion related to heart failure, and "reactive hepatopathy" associated with the patient's acute medical illness.  Plan: 1. Check hepatitis C serology 2. Monitor labs and overall clinical status, but at this time, I do not anticipate further biliary tract evaluation. He cannot have an MRCP because of his implantable defibrillator, and I don't think that the patient is medically stable enough, nor sufficiently likely to have any biliary tract pathology, to justify putting him through an invasive procedure like an endoscopic ultrasound and/or ERCP.   LOS: 3 days   Aryahna Spagna V  01/03/2015, 9:23 AM

## 2015-01-03 NOTE — Progress Notes (Signed)
ANTICOAGULATION CONSULT NOTE  Pharmacy Consult for Heparin Indication: atrial fibrillation  Allergies  Allergen Reactions  . Lisinopril   . Sulfa Antibiotics   . Zocor [Simvastatin]     Patient Measurements: Height: 6\' 3"  (190.5 cm) Weight: 293 lb 14 oz (133.3 kg) IBW/kg (Calculated) : 84.5 Heparin Dosing Weight: 113 kg  Vital Signs: Temp: 97.3 F (36.3 C) (03/23 0405) Temp Source: Oral (03/23 0405) BP: 111/70 mmHg (03/23 0405) Pulse Rate: 113 (03/23 0405)  Labs:  Recent Labs  12/31/14 1355 12/31/14 1418 12/31/14 2100 01/01/15 0506 01/02/15 0539 01/02/15 1530 01/03/15 0350 01/03/15 0400  HGB 13.9 15.3  --   --   --  15.1 12.7*  --   HCT 43.1 45.0  --   --   --  44.6 38.0*  --   PLT 281  --   --   --   --  264 234  --   LABPROT  --   --   --   --   --  19.5*  --   --   INR  --   --   --   --   --  1.63*  --   --   HEPARINUNFRC  --   --   --   --   --   --   --  0.84*  CREATININE 1.80* 1.60*  --  2.01* 2.13*  --   --   --   TROPONINI  --   --  0.06* 0.06*  --   --   --   --     Estimated Creatinine Clearance: 54.9 mL/min (by C-G formula based on Cr of 2.13).  Assessment: 59 y.o. male with aflutter for heparin  Goal of Therapy:  Heparin level 0.3-0.7 units/ml Monitor platelets by anticoagulation protocol: Yes   Plan:  Decrease Heparin 1400 units/hr Check heparin level in 8 hours.  Geannie RisenGreg Suhas Estis, PharmD, BCPS  01/03/2015,5:23 AM

## 2015-01-03 NOTE — Progress Notes (Signed)
Patient ID: Daniel Blankenship, male   DOB: November 15, 1955, 59 y.o.   MRN: [id number]   SUBJECTIVE: Improved urine output overnight on milrinone.  HR 100s-120, atrial flutter.  Breathing improved a bit.  Creatinine stable.    Scheduled Meds: . amiodarone  150 mg Intravenous Once  . antiseptic oral rinse  7 mL Mouth Rinse BID  . apixaban  5 mg Oral BID  . aspirin  81 mg Oral Daily  . docusate sodium  200 mg Oral Daily  . furosemide  40 mg Intravenous 3 times per day  . hydrALAZINE  12.5 mg Oral 3 times per day  . insulin aspart  0-20 Units Subcutaneous TID WC  . insulin aspart  0-5 Units Subcutaneous QHS  . insulin glargine  30 Units Subcutaneous QHS  . isosorbide mononitrate  30 mg Oral Daily  . potassium chloride  40 mEq Oral Once  . sodium chloride  10-40 mL Intracatheter Q12H   Continuous Infusions: . amiodarone 30 mg/hr (01/03/15 0230)  . milrinone 0.25 mcg/kg/min (01/03/15 0402)   PRN Meds:.acetaminophen, ALPRAZolam, ondansetron (ZOFRAN) IV, oxyCODONE-acetaminophen, sodium chloride    Filed Vitals:   01/03/15 0405 01/03/15 0500 01/03/15 0600 01/03/15 0700  BP: 111/70 104/60 106/76 111/67  Pulse: 113 93 108 112  Temp: 97.3 F (36.3 C)   98 F (36.7 C)  TempSrc: Oral   Oral  Resp: 24 19 18 18   Height:      Weight:      SpO2: 98% 100% 98% 98%    Intake/Output Summary (Last 24 hours) at 01/03/15 0843 Last data filed at 01/03/15 0700  Gross per 24 hour  Intake 795.01 ml  Output   2425 ml  Net -1629.99 ml    LABS: Basic Metabolic Panel:  Recent Labs  58/85/0203/22/16 0539 01/03/15 0400  NA 137 134*  K 4.0 3.4*  CL 101 101  CO2 20 21  GLUCOSE 99 128*  BUN 52* 50*  CREATININE 2.13* 2.02*  CALCIUM 8.4 7.8*   Liver Function Tests:  Recent Labs  12/31/14 1355 01/01/15 0506  AST 95* 133*  ALT 177* 183*  ALKPHOS 152* 141*  BILITOT 5.4* 6.9*  PROT 6.1 5.8*  ALBUMIN 3.4* 3.2*   No results for input(s): LIPASE, AMYLASE in the last 72 hours. CBC:  Recent Labs  12/31/14 1355  01/02/15 1530 01/03/15 0350  WBC 12.4*  --  15.0* 10.6*  NEUTROABS 9.5*  --   --   --   HGB 13.9  < > 15.1 12.7*  HCT 43.1  < > 44.6 38.0*  MCV 83.4  --  84.0 81.2  PLT 281  --  264 234  < > = values in this interval not displayed. Cardiac Enzymes:  Recent Labs  12/31/14 2100 01/01/15 0506  TROPONINI 0.06* 0.06*   BNP: Invalid input(s): POCBNP D-Dimer:  Recent Labs  12/31/14 1355  DDIMER 0.60*   Hemoglobin A1C:  Recent Labs  12/31/14 2100  HGBA1C 12.9*   Fasting Lipid Panel: No results for input(s): CHOL, HDL, LDLCALC, TRIG, CHOLHDL, LDLDIRECT in the last 72 hours. Thyroid Function Tests: No results for input(s): TSH, T4TOTAL, T3FREE, THYROIDAB in the last 72 hours.  Invalid input(s): FREET3 Anemia Panel: No results for input(s): VITAMINB12, FOLATE, FERRITIN, TIBC, IRON, RETICCTPCT in the last 72 hours.  RADIOLOGY: Ct Abdomen Pelvis Wo Contrast  01/03/2015   CLINICAL DATA:  Abdominal pain, elevated LFTs and liver failure. Nausea.  EXAM: CT ABDOMEN AND PELVIS WITHOUT CONTRAST  TECHNIQUE: Multidetector  CT imaging of the abdomen and pelvis was performed following the standard protocol without IV contrast.  COMPARISON:  Abdominal ultrasound 01/01/2015  FINDINGS: Small right pleural effusion. The heart is enlarged. There is bibasilar atelectasis in the lower lobes.  Liver is prominent in size with decreased density, suspect steatosis. There is decreased density adjacent to the falciform ligament that may reflect more focal steatosis. The questioned lesion on ultrasound is not well seen, detailed evaluation limited given lack of intravenous contrast. The gallbladder is physiologically distended. Probable sludge in the gallbladder lumen. No pericholecystic fluid. Common bile duct is not well visualized.  Spleen is normal in size. Pancreas and adrenal glands are normal. There is no hydronephrosis or perinephric stranding. No renal stones. Suspect 1.3 cm cyst in  the lower right kidney.  Stomach is decompressed. There are no dilated or thickened bowel loops. The appendix is not definitively identified. Small volume of stool throughout the colon. There is oral contrast throughout the colon.  The abdominal aorta is normal in caliber, densely calcified. No retroperitoneal adenopathy. No free air, free fluid, or intra-abdominal fluid collection. No ascites. There is a small to moderate fat containing umbilical hernia.  Within the pelvis the bladder is minimally distended. Prostate gland is normal in size. No pelvic free fluid or ascites. No pelvic adenopathy.  There is degenerative change in the lower lumbar spine. No acute osseous abnormality or focal osseous lesion.  IMPRESSION: 1. Decreased hepatic density, suspect hepatic steatosis. There is more focal fatty infiltration adjacent to the falciform ligament of the liver. The lesion on ultrasound is not seen without IV contrast, characterization recommended with MRI. 2. Probable sludge in the gallbladder. 3. Small right pleural effusion, new from prior exam. 4. Fat containing umbilical hernia.   Electronically Signed   By: Rubye Oaks M.D.   On: 01/03/2015 05:40   Ct Angio Chest Pe W/cm &/or Wo Cm  12/31/2014   CLINICAL DATA:  Shortness of breath. Abdominal pain. Productive cough.  EXAM: CT ANGIOGRAPHY CHEST WITH CONTRAST  TECHNIQUE: Multidetector CT imaging of the chest was performed using the standard protocol during bolus administration of intravenous contrast. Multiplanar CT image reconstructions and MIPs were obtained to evaluate the vascular anatomy.  CONTRAST:  OMNIPAQUE IOHEXOL 350 MG/ML SOLN  COMPARISON:  Chest x-ray dated 12/31/2014  FINDINGS: There are no pulmonary emboli or infiltrates or effusions. There is a 5 mm peripheral nodule in the right lower lobe on image 58 of series 6. The patient has mediastinal and hilar adenopathy. There is a 13 mm node just to the left of the aortic arch on image 29,  and azygos node measuring 13 mm on image 33, and a prominent nodal mass measuring 6.6 x 2.7 x 4.8 cm in the subcarinal region. Part of the soft tissue density represents the esophagus but I think of the scar this is adenopathy.  There is cardiomegaly with dilatation of the left ventricle.  No osseous abnormality. The visualized portion of the upper abdomen is normal.  Review of the MIP images confirms the above findings.  IMPRESSION: 1. No pulmonary emboli. 2. Cardiomegaly with dilatation of the right ventricle. 3. Mediastinal and hilar adenopathy of unknown etiology. The possibility of malignancy should be considered. 4. 5 mm nodule in the periphery of the right lower lobe. This should be followed up in 6 months with chest CT without contrast if the adenopathy is not determined to be malignant.   Electronically Signed   By: Francene Boyers  M.D.   On: 12/31/2014 17:33   Dg Chest Port 1 View  01/02/2015   CLINICAL DATA:  Left central line insertion  EXAM: PORTABLE CHEST - 1 VIEW  COMPARISON:  12/31/2014  FINDINGS: New left IJ catheter, tip at the SVC level. No interval displacement of the right sided pacer/ICD.  Increased hazy density of the lower chest is likely from soft tissue attenuation. There is ongoing pulmonary venous congestion. No pneumothorax.  IMPRESSION: 1. New left IJ catheter is in good position.  No pneumothorax. 2. Unchanged cardiomegaly and pulmonary venous congestion.   Electronically Signed   By: Marnee Spring M.D.   On: 01/02/2015 19:54   Dg Chest Port 1 View  12/31/2014   CLINICAL DATA:  Shortness of breath and chest pain  EXAM: PORTABLE CHEST - 1 VIEW  COMPARISON:  None.  FINDINGS: Cardiac shadow is enlarged. A defibrillator is noted. No focal infiltrate or sizable effusion is seen.  IMPRESSION: No active disease.   Electronically Signed   By: Alcide Clever M.D.   On: 12/31/2014 14:14   US Abdomen Limited Ruq  01/01/2015   CLINICAL DATA:  Abnormal LFTs  EXAM: US ABDOMEN LIMITED - RIGHT  UPPER QUADRANT  COMPARISON:  None.  FINDINGS: Gallbladder:  There is intraluminal sludge. Gallbladder wall thickness is normal, 2.0 mm. The patient was not tender over the gallbladder.  Common bile duct:  Diameter: 5.2 mm, normal  Liver:  The liver is enlarged, over 20 cm craniocaudal. It is diffusely increased in echogenicity consistent with fatty infiltration. There is a 2.5 cm focal hyperechoic homogeneous circumscribed lesion which likely represents a benign cavernous hemangioma.  IMPRESSION: 1. Enlarged liver with abnormally increased echogenicity. Fatty infiltration or other liver disease can produce this appearance. 2. 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 MRI with contrast would conclusively characterize this abnormality.   Electronically Signed   By: Ellery Plunk M.D.   On: 01/01/2015 06:05    PHYSICAL EXAM General: NAD Neck: JVP 14-16 cm, no thyromegaly or thyroid nodule.  Lungs: Slight crackles at bases.  CV: Lateral PMI.  Heart tachy, irregular S1/S2, no def S3 but tachy, no murmur.  1+ ankle edema.   Abdomen: Soft, slight RUQ tenderness, no hepatosplenomegaly, no distention.  Neurologic: Alert and oriented x 3.  Psych: Normal affect. Extremities: No clubbing or cyanosis.   TELEMETRY: Reviewed telemetry pt in typical atrial flutter with mild RVR  ASSESSMENT AND PLAN: 59 yo with history of CAD s/p multivessel PCI, ischemic cardiomyopathy with systolic CHF, CKD, and atrial flutter presented with atrial flutter/RVR and acute on chronic systolic CHF.   1. Acute on chronic systolic CHF: Ischemic cardiomyopathy, EF 15% by echo this admission.  He developed gradually worsening edema and dyspnea so came to ER.  Since admission, was treated with Lasix without great UOP and creatinine rose.  He was started on milrinone last night due to concern for low output and sent to CCU, line was placed.  On  milrinone, his UOP has picked up and he says that he feels better.  Creatinine stably elevated.  - Continue milrinone 0.25 - Monitor CVP and will send off co-ox today.   - Increase Lasix to 40 mg IV tid, better UOP on milrinone.  - Stop metoprolol for now.  - Add hydralazine 12.5 mg tid + Imdur 30 daily.  - Will need formal RHC after diuresis prior to discharge.  As he  will be anticoagulated, would try to do this from right brachial.  - He was evaluated for advanced therapies 2 years ago at [facility] and deemed to be a poor candidate (apparently compliance issues and lives alone).  He has children but they do not live with him, not sure he would have much help at home.  We will need to consider his candidacy for LVAD/transplant going forward.   2. Atrial flutter: Patient has history of atrial flutter, was cardioverted at [facility] in 2014.  He appears to have typical flutter with mild RVR.   - Amiodarone gtt - Stop heparin gtt, start Eliquis 5 mg bid. Discuss with EP regarding definitive atrial flutter ablation this admission.  If not, will need TEE-DCCV.  3. AKI on CKD: Creatinine up from baseline, cardiorenal component likely but also got contrast with CTA chest initially so may be component of contrast-mediated nephropathy.  4. Elevated LFTs: Suspect congestive hepatopathy.  Will follow LFTs.  Need to check HCV.  5. CAD: h/o PCI.  Slight troponin elevation, suspect demand ischemia from CHF.  If LFTs falling tomorrow, will start back on statin.   45 minutes critical care time  Marca Ancona 01/03/2015 8:56 AM \

## 2015-01-03 NOTE — Progress Notes (Addendum)
PROGRESS NOTE  Daniel Blankenship ZOX:[id number] DOB: 04/23/1956 DOA: 12/31/2014 PCP: No primary care provider on file.  HPI/Recap of past 64 hours: 59 year old male with past mental history of systolic heart failure with cardiomyopathy status post [name], diabetes mellitus with secondary renal disease and medical noncompliance admitted on 3/20 with complaints of leg swelling, abdominal pain and shortness of breath. He is also found to be markedly hyperglycemic as well as in acute heart failure. That time he was noted to have mildly elevated LFTs. Patient made it to the hospitalist service. Abdominal ultrasound done on admission noted some gallbladder sludge.  On hospital day 2, patient started to feel little bit better. Diuresed some. CBG still remain markedly elevated.  By hospital day 3 initially, patient had done well with diuresis of a few liters and sugars more controlled. That afternoon however, he started developing severe 10/10 abdominal pain. In review of his admission labs it was discovered that he had markedly elevated bilirubin at 5.4 which increased to 6.9. LFTs were only minimally elevated. Follow-up full liver function studies were ordered including hepatitis panel, coags, lipase and repeat labs as well as a stat CT scan of the abdomen and pelvis without contrast. Unfortunately, patient lost IV access and there were unable to get new peripheral IV. In addition echocardiogram was done to assess his ejection fraction and patient was found to have ejection fraction of 15%. Critical care consulted who placed central line. Cardiology consulted and patient moved to [facility] ICU and started on milrinone drip.  Assessment/Plan:  1. Acute on chronic Systolic CHF -poor output state, Ef 15% on echo -Appreciate Cards input -continue LAsix and milrinone per [facility] -felt to be a poor candidate for advanced therapies 2years ago at [facility]  2. Abdominal pain:  -unclear etiology, likely due to poor output  state -improving, tolerating diet, CT of abd unremarkable -abnormal LFTs likely due to passive hepatic congestion  3. Abnormal LFTs -suspect due to CHf and congestive hepatomegaly -CT without obstruction,  -Hep C ab pending  4. DM type 2, uncontrolled,  -stable now, continue lantus, SSI  5. CKD (chronic kidney disease), stage III:  -creatinine slightly higher with diuresis, monitor  6. Obesity (BMI 30-39.9): Patient is criteria with BMI greater than 30  7. Essential hypertension: -stable  8. Elevated troponin: Stable 0.06, not ACS and more likely in the setting of renal failure and heart failure  Code Status: Full code Family Communication: no family at bedside, dr. [place] Pink met with family yetserday  Disposition Plan: Keep in ICU  Consultants:  None  Procedures:  Central line placed by critical care  Echocardiogram done 3/22: Ejection fraction 15%, elevated diastolic dysfunction  Antibiotics:  None   Objective: BP 100/58 mmHg  Pulse 102  Temp(Src) 97.9 F (36.6 C) (Oral)  Resp 18  Ht 6' 3"  (1.905 m)  Wt 133.3 kg (293 lb 14 oz)  BMI 36.73 kg/m2  SpO2 99%  Intake/Output Summary (Last 24 hours) at 01/03/15 1328 Last data filed at 01/03/15 1100  Gross per 24 hour  Intake 1883.52 ml  Output   2825 ml  Net -941.48 ml   Filed Weights   01/01/15 0557 01/02/15 0329 01/03/15 0357  Weight: 132.5 kg (292 lb 1.8 oz) 133.5 kg (294 lb 5 oz) 133.3 kg (293 lb 14 oz)    Exam:   General:  alert and oriented 3, no acute distress  Cardiovascular: irregular rhythm, borderline tachycardia  Respiratory: Clear to auscultation bilaterally  Abdomen: Soft, nontender,  nondistended, positive bowel sounds  Musculoskeletal: Clubbing or cyanosis, 1+ pitting edema from the knees down    Data Reviewed: Basic Metabolic Panel:  Recent Labs Lab 12/31/14 1355 12/31/14 1418 01/01/15 0506 01/02/15 0539 01/03/15 0400  NA 133* 133* 132* 137 134*  K 5.1 5.3* 4.3 4.0  3.4*  CL 99 100 101 101 101  CO2 18*  --  16* 20 21  GLUCOSE 409* 422* 401* 99 128*  BUN 40* 41* 43* 52* 50*  CREATININE 1.80* 1.60* 2.01* 2.13* 2.02*  CALCIUM 8.6  --  8.0* 8.4 7.8*   Liver Function Tests:  Recent Labs Lab 12/31/14 1355 01/01/15 0506 01/03/15 0400  AST 95* 133* 250*  ALT 177* 183* 253*  ALKPHOS 152* 141* 227*  BILITOT 5.4* 6.9* 6.2*  PROT 6.1 5.8* 4.7*  ALBUMIN 3.4* 3.2* 2.9*   No results for input(s): LIPASE, AMYLASE in the last 168 hours. No results for input(s): AMMONIA in the last 168 hours. CBC:  Recent Labs Lab 12/31/14 1355 12/31/14 1418 01/02/15 1530 01/03/15 0350  WBC 12.4*  --  15.0* 10.6*  NEUTROABS 9.5*  --   --   --   HGB 13.9 15.3 15.1 12.7*  HCT 43.1 45.0 44.6 38.0*  MCV 83.4  --  84.0 81.2  PLT 281  --  264 234   Cardiac Enzymes:    Recent Labs Lab 12/31/14 2100 01/01/15 0506  TROPONINI 0.06* 0.06*   BNP (last 3 results)  Recent Labs  12/31/14 1430  BNP 386.4*    ProBNP (last 3 results) No results for input(s): PROBNP in the last 8760 hours.  CBG:  Recent Labs Lab 01/02/15 1557 01/02/15 2140 01/03/15 0705 01/03/15 0723 01/03/15 1137  GLUCAP 98 126* 130* 118* 194*    Recent Results (from the past 240 hour(s))  MRSA PCR Screening     Status: None   Collection Time: 01/02/15  7:14 PM  Result Value Ref Range Status   MRSA by PCR NEGATIVE NEGATIVE Final    Comment:        The GeneXpert MRSA Assay (FDA approved for NASAL specimens only), is one component of a comprehensive MRSA colonization surveillance program. It is not intended to diagnose MRSA infection nor to guide or monitor treatment for MRSA infections.      Studies: Dg Chest Port 1 View  01/02/2015   CLINICAL DATA:  Left central line insertion  EXAM: PORTABLE CHEST - 1 VIEW  COMPARISON:  12/31/2014  FINDINGS: New left IJ catheter, tip at the SVC level. No interval displacement of the right sided pacer/ICD.  Increased hazy density of the  lower chest is likely from soft tissue attenuation. There is ongoing pulmonary venous congestion. No pneumothorax.  IMPRESSION: 1. New left IJ catheter is in good position.  No pneumothorax. 2. Unchanged cardiomegaly and pulmonary venous congestion.   Electronically Signed   By: Monte Fantasia M.D.   On: 01/02/2015 19:54    Scheduled Meds: . antiseptic oral rinse  7 mL Mouth Rinse BID  . apixaban  5 mg Oral BID  . aspirin  81 mg Oral Daily  . docusate sodium  200 mg Oral Daily  . furosemide  40 mg Intravenous 3 times per day  . hydrALAZINE  12.5 mg Oral 3 times per day  . insulin aspart  0-20 Units Subcutaneous TID WC  . insulin aspart  0-5 Units Subcutaneous QHS  . insulin glargine  30 Units Subcutaneous QHS  . isosorbide mononitrate  30 mg  Oral Daily  . sodium chloride  10-40 mL Intracatheter Q12H    Continuous Infusions: . amiodarone 30 mg/hr (01/03/15 0947)  . milrinone 0.25 mcg/kg/min (01/03/15 0402)     Time spent: 31mn  Shota Kohrs  [facility] Pager [phone] If 7PM-7AM, please contact night-coverage at [email or web address], password [email or web address]3/23/2016, 1:28 PM  LOS: 3 days

## 2015-01-03 NOTE — Care Management Note (Addendum)
Page 1 of 2   01/11/2015     2:39:42 PM CARE MANAGEMENT NOTE 01/11/2015  Patient:  Daniel Blankenship, Daniel Blankenship   Account Number:  [id number]  Date Initiated:  01/03/2015  Documentation initiated by:  Daniel Blankenship  Subjective/Objective Assessment:   adm w heart failure     Action/Plan:   lives alone   Anticipated DC Date:  01/10/2015   Anticipated DC Plan:  HOME W HOME HEALTH SERVICES      DC Planning Services  CM consult  Medication Assistance      [facility] Choice  DURABLE MEDICAL EQUIPMENT  HOME HEALTH   Choice offered to / List presented to:  C-1 Patient   DME arranged  IV PUMP/EQUIPMENT      DME agency  [facility]     [facility] arranged  HH-1 RN  HH-10 DISEASE MANAGEMENT      HH agency  [facility]   Status of service:  Completed, signed off Medicare Important Message given?  YES (If response is "NO", the following Medicare IM given date fields will be blank) Date Medicare IM given:  01/03/2015 Medicare IM given by:  Daniel Blankenship Date Additional Medicare IM given:  01/11/2015 Additional Medicare IM given by:  Daniel Blankenship  Discharge Disposition:  HOME [facility] SERVICES  Per UR Regulation:  Reviewed for med. necessity/level of care/duration of stay  If discussed at Long Length of Stay Meetings, dates discussed:   01/09/2015  01/11/2015    Comments:  Daniel Blankenship (daughter)- [phone] 3/30 1035a Daniel dowell rn,bsn [facility] ready for home milrinone whenever md feels med stable.  3/29 1020 Daniel dowell rn,bsn spoke w pt. went over hhc agency list. no pref. ref to donna and pam w [facility] for home iv milrinone. will cont to follow.  3/28 1508 Daniel dowell rn,bsn [facility] called and request h&p and dc summary when disch faxed to Daniel Blankenship at [phone], can fax prescriptons to [phone]. will cont to follow.  3/23 1147 Daniel dowell rn.bsn spoke w pt. he has medicaid and [facility] for meds. eliquis on preferred medicaid list so copay  around 3.00 per month. gave pt 30day free eliquis card.  01/02/15- 1600- Daniel Pierini RN, BSN [phone] [address] [facility] pt's daughter called again regarding POA papers- she has found out that there are copies of paperwork at [facility]- requesting that fax be sent to [facility]- requesting copies of POA papers so that they can fax them here to have on file- number given to fax to at [facility] is [phone]- have had Dr. Rito Ehrlich sign fax requesting POA papers be sent STAT due to pt's unstable medical condition. faxed per daughters request to given #.  01/02/15- 1100- Daniel Pierini RN, BSN [phone] Have received return call from [place]- spoke with Daniel Blankenship- pt is in [place] system but is unassigned currently- will need to be assigned again here in [place]- message left for  [employer] (ext. [phone]) at the [facility] regarding getting pt assigned a PCP- received return call from [place]- who states that the pt will need to f/u with ext. [phone] and update [facility] info with [facility] for reass[facility]- also called the release of information about POA paperwork and spoke with Daniel Blankenship- per the [place] they do not have any POA papers on file to fax. Spoke with pt's daughter Daniel Blankenship - updated her on [place] situation regarding POA papers and PCP assignment- she will f/u on this along with pt's PCP assignment with the [facility] as pt  is unassigned currently  01/01/15-  1500- Daniel Blankenship- [phone] Call received from pt's daughter Ave FilterMichelle Blankenship who [place] in [place] she states that she is pt's POA and that [facility] has [facility] - [place] [facility] requesting them to send this [place] to have on file here- [place] # [place] release of information is [phone]- request sent along with call made to [place] and message left for Daniel Blankenship- awaiting return call from [place].

## 2015-01-04 LAB — CBC
HEMATOCRIT: 35.8 % — AB (ref 39.0–52.0)
HEMOGLOBIN: 11.8 g/dL — AB (ref 13.0–17.0)
MCH: 27.1 pg (ref 26.0–34.0)
MCHC: 33 g/dL (ref 30.0–36.0)
MCV: 82.3 fL (ref 78.0–100.0)
Platelets: 141 10*3/uL — ABNORMAL LOW (ref 150–400)
RBC: 4.35 MIL/uL (ref 4.22–5.81)
RDW: 16.6 % — ABNORMAL HIGH (ref 11.5–15.5)
WBC: 9.4 10*3/uL (ref 4.0–10.5)

## 2015-01-04 LAB — GLUCOSE, CAPILLARY
GLUCOSE-CAPILLARY: 118 mg/dL — AB (ref 70–99)
Glucose-Capillary: 229 mg/dL — ABNORMAL HIGH (ref 70–99)
Glucose-Capillary: 206 mg/dL — ABNORMAL HIGH (ref 70–99)

## 2015-01-04 LAB — CARBOXYHEMOGLOBIN
Carboxyhemoglobin: 1.8 % — ABNORMAL HIGH (ref 0.5–1.5)
Methemoglobin: 0.8 % (ref 0.0–1.5)
O2 Saturation: 67.7 %
Total hemoglobin: 12.1 g/dL — ABNORMAL LOW (ref 13.5–18.0)

## 2015-01-04 LAB — BASIC METABOLIC PANEL
ANION GAP: 11 (ref 5–15)
Anion gap: 9 (ref 5–15)
BUN: 29 mg/dL — AB (ref 6–23)
BUN: 25 mg/dL — ABNORMAL HIGH (ref 6–23)
CALCIUM: 7.5 mg/dL — AB (ref 8.4–10.5)
CHLORIDE: 101 mmol/L (ref 96–112)
CO2: 23 mmol/L (ref 19–32)
CO2: 23 mmol/L (ref 19–32)
CREATININE: 1.59 mg/dL — AB (ref 0.50–1.35)
CREATININE: 1.72 mg/dL — AB (ref 0.50–1.35)
Calcium: 7.5 mg/dL — ABNORMAL LOW (ref 8.4–10.5)
Chloride: 101 mmol/L (ref 96–112)
GFR calc Af Amer: 53 mL/min — ABNORMAL LOW (ref >90)
GFR calc non Af Amer: 42 mL/min — ABNORMAL LOW (ref >90)
GFR, EST AFRICAN AMERICAN: 48 mL/min — AB (ref >90)
GFR, EST NON AFRICAN AMERICAN: 46 mL/min — AB (ref >90)
Glucose, Bld: 276 mg/dL — ABNORMAL HIGH (ref 70–99)
Glucose, Bld: 222 mg/dL — ABNORMAL HIGH (ref 70–99)
POTASSIUM: 3.8 mmol/L (ref 3.5–5.1)
Potassium: 3.8 mmol/L (ref 3.5–5.1)
Sodium: 135 mmol/L (ref 135–145)
Sodium: 133 mmol/L — ABNORMAL LOW (ref 135–145)

## 2015-01-04 LAB — COMPREHENSIVE METABOLIC PANEL
ALBUMIN: 2.6 g/dL — AB (ref 3.5–5.2)
ALK PHOS: 194 U/L — AB (ref 39–117)
ALT: 282 U/L — ABNORMAL HIGH (ref 0–53)
AST: 249 U/L — AB (ref 0–37)
Anion gap: 9 (ref 5–15)
BILIRUBIN TOTAL: 2.9 mg/dL — AB (ref 0.3–1.2)
BUN: 33 mg/dL — AB (ref 6–23)
CHLORIDE: 102 mmol/L (ref 96–112)
CO2: 23 mmol/L (ref 19–32)
Calcium: 7.3 mg/dL — ABNORMAL LOW (ref 8.4–10.5)
Creatinine, Ser: 1.74 mg/dL — ABNORMAL HIGH (ref 0.50–1.35)
GFR calc Af Amer: 48 mL/min — ABNORMAL LOW (ref >90)
GFR, EST NON AFRICAN AMERICAN: 41 mL/min — AB (ref >90)
Glucose, Bld: 113 mg/dL — ABNORMAL HIGH (ref 70–99)
Potassium: 2.9 mmol/L — ABNORMAL LOW (ref 3.5–5.1)
Sodium: 134 mmol/L — ABNORMAL LOW (ref 135–145)
Total Protein: 4.7 g/dL — ABNORMAL LOW (ref 6.0–8.3)

## 2015-01-04 LAB — HEPATITIS C ANTIBODY: HCV Ab: NEGATIVE

## 2015-01-04 MED ORDER — POTASSIUM CHLORIDE CRYS ER 20 MEQ PO TBCR
40.0000 meq | EXTENDED_RELEASE_TABLET | Freq: Once | ORAL | Status: AC
  Administered 2015-01-04: 40 meq via ORAL
  Filled 2015-01-04: qty 2

## 2015-01-04 MED ORDER — POTASSIUM CHLORIDE CRYS ER 20 MEQ PO TBCR
40.0000 meq | EXTENDED_RELEASE_TABLET | Freq: Two times a day (BID) | ORAL | Status: DC
  Administered 2015-01-04: 40 meq via ORAL
  Filled 2015-01-04 (×4): qty 2

## 2015-01-04 MED ORDER — METOLAZONE 2.5 MG PO TABS
2.5000 mg | ORAL_TABLET | Freq: Once | ORAL | Status: AC
  Administered 2015-01-04: 2.5 mg via ORAL
  Filled 2015-01-04: qty 1

## 2015-01-04 MED ORDER — FUROSEMIDE 10 MG/ML IJ SOLN
10.0000 mg/h | INTRAVENOUS | Status: DC
  Administered 2015-01-04: 10 mg/h via INTRAVENOUS
  Filled 2015-01-04: qty 25

## 2015-01-04 MED ORDER — SPIRONOLACTONE 12.5 MG HALF TABLET
12.5000 mg | ORAL_TABLET | Freq: Every day | ORAL | Status: DC
  Administered 2015-01-04 – 2015-01-08 (×4): 12.5 mg via ORAL
  Filled 2015-01-04 (×6): qty 1

## 2015-01-04 MED ORDER — SODIUM CHLORIDE 0.9 % IV SOLN
INTRAVENOUS | Status: DC
  Administered 2015-01-05: via INTRAVENOUS

## 2015-01-04 MED ORDER — FUROSEMIDE 10 MG/ML IJ SOLN
12.5000 mg/h | INTRAVENOUS | Status: DC
  Filled 2015-01-04: qty 25

## 2015-01-04 MED ORDER — POTASSIUM CHLORIDE CRYS ER 20 MEQ PO TBCR
40.0000 meq | EXTENDED_RELEASE_TABLET | Freq: Two times a day (BID) | ORAL | Status: AC
  Administered 2015-01-04 (×2): 40 meq via ORAL
  Filled 2015-01-04: qty 2

## 2015-01-04 MED ORDER — DIGOXIN 125 MCG PO TABS
0.1250 mg | ORAL_TABLET | Freq: Every day | ORAL | Status: DC
  Administered 2015-01-04 – 2015-01-12 (×8): 0.125 mg via ORAL
  Filled 2015-01-04 (×9): qty 1

## 2015-01-04 NOTE — Progress Notes (Signed)
Notified Boyce MediciBrittany Simmons of patient's creatinine.  No new orders at this time.

## 2015-01-04 NOTE — Progress Notes (Signed)
PROGRESS NOTE  Daniel Blankenship ZOX:[id number] DOB: January 25, 1956 DOA: 12/31/2014 PCP: No primary care provider on file.  HPI/Recap of past 8924 hours: 59 year old male with past mental history of systolic heart failure with cardiomyopathy status post [name], diabetes mellitus with secondary renal disease and medical noncompliance admitted on 3/20 with complaints of leg swelling, abdominal pain and shortness of breath. He is also found to be markedly hyperglycemic as well as in acute heart failure. That time he was noted to have mildly elevated LFTs. Patient made it to the hospitalist service. Abdominal ultrasound done on admission noted some gallbladder sludge.  On hospital day 2, patient started to feel little bit better. Diuresed some. CBG still remain markedly elevated.  By hospital day 3 initially, patient had done well with diuresis of a few liters and sugars more controlled. That afternoon however, he started developing severe 10/10 abdominal pain. In review of his admission labs it was discovered that he had markedly elevated bilirubin at 5.4 which increased to 6.9. LFTs were only minimally elevated. Follow-up full liver function studies were ordered including hepatitis panel, coags, lipase and repeat labs as well as a stat CT scan of the abdomen and pelvis without contrast. Unfortunately, patient lost IV access and there were unable to get new peripheral IV. In addition echocardiogram was done to assess his ejection fraction and patient was found to have ejection fraction of 15%. Critical care consulted who placed central line. Cardiology consulted and patient moved to [facility] ICU and started on milrinone drip.  Assessment/Plan:  1. Acute on chronic Systolic CHF -poor output state, Ef 15% on echo -Appreciate Cards input -continue LAsix and milrinone per CArds -negative 2.3L -felt to be a poor candidate for advanced therapies 2years ago at [facility]  2. Abdominal pain:  -unclear etiology, likely due  to poor output state -improving, tolerating diet, CT of abd unremarkable -abnormal LFTs likely due to passive hepatic congestion  3. Abnormal LFTs -suspect due to CHf and congestive hepatomegaly -CT without obstruction,  -Hep C ab pending -appreciate Gi input  4. DM type 2, uncontrolled,  -stable now, continue lantus, SSI  5. CKD (chronic kidney disease), stage III:  -creatinine slightly higher with diuresis, monitor  6. Obesity (BMI 30-39.9): Patient is criteria with BMI greater than 30  7. Essential hypertension: -stable  8. Elevated troponin: Stable 0.06, not ACS and more likely in the setting of renal failure and heart failure  Code Status: Full code Family Communication: no family at bedside, will attempt to contact daughter  Disposition Plan: Keep in ICU  Consultants:  None  Procedures:  Central line placed by critical care  Echocardiogram done 3/22: Ejection fraction 15%, elevated diastolic dysfunction  Antibiotics:  None   Objective: BP 86/64 mmHg  Pulse 110  Temp(Src) 97.8 F (36.6 C) (Oral)  Resp 17  Ht 6\' 3"  (1.905 m)  Wt 133.2 kg (293 lb 10.4 oz)  BMI 36.70 kg/m2  SpO2 96%  Intake/Output Summary (Last 24 hours) at 01/04/15 1129 Last data filed at 01/04/15 1000  Gross per 24 hour  Intake 1549.93 ml  Output   1825 ml  Net -275.07 ml   Filed Weights   01/02/15 0329 01/03/15 0357 01/04/15 0410  Weight: 133.5 kg (294 lb 5 oz) 133.3 kg (293 lb 14 oz) 133.2 kg (293 lb 10.4 oz)    Exam:   General:  alert and oriented 3, no acute distress  Cardiovascular: irregular rhythm, borderline tachycardia  Respiratory: Clear to auscultation bilaterally  Abdomen: Soft, nontender, nondistended, positive bowel sounds  Musculoskeletal: Clubbing or cyanosis, 1+ pitting edema from the knees down    Data Reviewed: Basic Metabolic Panel:  Recent Labs Lab 12/31/14 1355 12/31/14 1418 01/01/15 0506 01/02/15 0539 01/03/15 0400 01/04/15 0440  NA  133* 133* 132* 137 134* 134*  K 5.1 5.3* 4.3 4.0 3.4* 2.9*  CL 99 100 101 101 101 102  CO2 18*  --  16* GLUCOSE 409* 422* 401* 99 128* 113*  BUN 40* 41* 43* 52* 50* 33*  CREATININE 1.80* 1.60* 2.01* 2.13* 2.02* 1.74*  CALCIUM 8.6  --  8.0* 8.4 7.8* 7.3*   Liver Function Tests:  Recent Labs Lab 12/31/14 1355 01/01/15 0506 01/03/15 0400 01/04/15 0440  AST 95* 133* 250* 249*  ALT 177* 183* 253* 282*  ALKPHOS 152* 141* 227* 194*  BILITOT 5.4* 6.9* 6.2* 2.9*  PROT 6.1 5.8* 4.7* 4.7*  ALBUMIN 3.4* 3.2* 2.9* 2.6*    Recent Labs Lab 01/03/15 0830  LIPASE 28   No results for input(s): AMMONIA in the last 168 hours. CBC:  Recent Labs Lab 12/31/14 1355 12/31/14 1418 01/02/15 1530 01/03/15 0350 01/04/15 0440  WBC 12.4*  --  15.0* 10.6* 9.4  NEUTROABS 9.5*  --   --   --   --   HGB 13.9 15.3 15.1 12.7* 11.8*  HCT 43.1 45.0 44.6 38.0* 35.8*  MCV 83.4  --  84.0 81.2 82.3  PLT 281  --  264 234 141*   Cardiac Enzymes:    Recent Labs Lab 12/31/14 2100 01/01/15 0506  TROPONINI 0.06* 0.06*   BNP (last 3 results)  Recent Labs  12/31/14 1430  BNP 386.4*    ProBNP (last 3 results) No results for input(s): PROBNP in the last 8760 hours.  CBG:  Recent Labs Lab 01/03/15 0723 01/03/15 1137 01/03/15 1635 01/03/15 2133 01/04/15 0733  GLUCAP 118* 194* 180* 146* 118*    Recent Results (from the past 240 hour(s))  MRSA PCR Screening     Status: None   Collection Time: 01/02/15  7:14 PM  Result Value Ref Range Status   MRSA by PCR NEGATIVE NEGATIVE Final    Comment:        The GeneXpert MRSA Assay (FDA approved for NASAL specimens only), is one component of a comprehensive MRSA colonization surveillance program. It is not intended to diagnose MRSA infection nor to guide or monitor treatment for MRSA infections.      Studies: Ct Abdomen Pelvis Wo Contrast  01/03/2015   CLINICAL DATA:  Abdominal pain, elevated LFTs and liver failure. Nausea.   EXAM: CT ABDOMEN AND PELVIS WITHOUT CONTRAST  TECHNIQUE: Multidetector CT imaging of the abdomen and pelvis was performed following the standard protocol without IV contrast.  COMPARISON:  Abdominal ultrasound 01/01/2015  FINDINGS: Small right pleural effusion. The heart is enlarged. There is bibasilar atelectasis in the lower lobes.  Liver is prominent in size with decreased density, suspect steatosis. There is decreased density adjacent to the falciform ligament that may reflect more focal steatosis. The questioned lesion on ultrasound is not well seen, detailed evaluation limited given lack of intravenous contrast. The gallbladder is physiologically distended. Probable sludge in the gallbladder lumen. No pericholecystic fluid. Common bile duct is not well visualized.  Spleen is normal in size. Pancreas and adrenal glands are normal. There is no hydronephrosis or perinephric stranding. No renal stones. Suspect 1.3 cm cyst in the lower right kidney.  Stomach is decompressed. There  are no dilated or thickened bowel loops. The appendix is not definitively identified. Small volume of stool throughout the colon. There is oral contrast throughout the colon.  The abdominal aorta is normal in caliber, densely calcified. No retroperitoneal adenopathy. No free air, free fluid, or intra-abdominal fluid collection. No ascites. There is a small to moderate fat containing umbilical hernia.  Within the pelvis the bladder is minimally distended. Prostate gland is normal in size. No pelvic free fluid or ascites. No pelvic adenopathy.  There is degenerative change in the lower lumbar spine. No acute osseous abnormality or focal osseous lesion.  IMPRESSION: 1. Decreased hepatic density, suspect hepatic steatosis. There is more focal fatty infiltration adjacent to the falciform ligament of the liver. The lesion on ultrasound is not seen without IV contrast, characterization recommended with MRI. 2. Probable sludge in the gallbladder.  3. Small right pleural effusion, new from prior exam. 4. Fat containing umbilical hernia.   Electronically Signed   By: Rubye Oaks M.D.   On: 01/03/2015 05:40    Scheduled Meds: . antiseptic oral rinse  7 mL Mouth Rinse BID  . apixaban  5 mg Oral BID  . digoxin  0.125 mg Oral Daily  . docusate sodium  200 mg Oral Daily  . hydrALAZINE  12.5 mg Oral 3 times per day  . insulin aspart  0-20 Units Subcutaneous TID WC  . insulin aspart  0-5 Units Subcutaneous QHS  . insulin glargine  30 Units Subcutaneous QHS  . isosorbide mononitrate  30 mg Oral Daily  . potassium chloride  40 mEq Oral BID  . sodium chloride  10-40 mL Intracatheter Q12H  . spironolactone  12.5 mg Oral Daily    Continuous Infusions: . amiodarone 60 mg/hr (01/04/15 1041)  . furosemide (LASIX) infusion 10 mg/hr (01/04/15 0825)  . milrinone 0.25 mcg/kg/min (01/04/15 0932)     Time spent:  [facility]  [facility] Pager [phone]. If 7PM-7AM, please contact night-coverage at [email or web address], password [facility] 01/04/2015, 11:29 AM  LOS: 4 days

## 2015-01-04 NOTE — Progress Notes (Signed)
No further abd pn.  Feels globally better.  Bilirbubin significantly improved (6.9 to 2.9) but transaminases basically hovering in the 250 range.  Hep C Ab pending.  Plan:  1.  Will follow at a distance with you; call any time if more immediate input from us is desired.  2.  Will check for alternative causes of elevated LFT's--for example, hemachromatosis could cause cardiomyopathy as well as liver disease, so will check ferritin; also ANA for autoimmune hepatitis (doubt).  Also Hep B sAg for completeness.  Florencia Reasonsobert V. Braxtyn Bojarski, M.D. [phone]

## 2015-01-04 NOTE — Progress Notes (Signed)
K+ 2.9. Paged Dr. Debera LatX2. At 0600 and 0615. Waiting on call back

## 2015-01-04 NOTE — Progress Notes (Signed)
Patient ID: Daniel Blankenship, male   DOB: 1956/02/07, 59 y.o.   MRN: [id number]   SUBJECTIVE: UOP not vigorous yesterday and weight stable.  However, creatinine down and he is feeling better and is more alert.  HR 100s-120, atrial flutter.  Co-ox 68%, CVP 12.   Scheduled Meds: . antiseptic oral rinse  7 mL Mouth Rinse BID  . apixaban  5 mg Oral BID  . digoxin  0.125 mg Oral Daily  . docusate sodium  200 mg Oral Daily  . hydrALAZINE  12.5 mg Oral 3 times per day  . insulin aspart  0-20 Units Subcutaneous TID WC  . insulin aspart  0-5 Units Subcutaneous QHS  . insulin glargine  30 Units Subcutaneous QHS  . isosorbide mononitrate  30 mg Oral Daily  . potassium chloride  40 mEq Oral BID  . potassium chloride  40 mEq Oral BID  . sodium chloride  10-40 mL Intracatheter Q12H  . spironolactone  12.5 mg Oral Daily   Continuous Infusions: . amiodarone 30 mg/hr (01/04/15 0629)  . furosemide (LASIX) infusion    . milrinone 0.25 mcg/kg/min (01/04/15 0000)   PRN Meds:.acetaminophen, ALPRAZolam, ondansetron (ZOFRAN) IV, oxyCODONE-acetaminophen, sodium chloride    Filed Vitals:   01/04/15 0400 01/04/15 0410 01/04/15 0500 01/04/15 0600  BP: 106/55  106/60 113/59  Pulse: 120  116 116  Temp: 97.6 F (36.4 C)     TempSrc: Axillary     Resp: 24  18 19   Height:      Weight:  293 lb 10.4 oz (133.2 kg)    SpO2: 98%  97% 91%    Intake/Output Summary (Last 24 hours) at 01/04/15 0718 Last data filed at 01/04/15 [id number]  Gross per 24 hour  Intake 2055.81 ml  Output   2225 ml  Net -169.19 ml    LABS: Basic Metabolic Panel:  Recent Labs  11/91/4703/23/16 0400 01/04/15 0440  NA 134* 134*  K 3.4* 2.9*  CL 101 102  CO2 21 23  GLUCOSE 128* 113*  BUN 50* 33*  CREATININE 2.02* 1.74*  CALCIUM 7.8* 7.3*   Liver Function Tests:  Recent Labs  01/03/15 0400 01/04/15 0440  AST 250* 249*  ALT 253* 282*  ALKPHOS 227* 194*  BILITOT 6.2* 2.9*  PROT 4.7* 4.7*  ALBUMIN 2.9* 2.6*    Recent Labs  01/03/15 0830  LIPASE 28   CBC:  Recent Labs  01/03/15 0350 01/04/15 0440  WBC 10.6* 9.4  HGB 12.7* 11.8*  HCT 38.0* 35.8*  MCV 81.2 82.3  PLT 234 141*   Cardiac Enzymes: No results for input(s): CKTOTAL, CKMB, CKMBINDEX, TROPONINI in the last 72 hours. BNP: Invalid input(s): POCBNP D-Dimer: No results for input(s): DDIMER in the last 72 hours. Hemoglobin A1C: No results for input(s): HGBA1C in the last 72 hours. Fasting Lipid Panel: No results for input(s): CHOL, HDL, LDLCALC, TRIG, CHOLHDL, LDLDIRECT in the last 72 hours. Thyroid Function Tests: No results for input(s): TSH, T4TOTAL, T3FREE, THYROIDAB in the last 72 hours.  Invalid input(s): FREET3 Anemia Panel: No results for input(s): VITAMINB12, FOLATE, FERRITIN, TIBC, IRON, RETICCTPCT in the last 72 hours.  RADIOLOGY: Ct Abdomen Pelvis Wo Contrast  01/03/2015   CLINICAL DATA:  Abdominal pain, elevated LFTs and liver failure. Nausea.  EXAM: CT ABDOMEN AND PELVIS WITHOUT CONTRAST  TECHNIQUE: Multidetector CT imaging of the abdomen and pelvis was performed following the standard protocol without IV contrast.  COMPARISON:  Abdominal ultrasound 01/01/2015  FINDINGS: Small right pleural effusion. The  heart is enlarged. There is bibasilar atelectasis in the lower lobes.  Liver is prominent in size with decreased density, suspect steatosis. There is decreased density adjacent to the falciform ligament that may reflect more focal steatosis. The questioned lesion on ultrasound is not well seen, detailed evaluation limited given lack of intravenous contrast. The gallbladder is physiologically distended. Probable sludge in the gallbladder lumen. No pericholecystic fluid. Common bile duct is not well visualized.  Spleen is normal in size. Pancreas and adrenal glands are normal. There is no hydronephrosis or perinephric stranding. No renal stones. Suspect 1.3 cm cyst in the lower right kidney.  Stomach is decompressed. There are no dilated  or thickened bowel loops. The appendix is not definitively identified. Small volume of stool throughout the colon. There is oral contrast throughout the colon.  The abdominal aorta is normal in caliber, densely calcified. No retroperitoneal adenopathy. No free air, free fluid, or intra-abdominal fluid collection. No ascites. There is a small to moderate fat containing umbilical hernia.  Within the pelvis the bladder is minimally distended. Prostate gland is normal in size. No pelvic free fluid or ascites. No pelvic adenopathy.  There is degenerative change in the lower lumbar spine. No acute osseous abnormality or focal osseous lesion.  IMPRESSION: 1. Decreased hepatic density, suspect hepatic steatosis. There is more focal fatty infiltration adjacent to the falciform ligament of the liver. The lesion on ultrasound is not seen without IV contrast, characterization recommended with MRI. 2. Probable sludge in the gallbladder. 3. Small right pleural effusion, new from prior exam. 4. Fat containing umbilical hernia.   Electronically Signed   By: Rubye Oaks M.D.   On: 01/03/2015 05:40   Ct Angio Chest Pe W/cm &/or Wo Cm  12/31/2014   CLINICAL DATA:  Shortness of breath. Abdominal pain. Productive cough.  EXAM: CT ANGIOGRAPHY CHEST WITH CONTRAST  TECHNIQUE: Multidetector CT imaging of the chest was performed using the standard protocol during bolus administration of intravenous contrast. Multiplanar CT image reconstructions and MIPs were obtained to evaluate the vascular anatomy.  CONTRAST:  OMNIPAQUE IOHEXOL 350 MG/ML SOLN  COMPARISON:  Chest x-ray dated 12/31/2014  FINDINGS: There are no pulmonary emboli or infiltrates or effusions. There is a 5 mm peripheral nodule in the right lower lobe on image 58 of series 6. The patient has mediastinal and hilar adenopathy. There is a 13 mm node just to the left of the aortic arch on image 29, and azygos node measuring 13 mm on image 33, and a prominent nodal mass  measuring 6.6 x 2.7 x 4.8 cm in the subcarinal region. Part of the soft tissue density represents the esophagus but I think of the scar this is adenopathy.  There is cardiomegaly with dilatation of the left ventricle.  No osseous abnormality. The visualized portion of the upper abdomen is normal.  Review of the MIP images confirms the above findings.  IMPRESSION: 1. No pulmonary emboli. 2. Cardiomegaly with dilatation of the right ventricle. 3. Mediastinal and hilar adenopathy of unknown etiology. The possibility of malignancy should be considered. 4. 5 mm nodule in the periphery of the right lower lobe. This should be followed up in 6 months with chest CT without contrast if the adenopathy is not determined to be malignant.   Electronically Signed   By: Francene Boyers M.D.   On: 12/31/2014 17:33   Dg Chest Port 1 View  01/02/2015   CLINICAL DATA:  Left central line insertion  EXAM: PORTABLE CHEST -  1 VIEW  COMPARISON:  12/31/2014  FINDINGS: New left IJ catheter, tip at the SVC level. No interval displacement of the right sided pacer/ICD.  Increased hazy density of the lower chest is likely from soft tissue attenuation. There is ongoing pulmonary venous congestion. No pneumothorax.  IMPRESSION: 1. New left IJ catheter is in good position.  No pneumothorax. 2. Unchanged cardiomegaly and pulmonary venous congestion.   Electronically Signed   By: Marnee Spring M.D.   On: 01/02/2015 19:54   Dg Chest Port 1 View  12/31/2014   CLINICAL DATA:  Shortness of breath and chest pain  EXAM: PORTABLE CHEST - 1 VIEW  COMPARISON:  None.  FINDINGS: Cardiac shadow is enlarged. A defibrillator is noted. No focal infiltrate or sizable effusion is seen.  IMPRESSION: No active disease.   Electronically Signed   By: Alcide Clever M.D.   On: 12/31/2014 14:14   US Abdomen Limited Ruq  01/01/2015   CLINICAL DATA:  Abnormal LFTs  EXAM: US ABDOMEN LIMITED - RIGHT UPPER QUADRANT  COMPARISON:  None.  FINDINGS: Gallbladder:  There is  intraluminal sludge. Gallbladder wall thickness is normal, 2.0 mm. The patient was not tender over the gallbladder.  Common bile duct:  Diameter: 5.2 mm, normal  Liver:  The liver is enlarged, over 20 cm craniocaudal. It is diffusely increased in echogenicity consistent with fatty infiltration. There is a 2.5 cm focal hyperechoic homogeneous circumscribed lesion which likely represents a benign cavernous hemangioma.  IMPRESSION: 1. Enlarged liver with abnormally increased echogenicity. Fatty infiltration or other liver disease can produce this appearance. 2. 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 MRI with contrast would conclusively characterize this abnormality.   Electronically Signed   By: Ellery Plunk M.D.   On: 01/01/2015 06:05    PHYSICAL EXAM General: NAD Neck: JVP 14-16 cm, no thyromegaly or thyroid nodule.  Lungs: Slight crackles at bases.  CV: Lateral PMI.  Heart tachy, irregular S1/S2, no def S3 but tachy, no murmur.  1+ ankle edema.   Abdomen: Soft, slight RUQ tenderness, no hepatosplenomegaly, no distention.  Neurologic: Alert and oriented x 3.  Psych: Normal affect. Extremities: No clubbing or cyanosis.   TELEMETRY: Reviewed telemetry pt in typical atrial flutter with mild RVR  ASSESSMENT AND PLAN: 59 yo with history of CAD s/p multivessel PCI, ischemic cardiomyopathy with systolic CHF, CKD, and atrial flutter presented with atrial flutter/RVR and acute on chronic systolic CHF.   1. Acute on chronic systolic CHF: Ischemic cardiomyopathy, EF 15% by echo this admission.  He developed gradually worsening edema and dyspnea so came to ER.  Since admission, was treated with Lasix without great UOP and creatinine rose.  He was started on milrinone due to concern for low output and sent to CCU, line was placed.  He is more alert on milrinone, creatinine coming down.  CVP 12, co-ox 68%.  Remains  volume overloaded did not have a great diuresis yesterday. - Continue milrinone 0.25, will add digoxin and aim to wean him to this when he is out of atrial flutter and diuresed.  - Got Lasix 40 mg IV this morning, will transition to Lasix gtt 10 mg/hr.  - Continue hydralazine 12.5 mg tid + Imdur 30 daily.  - Add spironolactone 12.5 mg daily and replete K.  - Will need formal RHC after diuresis prior to discharge.  As he will be anticoagulated, would try to do this from  right brachial IV.  - He was evaluated for advanced therapies 2 years ago at [facility] and deemed to be a poor candidate (apparently compliance issues and lives alone).  He has children but they do not live with him, not sure he would have much help at home.  We will need to consider his candidacy for LVAD/transplant going forward.   2. Atrial flutter: Patient has history of atrial flutter, was cardioverted at [facility] in 2014.  He appears to have typical flutter with mild RVR.   - Amiodarone gtt - Continue Eliquis 5 mg bid. - EP plans ablation tomorrow after 5 dose Eliquis.  Will need TEE prior.   3. AKI on CKD: Creatinine up from baseline, cardiorenal component likely but also got contrast with CTA chest initially so may be component of contrast-mediated nephropathy. Creatinine better on milrinone.  4. Elevated LFTs: Suspect congestive hepatopathy.  Will follow LFTs.  Pending HCV.  5. CAD: h/o PCI.  Slight troponin elevation, suspect demand ischemia from CHF. Will wait for LFTs to come down some then restart statin.   35 minutes critical care time  Marca Ancona 01/04/2015 7:18 AM \

## 2015-01-04 NOTE — Progress Notes (Signed)
RN reports pt just back to bed after being in the recliner all day and then going to BR. Sts he was exhausted with HR hitting 150s. Will allow pt to rest this pm and f/u after ablation. Ethelda ChickKristan Keera Altidor CES, ACSM 2:08 PM 01/04/2015

## 2015-01-05 ENCOUNTER — Encounter (HOSPITAL_COMMUNITY)
Admission: EM | Disposition: A | Discharge status: Home Health | Payer: Self-pay | Source: Home / Self Care | Attending: Cardiology

## 2015-01-05 ENCOUNTER — Inpatient Hospital Stay (HOSPITAL_COMMUNITY): Payer: Medicare Other | Admitting: Certified Registered Nurse Anesthetist

## 2015-01-05 ENCOUNTER — Inpatient Hospital Stay (HOSPITAL_COMMUNITY): Payer: Medicare Other

## 2015-01-05 ENCOUNTER — Encounter (HOSPITAL_COMMUNITY): Payer: Self-pay

## 2015-01-05 DIAGNOSIS — I4892 Unspecified atrial flutter: Secondary | ICD-10-CM

## 2015-01-05 DIAGNOSIS — I340 Nonrheumatic mitral (valve) insufficiency: Secondary | ICD-10-CM

## 2015-01-05 DIAGNOSIS — J95821 Acute postprocedural respiratory failure: Secondary | ICD-10-CM

## 2015-01-05 HISTORY — PX: ATRIAL FLUTTER ABLATION: SHX5733

## 2015-01-05 HISTORY — PX: TEE WITHOUT CARDIOVERSION: SHX5443

## 2015-01-05 LAB — COMPREHENSIVE METABOLIC PANEL
ALT: 216 U/L — AB (ref 0–53)
AST: 111 U/L — ABNORMAL HIGH (ref 0–37)
Albumin: 2.7 g/dL — ABNORMAL LOW (ref 3.5–5.2)
Alkaline Phosphatase: 184 U/L — ABNORMAL HIGH (ref 39–117)
Anion gap: 10 (ref 5–15)
BUN: 25 mg/dL — ABNORMAL HIGH (ref 6–23)
CO2: 23 mmol/L (ref 19–32)
Calcium: 7.6 mg/dL — ABNORMAL LOW (ref 8.4–10.5)
Chloride: 101 mmol/L (ref 96–112)
Creatinine, Ser: 1.92 mg/dL — ABNORMAL HIGH (ref 0.50–1.35)
GFR calc non Af Amer: 37 mL/min — ABNORMAL LOW (ref >90)
GFR, EST AFRICAN AMERICAN: 42 mL/min — AB (ref >90)
Glucose, Bld: 284 mg/dL — ABNORMAL HIGH (ref 70–99)
POTASSIUM: 4.4 mmol/L (ref 3.5–5.1)
SODIUM: 134 mmol/L — AB (ref 135–145)
TOTAL PROTEIN: 5.3 g/dL — AB (ref 6.0–8.3)
Total Bilirubin: 2.8 mg/dL — ABNORMAL HIGH (ref 0.3–1.2)

## 2015-01-05 LAB — BLOOD GAS, ARTERIAL
ACID-BASE DEFICIT: 4.6 mmol/L — AB (ref 0.0–2.0)
Bicarbonate: 20 mEq/L (ref 20.0–24.0)
O2 Content: 3 L/min
O2 SAT: 67.7 %
Patient temperature: 98.9
TCO2: 21.1 mmol/L (ref 0–100)
pCO2 arterial: 37.4 mmHg (ref 35.0–45.0)
pH, Arterial: 7.348 — ABNORMAL LOW (ref 7.350–7.450)
pO2, Arterial: 41 mmHg — ABNORMAL LOW (ref 80.0–100.0)

## 2015-01-05 LAB — CARBOXYHEMOGLOBIN
CARBOXYHEMOGLOBIN: 1.5 % (ref 0.5–1.5)
Carboxyhemoglobin: 1.7 % — ABNORMAL HIGH (ref 0.5–1.5)
Methemoglobin: 0.8 % (ref 0.0–1.5)
Methemoglobin: 0.9 % (ref 0.0–1.5)
O2 Saturation: 44.6 %
O2 Saturation: 66.7 %
TOTAL HEMOGLOBIN: 12.1 g/dL — AB (ref 13.5–18.0)
Total hemoglobin: 11.3 g/dL — ABNORMAL LOW (ref 13.5–18.0)

## 2015-01-05 LAB — GLUCOSE, CAPILLARY
GLUCOSE-CAPILLARY: 198 mg/dL — AB (ref 70–99)
GLUCOSE-CAPILLARY: 291 mg/dL — AB (ref 70–99)
GLUCOSE-CAPILLARY: 303 mg/dL — AB (ref 70–99)
Glucose-Capillary: 244 mg/dL — ABNORMAL HIGH (ref 70–99)
Glucose-Capillary: 242 mg/dL — ABNORMAL HIGH (ref 70–99)

## 2015-01-05 LAB — CBC
HCT: 38.3 % — ABNORMAL LOW (ref 39.0–52.0)
Hemoglobin: 12.2 g/dL — ABNORMAL LOW (ref 13.0–17.0)
MCH: 26.8 pg (ref 26.0–34.0)
MCHC: 31.9 g/dL (ref 30.0–36.0)
MCV: 84 fL (ref 78.0–100.0)
PLATELETS: 208 10*3/uL (ref 150–400)
RBC: 4.56 MIL/uL (ref 4.22–5.81)
RDW: 16.6 % — ABNORMAL HIGH (ref 11.5–15.5)
WBC: 10.8 10*3/uL — AB (ref 4.0–10.5)

## 2015-01-05 LAB — FERRITIN: Ferritin: 377 ng/mL — ABNORMAL HIGH (ref 22–322)

## 2015-01-05 LAB — HEPATITIS B SURFACE ANTIGEN: Hepatitis B Surface Ag: NEGATIVE

## 2015-01-05 SURGERY — ATRIAL FLUTTER ABLATION
Anesthesia: General

## 2015-01-05 SURGERY — ECHOCARDIOGRAM, TRANSESOPHAGEAL
Anesthesia: Moderate Sedation

## 2015-01-05 MED ORDER — FENTANYL CITRATE 0.05 MG/ML IJ SOLN: 50.0000 ug | Freq: Once | INTRAMUSCULAR | Status: DC

## 2015-01-05 MED ORDER — SODIUM CHLORIDE 0.9 % IV SOLN
25.0000 ug/h | INTRAVENOUS | Status: DC
  Administered 2015-01-05: 50 ug/h via INTRAVENOUS
  Filled 2015-01-05: qty 50

## 2015-01-05 MED ORDER — ONDANSETRON HCL 4 MG/2ML IJ SOLN
4.0000 mg | Freq: Once | INTRAMUSCULAR | Status: AC
  Administered 2015-01-05: 4 mg via INTRAVENOUS

## 2015-01-05 MED ORDER — MIDAZOLAM HCL 2 MG/2ML IJ SOLN
INTRAMUSCULAR | Status: AC
Start: 2015-01-05 — End: 2015-01-05
  Administered 2015-01-05: 2 mg
  Filled 2015-01-05: qty 4

## 2015-01-05 MED ORDER — SODIUM CHLORIDE 0.9 % IV SOLN
INTRAVENOUS | Status: DC | PRN
  Administered 2015-01-05: 11:00:00 via INTRAVENOUS

## 2015-01-05 MED ORDER — SUGAMMADEX SODIUM 200 MG/2ML IV SOLN
400.0000 mg | Freq: Once | INTRAVENOUS | Status: AC
  Administered 2015-01-05: 400 mg via INTRAVENOUS

## 2015-01-05 MED ORDER — GLYCOPYRROLATE 0.2 MG/ML IJ SOLN
INTRAMUSCULAR | Status: DC | PRN
  Administered 2015-01-05: 0.6 mg via INTRAVENOUS
  Administered 2015-01-05: 0.2 mg via INTRAVENOUS

## 2015-01-05 MED ORDER — MIDAZOLAM HCL 2 MG/2ML IJ SOLN: 2.0000 mg | Freq: Once | INTRAMUSCULAR | Status: DC

## 2015-01-05 MED ORDER — PHENYLEPHRINE HCL 10 MG/ML IJ SOLN
10.0000 mg | INTRAMUSCULAR | Status: DC | PRN
  Administered 2015-01-05: 20 ug/min via INTRAVENOUS

## 2015-01-05 MED ORDER — SUGAMMADEX SODIUM 200 MG/2ML IV SOLN
INTRAVENOUS | Status: AC
  Filled 2015-01-05: qty 2

## 2015-01-05 MED ORDER — MIDAZOLAM HCL 2 MG/2ML IJ SOLN
2.0000 mg | Freq: Once | INTRAMUSCULAR | Status: DC
  Administered 2015-01-05: 2 mg via INTRAVENOUS

## 2015-01-05 MED ORDER — OFF THE BEAT BOOK
Freq: Once | Status: DC
  Filled 2015-01-05: qty 1

## 2015-01-05 MED ORDER — SUCCINYLCHOLINE CHLORIDE 20 MG/ML IJ SOLN
INTRAMUSCULAR | Status: DC | PRN
  Administered 2015-01-05 (×2): 150 mg via INTRAVENOUS

## 2015-01-05 MED ORDER — FENTANYL CITRATE 0.05 MG/ML IJ SOLN
INTRAMUSCULAR | Status: DC | PRN
  Administered 2015-01-05: 25 ug via INTRAVENOUS

## 2015-01-05 MED ORDER — ONDANSETRON HCL 4 MG/2ML IJ SOLN
INTRAMUSCULAR | Status: AC
  Filled 2015-01-05: qty 2

## 2015-01-05 MED ORDER — NEOSTIGMINE METHYLSULFATE 10 MG/10ML IV SOLN
INTRAVENOUS | Status: DC | PRN
  Administered 2015-01-05: 4 mg via INTRAVENOUS
  Administered 2015-01-05: 1 mg via INTRAVENOUS

## 2015-01-05 MED ORDER — FUROSEMIDE 10 MG/ML IJ SOLN
10.0000 mg/h | INTRAVENOUS | Status: DC
  Administered 2015-01-05: 10 mg/h via INTRAVENOUS
  Filled 2015-01-05: qty 25

## 2015-01-05 MED ORDER — ACETAMINOPHEN 325 MG PO TABS: 650.0000 mg | ORAL_TABLET | ORAL | Status: DC | PRN

## 2015-01-05 MED ORDER — PROMETHAZINE HCL 25 MG/ML IJ SOLN
12.5000 mg | Freq: Four times a day (QID) | INTRAMUSCULAR | Status: DC | PRN
  Administered 2015-01-05: 12.5 mg via INTRAVENOUS
  Filled 2015-01-05: qty 1

## 2015-01-05 MED ORDER — HYDROMORPHONE HCL 1 MG/ML IJ SOLN: 0.2500 mg | INTRAMUSCULAR | Status: DC | PRN

## 2015-01-05 MED ORDER — MIDAZOLAM HCL 2 MG/2ML IJ SOLN: 2.0000 mg | INTRAMUSCULAR | Status: DC | PRN

## 2015-01-05 MED ORDER — SODIUM CHLORIDE 0.9 % IV SOLN
250.0000 mL | INTRAVENOUS | Status: DC | PRN
  Administered 2015-01-07: 500 mL via INTRAVENOUS

## 2015-01-05 MED ORDER — ROCURONIUM BROMIDE 100 MG/10ML IV SOLN
INTRAVENOUS | Status: DC | PRN
  Administered 2015-01-05: 10 mg via INTRAVENOUS
  Administered 2015-01-05: 20 mg via INTRAVENOUS
  Administered 2015-01-05: 10 mg via INTRAVENOUS

## 2015-01-05 MED ORDER — MIDAZOLAM HCL 10 MG/2ML IJ SOLN
INTRAMUSCULAR | Status: DC | PRN
  Administered 2015-01-05 (×2): 2 mg via INTRAVENOUS

## 2015-01-05 MED ORDER — INSULIN GLARGINE 100 UNIT/ML ~~LOC~~ SOLN
40.0000 [IU] | Freq: Every day | SUBCUTANEOUS | Status: DC
  Administered 2015-01-05 – 2015-01-12 (×8): 40 [IU] via SUBCUTANEOUS
  Filled 2015-01-05 (×8): qty 0.4

## 2015-01-05 MED ORDER — MIDAZOLAM HCL 2 MG/2ML IJ SOLN
INTRAMUSCULAR | Status: AC
  Administered 2015-01-05: 2 mg
  Filled 2015-01-05: qty 2

## 2015-01-05 MED ORDER — FUROSEMIDE 10 MG/ML IJ SOLN
80.0000 mg | Freq: Once | INTRAMUSCULAR | Status: AC
  Administered 2015-01-05: 80 mg via INTRAVENOUS

## 2015-01-05 MED ORDER — PANTOPRAZOLE SODIUM 40 MG IV SOLR
40.0000 mg | Freq: Every day | INTRAVENOUS | Status: DC
  Administered 2015-01-05 – 2015-01-07 (×3): 40 mg via INTRAVENOUS
  Filled 2015-01-05 (×4): qty 40

## 2015-01-05 MED ORDER — ONDANSETRON HCL 4 MG/2ML IJ SOLN: 4.0000 mg | Freq: Four times a day (QID) | INTRAMUSCULAR | Status: DC | PRN

## 2015-01-05 MED ORDER — MILRINONE IN DEXTROSE 20 MG/100ML IV SOLN
0.3750 ug/kg/min | INTRAVENOUS | Status: AC
  Administered 2015-01-05: 0.375 ug/kg/min via INTRAVENOUS

## 2015-01-05 MED ORDER — MIDAZOLAM HCL 5 MG/ML IJ SOLN
INTRAMUSCULAR | Status: AC
  Filled 2015-01-05: qty 2

## 2015-01-05 MED ORDER — ONDANSETRON HCL 4 MG/2ML IJ SOLN
INTRAMUSCULAR | Status: DC | PRN
  Administered 2015-01-05: 4 mg via INTRAVENOUS

## 2015-01-05 MED ORDER — FENTANYL BOLUS VIA INFUSION
50.0000 ug | INTRAVENOUS | Status: DC | PRN
  Filled 2015-01-05: qty 50

## 2015-01-05 MED ORDER — DIPHENHYDRAMINE HCL 50 MG/ML IJ SOLN
INTRAMUSCULAR | Status: AC
  Filled 2015-01-05: qty 1

## 2015-01-05 MED ORDER — AMIODARONE HCL IN DEXTROSE 360-4.14 MG/200ML-% IV SOLN
30.0000 mg/h | INTRAVENOUS | Status: DC
  Administered 2015-01-05: 30 mg/h via INTRAVENOUS
  Filled 2015-01-05: qty 200

## 2015-01-05 MED ORDER — SODIUM CHLORIDE 0.9 % IJ SOLN
3.0000 mL | Freq: Two times a day (BID) | INTRAMUSCULAR | Status: DC
  Administered 2015-01-07 – 2015-01-11 (×5): 3 mL via INTRAVENOUS

## 2015-01-05 MED ORDER — CALCIUM CHLORIDE 10 % IV SOLN
INTRAVENOUS | Status: AC
  Filled 2015-01-05: qty 10

## 2015-01-05 MED ORDER — PROPOFOL 10 MG/ML IV BOLUS
INTRAVENOUS | Status: DC | PRN
  Administered 2015-01-05: 100 mg via INTRAVENOUS
  Administered 2015-01-05: 120 mg via INTRAVENOUS

## 2015-01-05 MED ORDER — ONDANSETRON HCL 4 MG/2ML IJ SOLN: 4.0000 mg | Freq: Once | INTRAMUSCULAR | Status: AC | PRN

## 2015-01-05 MED ORDER — PHENYLEPHRINE HCL 10 MG/ML IJ SOLN
INTRAMUSCULAR | Status: DC | PRN
  Administered 2015-01-05 (×2): 80 ug via INTRAVENOUS
  Administered 2015-01-05: 40 ug via INTRAVENOUS
  Administered 2015-01-05: 120 ug via INTRAVENOUS
  Administered 2015-01-05: 80 ug via INTRAVENOUS

## 2015-01-05 MED ORDER — FENTANYL CITRATE 0.05 MG/ML IJ SOLN
INTRAMUSCULAR | Status: AC
  Filled 2015-01-05: qty 2

## 2015-01-05 MED ORDER — FENTANYL CITRATE 0.05 MG/ML IJ SOLN
INTRAMUSCULAR | Status: DC | PRN
  Administered 2015-01-05 (×2): 25 ug via INTRAVENOUS

## 2015-01-05 MED ORDER — SODIUM CHLORIDE 0.9 % IJ SOLN
3.0000 mL | INTRAMUSCULAR | Status: DC | PRN
  Administered 2015-01-09: 3 mL via INTRAVENOUS
  Filled 2015-01-05: qty 3

## 2015-01-05 MED ORDER — BUTAMBEN-TETRACAINE-BENZOCAINE 2-2-14 % EX AERO
INHALATION_SPRAY | CUTANEOUS | Status: DC | PRN
  Administered 2015-01-05: 2 via TOPICAL

## 2015-01-05 MED ORDER — BUPIVACAINE HCL (PF) 0.25 % IJ SOLN
INTRAMUSCULAR | Status: AC
  Filled 2015-01-05: qty 30

## 2015-01-05 MED ORDER — LIDOCAINE HCL (CARDIAC) 20 MG/ML IV SOLN
INTRAVENOUS | Status: DC | PRN
  Administered 2015-01-05: 70 mg via INTRAVENOUS

## 2015-01-05 MED ORDER — AMIODARONE HCL IN DEXTROSE 360-4.14 MG/200ML-% IV SOLN
60.0000 mg/h | INTRAVENOUS | Status: AC
  Administered 2015-01-05: 33.33 mL/h via INTRAVENOUS

## 2015-01-05 MED ORDER — FENTANYL CITRATE 0.05 MG/ML IJ SOLN: 100.0000 ug | Freq: Once | INTRAMUSCULAR | Status: DC

## 2015-01-05 NOTE — Progress Notes (Signed)
Dr. Alva at bedside. 

## 2015-01-05 NOTE — Progress Notes (Signed)
Blood sample sent over in tube station, sticker labeled ABG, sample ran as ABG. After results entered, it was found to be a COOX, not an ABG. RN notified of discrepancy, will redraw sample to be ran as COOX. RT will continue to monitor.

## 2015-01-05 NOTE — Progress Notes (Addendum)
Wasted Fentanyl 250 ml down sink with second witness,  Astronomeryan RN.

## 2015-01-05 NOTE — Anesthesia Procedure Notes (Signed)
Procedure Name: Intubation Date/Time: 01/05/2015 11:34 AM Performed by: Rise PatienceBELL, Farley Crooker T Pre-anesthesia Checklist: Patient identified, Emergency Drugs available, Suction available and Patient being monitored Patient Re-evaluated:Patient Re-evaluated prior to inductionOxygen Delivery Method: Circle system utilized Preoxygenation: Pre-oxygenation with 100% oxygen Intubation Type: IV induction Ventilation: Mask ventilation without difficulty and Two handed mask ventilation required Laryngoscope Size: Miller and 2 Grade View: Grade I Tube type: Oral Tube size: 7.5 mm Number of attempts: 1 Airway Equipment and Method: Stylet Placement Confirmation: ETT inserted through vocal cords under direct vision,  positive ETCO2 and breath sounds checked- equal and bilateral Secured at: 23 cm Tube secured with: Tape Dental Injury: Teeth and Oropharynx as per pre-operative assessment

## 2015-01-05 NOTE — Op Note (Signed)
SURGEON:  Hillis RangeJames Melah Ebling, MD      PREPROCEDURE DIAGNOSIS:  Atrial flutter, chronic systolic dysfunction     POSTPROCEDURE DIAGNOSIS:  Isthmus-dependent right atrial flutter, chronic systolic dysfunction     PROCEDURES:   1. Comprehensive EP study.   2. Coronary sinus pacing and recording.   3. Mapping of supraventricular tachycardia.   4. Radiofrequency ablation of supraventricular tachycardia.   5. ICD interrogation and reprogramming     INTRODUCTION: Daniel Blankenship is a 59 y.o. male with a history of typical appearing atrial flutter who presents today for EP study and radiofrequency ablation.  The patient presented with decompenated CHF in the setting of atrial flutter with RVR.   Rate control has been difficult.  He has required milninone therapy for his CHF.  He also has CRI.  He has been initiated on eliquis for stroke prevention.  TEE today reveals no thrombus. The patient now presents for EP study and radiofrequency ablation of atrial flutter.      DESCRIPTION OF PROCEDURE:  Informed written consent was obtained and the patient was brought to the Electrophysiology Lab in the fasting state.  His ICD was interrogated and tachycardia therapies were programmed off. The patient was adequately sedated with intravenous medication as outlined in the anesthesia report.  The patient's right groin was prepped and draped in the usual sterile fashion by the EP Lab staff.  Using a percutaneous Seldinger technique, one 6 and one 7-French and one 8-French hemostasis sheaths were placed into the right common femoral vein.  A 7-French decapolar Biosense Webster catheter was introduced through the right common femoral vein and advanced into the coronary sinus for recording and pacing from this location.  A 6-French quadripolar Josephson catheter was introduced through the right common femoral vein and advanced into the right ventricle for recording and pacing.  This catheter was then pulled back to the His  bundle location.    Presenting Measurements: The patient presented to the Electrophysiology Lab in atrial flutter.  The surface electrocardiogram was consistent with typical atrial flutter.  The atrial flutter cycle length was 278 milliseconds.  The coronary sinus catheter activation revealed proximal to distal activation and was therefore suggestive of right atrial flutter.  The patient's QRS duration was 96 milliseconds with a RR interval of 535 and an HV interval of 82 milliseconds.   Entrainment and Mapping: Entrainment was performed from the left atrium, which revealed a long postpacing interval.  A [employer] Blazer II XB 8-mm ablation catheter was introduced through the right common femoral vein and advanced into the right atrium.  The catheter was positioned along the cavotricuspid isthmus.  Entrainment mapping was performed from the cavotricuspid isthmus.  The postpacing interval was equal to the tachycardia cycle length when pacing in this location during entrainment.  The patient was therefore felt to have isthmus-dependent right atrial flutter.  Mapping was performed along the atrial side of the cavotricuspid isthmus.  This demonstrated a large-sized isthmus.  I therefore elected to perform cavotricuspid isthmus ablation today.   Ablation: The ablation catheter was therefore positioned along the cavotricuspid isthmus and a series of radiofrequency applications were delivered with a target temperature of 60 degrees of 70 watts for 120 seconds each.  The tachycardia slowed and then terminated during radiofrequency application.  Additional mapping of the atrial signal was performed with additional ablation performed. A 8.63F RAMP sheath was used to adequate reach along the isthmus.  A 7-French Biosense Webster dual decapolar halo catheter was  introduced through the right common femoral vein and advanced into the right atrium.  This catheter was positioned around the tricuspid valve annulus.   This demonstrated that complete bidirectional isthmus block was achieved as evident by differential atrial pacing from the low lateral right atrium.  The stimulus to earliest activation measured 200 msec across the isthmus bidirectionally.  The patient was observed for 20 minutes without return of conduction through the cavotricuspid isthmus.    Measurements following ablation: The AH interval measured 119 milliseconds with an HV interval of 85 milliseconds.  Atrial pacing was performed, which revealed decremental AV conduction with no evidence of PR greater than RR.  The AV Wenckebach cycle length was 440 milliseconds.  Atrial pacing was continued down to a cycle length of 200 milliseconds with no arrhythmias induced.  Ventricular pacing was performed, which revealed VA dissociation when pacing at a cycle length of 600 msec.  No arrhythmias were induced. The procedure was therefore considered completed.  All catheters were removed and the sheaths were aspirated and flushed.  The sheaths were removed and hemostasis was assured. EBL<25ml.  There were no early apparent complications.  The ICD was interrogated and RV lead sensing/ impedance/ threshold is unchanged when compared to prior to the procedure.  Tachycardia therapies were programmed back on.     CONCLUSIONS:   1. Isthmus-dependent right atrial flutter upon presentation.   2. Successful radiofrequency ablation of atrial flutter along the cavotricuspid isthmus with complete bidirectional isthmus block achieved.   3. No inducible arrhythmias following ablation.   4. No early apparent complications.         Hillis Range MD, [facility] 01/05/2015 1:27 PM

## 2015-01-05 NOTE — Progress Notes (Signed)
Pt. Has been nauseous and vomiting since 2100. Gave PRN zofran @2214  with little relief. MD notified and order placed.

## 2015-01-05 NOTE — Progress Notes (Signed)
Marland Kitchen   PROGRESS NOTE  Daniel Blankenship ZOX:[id number] DOB: Jan 12, 1956 DOA: 12/31/2014 PCP: No primary care provider on file.  HPI/Recap of past 11 hours: 59 year old male with past mental history of systolic heart failure with cardiomyopathy status post [name], diabetes mellitus with secondary renal disease and medical noncompliance admitted on 3/20 with complaints of leg swelling, abdominal pain and shortness of breath. He is also found to be markedly hyperglycemic as well as in acute heart failure. That time he was noted to have mildly elevated LFTs. Patient made it to the hospitalist service. Abdominal ultrasound done on admission noted some gallbladder sludge.  On hospital day 2, patient started to feel little bit better. Diuresed some. CBG still remain markedly elevated.  By hospital day 3 initially, patient had done well with diuresis of a few liters and sugars more controlled. That afternoon however, he started developing severe 10/10 abdominal pain. In review of his admission labs it was discovered that he had markedly elevated bilirubin at 5.4 which increased to 6.9. LFTs were only minimally elevated. Follow-up full liver function studies were ordered including hepatitis panel, coags, lipase and repeat labs as well as a stat CT scan of the abdomen and pelvis without contrast. Unfortunately, patient lost IV access and there were unable to get new peripheral IV. In addition echocardiogram was done to assess his ejection fraction and patient was found to have ejection fraction of 15%. Critical care consulted who placed central line. Cardiology consulted and patient moved to [facility] ICU and started on milrinone drip.  Assessment/Plan:  1. Acute on chronic Systolic CHF -poor output state, Ef 15% on echo -Appreciate Cards input -continue milrinone per CArds -urine output remains poor, lasix stopped by Cards today -TEE this am -felt to be a poor candidate for advanced therapies 2years ago at [facility]  2.  Atrial flutter: H/o atrial flutter, was cardioverted at [facility] in 2014 -continue amiodarone per Cards, eliquis -TEE today per Cards  3. Abdominal pain:  -unclear etiology, likely due to poor output state -resolved, tolerating diet, CT of abd unremarkable -abnormal LFTs likely due to passive hepatic congestion  4. Abnormal LFTs -suspect due to CHf and congestive hepatomegaly -CT without obstruction, LFTs trending down -Hep C ab negative -appreciate Gi input  5. DM type 2, uncontrolled,  -increase lantus, SSI  6. CKD (chronic kidney disease), stage III:  -creatinine slightly higher with diuresis, monitor  7. Obesity (BMI 30-39.9): Patient is criteria with BMI greater than 30  8. Essential hypertension: -stable  9. Elevated troponin: Stable 0.06, not ACS and more likely in the setting of renal failure and heart failure  Code Status: Full code Family Communication: no family at bedside, will attempt to contact daughter  Disposition Plan: Keep in ICU  Consultants:  None  Procedures:  Central line placed by critical care  Echocardiogram done 3/22: Ejection fraction 15%, elevated diastolic dysfunction  Antibiotics:  None  Subjective: -no new complaints   Objective: BP 113/63 mmHg  Pulse 0  Temp(Src) 97.6 F (36.4 C) (Oral)  Resp 20  Ht  (1.905 m)  Wt 132.7 kg (292 lb 8.8 oz)  BMI 36.57 kg/m2  SpO2 97%  Intake/Output Summary (Last 24 hours) at 01/05/15 1313 Last data filed at 01/05/15 0725  Gross per 24 hour  Intake 1848.15 ml  Output   1100 ml  Net 748.15 ml   Filed Weights   01/03/15 0357 01/04/15 0410 01/05/15 0405  Weight: 133.3 kg (293 lb 14 oz) 133.2 kg (  293 lb 10.4 oz) 132.7 kg (292 lb 8.8 oz)    Exam:   General:  alert and oriented 3, no acute distress  Cardiovascular: irregular rhythm, borderline tachycardia  Respiratory: Clear to auscultation bilaterally  Abdomen: Soft, nontender, nondistended, positive bowel  sounds  Musculoskeletal: Clubbing or cyanosis, 1+ pitting edema from the knees down    Data Reviewed: Basic Metabolic Panel:  Recent Labs Lab 01/03/15 0400 01/04/15 0440 01/04/15 1300 01/04/15 1646 01/05/15 0430  NA 134* 134* 135 133* 134*  K 3.4* 2.9* 3.8 3.8 4.4  CL 101 102 101 101 101  CO2 21 23 23 23 23   GLUCOSE 128* 113* 276* 222* 284*  BUN 50* 33* 29* 25* 25*  CREATININE 2.02* 1.74* 1.59* 1.72* 1.92*  CALCIUM 7.8* 7.3* 7.5* 7.5* 7.6*   Liver Function Tests:  Recent Labs Lab 12/31/14 1355 01/01/15 0506 01/03/15 0400 01/04/15 0440 01/05/15 0430  AST 95* 133* 250* 249* 111*  ALT 177* 183* 253* 282* 216*  ALKPHOS 152* 141* 227* 194* 184*  BILITOT 5.4* 6.9* 6.2* 2.9* 2.8*  PROT 6.1 5.8* 4.7* 4.7* 5.3*  ALBUMIN 3.4* 3.2* 2.9* 2.6* 2.7*    Recent Labs Lab 01/03/15 0830  LIPASE 28   No results for input(s): AMMONIA in the last 168 hours. CBC:  Recent Labs Lab 12/31/14 1355 12/31/14 1418 01/02/15 1530 01/03/15 0350 01/04/15 0440 01/05/15 0430  WBC 12.4*  --  15.0* 10.6* 9.4 10.8*  NEUTROABS 9.5*  --   --   --   --   --   HGB 13.9 15.3 15.1 12.7* 11.8* 12.2*  HCT 43.1 45.0 44.6 38.0* 35.8* 38.3*  MCV 83.4  --  84.0 81.2 82.3 84.0  PLT 281  --  264 234 141* 208   Cardiac Enzymes:    Recent Labs Lab 12/31/14 2100 01/01/15 0506  TROPONINI 0.06* 0.06*   BNP (last 3 results)  Recent Labs  12/31/14 1430  BNP 386.4*    ProBNP (last 3 results) No results for input(s): PROBNP in the last 8760 hours.  CBG:  Recent Labs Lab 01/04/15 0733 01/04/15 1229 01/04/15 1629 01/04/15 2143 01/05/15 1030  GLUCAP 118* 229* 206* 198* 244*    Recent Results (from the past 240 hour(s))  MRSA PCR Screening     Status: None   Collection Time: 01/02/15  7:14 PM  Result Value Ref Range Status   MRSA by PCR NEGATIVE NEGATIVE Final    Comment:        The GeneXpert MRSA Assay (FDA approved for NASAL specimens only), is one component of a comprehensive  MRSA colonization surveillance program. It is not intended to diagnose MRSA infection nor to guide or monitor treatment for MRSA infections.      Studies: No results found.  Scheduled Meds: . antiseptic oral rinse  7 mL Mouth Rinse BID  . apixaban  5 mg Oral BID  . digoxin  0.125 mg Oral Daily  . docusate sodium  200 mg Oral Daily  . insulin aspart  0-20 Units Subcutaneous TID WC  . insulin aspart  0-5 Units Subcutaneous QHS  . insulin glargine  40 Units Subcutaneous QHS  . off the beat book   Does not apply Once  . sodium chloride  10-40 mL Intracatheter Q12H  . spironolactone  12.5 mg Oral Daily    Continuous Infusions: . sodium chloride 20 mL/hr at 01/05/15 0000  . amiodarone 30 mg/hr (01/05/15 1111)  . milrinone 0.25 mcg/kg/min (01/05/15 1111)  Time spent:  [facility]  [facility] Pager [phone]. If 7PM-7AM, please contact night-coverage at [email or web address], password [facility] 01/05/2015, 1:13 PM  LOS: 5 days

## 2015-01-05 NOTE — Progress Notes (Signed)
Patient transported to endo for procedure will complete assessment when patient returns no complaints at this time patient stable on monitor.

## 2015-01-05 NOTE — Progress Notes (Signed)
Pt. transported to 2H-05 on ventilator from PACU uneventful transport, RT covering unit notified, RT to monitior.

## 2015-01-05 NOTE — CV Procedure (Addendum)
Procedure: TEE  Indication: Pre-atrial flutter ablation.   Sedation: Versed 4 mg IV, Fentanyl 50 mcg IV  Findings: Moderately dilated LV with severe systolic dysfunction, EF 15% with global hypokinesis.  No LV thrombus found.  The RV was moderately dilated with severe systolic dysfunction.  There was mild to moderate TR.  There was moderate mitral regurgitation with ERO 0.24 cm^2 (likely from annular dilatation).  Trileaflet aortic valve with no AS or AI.  The right and left atria were only mildly dilated.  There was no LAA thrombus.  Negative bubble study so no evidence for ASD or PFO.  Mild plaque in the descending thoracic aorta.   May proceed with ablation.   Daniel Blankenship 01/05/2015 8:39 AM

## 2015-01-05 NOTE — Progress Notes (Signed)
Give fentanyl 100 mcg per Dr Vassie LollAlva

## 2015-01-05 NOTE — Progress Notes (Signed)
Fentanyl 50mcg given

## 2015-01-05 NOTE — Consult Note (Signed)
PULMONARY / CRITICAL CARE MEDICINE   Name: Daniel Blankenship MRN: [id number] DOB: [birth year]    ADMISSION DATE:  12/31/2014 CONSULTATION DATE:  01/05/2015  REFERRING MD :  Allred  CHIEF COMPLAINT:  resp failure requiring reintubation in PACU  INITIAL PRESENTATION: 59 y.o with EF 15% on milrinone gtt, CKD ,underwent ablation for atrial flutter. Failed extubation & reintubated in cath lab. We are asked to assume care  STUDIES:  3/20 abd [place] >> GB sludge 3/22 echo -EF 15%, diffuse hypo 3/23 Ct abdoen >> fatty liver  SIGNIFICANT EVENTS: 3/25 intra-op TEE 3/25 flutter ablation   HISTORY OF PRESENT ILLNESS:  59 year old male with past mental history of systolic heart failure with cardiomyopathy status post [name], diabetes mellitus, CKD, adm 3/20 with pedal edema, acute systolic CHF & elevated LFTs H/o atrial flutter, was cardioverted at [facility] in 2014. He was put on milrinone gtt & amio gtt, but rate control was difficult, TEE neg. Failed extubation post ablation, Given sugammadex to reverse rocronium, reintubated & taken to PACU .  PAST MEDICAL HISTORY :   has a past medical history of Diabetes mellitus without complication; Atrial flutter with rapid ventricular response; Acute renal failure; Vascular dementia; CHF (congestive heart failure); Delirium; Ischemic cardiomyopathy; Septic shock; Pneumonia; CAD (coronary artery disease); Anemia; Hypertension; Hyperlipidemia; and Anxiety.  has past surgical history that includes Cardioversion; Joint replacement; Coronary angioplasty with stent; and Cardiac defibrillator placement. Prior to Admission medications   Medication Sig Start Date End Date Taking? Authorizing Provider  ALPRAZolam (NIRAVAM) 0.5 MG dissolvable tablet Take one tablet by mouth three times daily after meals. Hold for sedation. 04/05/13  Yes Sharon Seller, NP  aspirin 81 MG tablet Take 81 mg by mouth daily.   Yes Historical Provider, MD  atorvastatin (LIPITOR) 80 MG tablet  Take 40 mg by mouth daily.    Yes Historical Provider, MD  docusate sodium (COLACE) 100 MG capsule Take 200 mg by mouth daily.   Yes Historical Provider, MD  metoprolol tartrate (LOPRESSOR) 25 MG tablet Take 25 mg by mouth every 6 (six) hours.   Yes Historical Provider, MD  nitroGLYCERIN (NITRODUR - DOSED IN MG/24 HR) 0.4 mg/hr Place 1 patch onto the skin daily.   Yes Historical Provider, MD  oxyCODONE-acetaminophen (ROXICET) 5-325 MG per tablet Take 1 tablet by mouth every 6 (six) hours as needed for pain. 04/14/13  Yes Tiffany L Reed, DO   Allergies  Allergen Reactions  . Lisinopril   . Sulfa Antibiotics   . Zocor [Simvastatin]     FAMILY HISTORY:  has no family status information on file.  SOCIAL HISTORY:  reports that he has never smoked. He does not have any smokeless tobacco history on file. He reports that he does not drink alcohol.  REVIEW OF SYSTEMS:  Unable since intubated  SUBJECTIVE:   VITAL SIGNS: Temp:  [97.6 F (36.4 C)-98.4 F (36.9 C)] 98.4 F (36.9 C) (03/25 1400) Pulse Rate:  [0-123] 94 (03/25 1600) Resp:  [7-42] 16 (03/25 1600) BP: (54-121)/(15-104) 106/60 mmHg (03/25 1558) SpO2:  [84 %-100 %] 100 % (03/25 1600) FiO2 (%):  [40 %-60 %] 60 % (03/25 1456) Weight:  [132.7 kg (292 lb 8.8 oz)] 132.7 kg (292 lb 8.8 oz) (03/25 0405) HEMODYNAMICS: CVP:  [12 mmHg-13 mmHg] 12 mmHg VENTILATOR SETTINGS: Vent Mode:  [-] PRVC FiO2 (%):  [40 %-60 %] 60 % Set Rate:  [16 bmp] 16 bmp Vt Set:  [650 mL] 650 mL PEEP:  [5 cmH20-8 cmH20] 8  cmH20 Plateau Pressure:  [22 cmH20] 22 cmH20 INTAKE / OUTPUT:  Intake/Output Summary (Last 24 hours) at 01/05/15 1609 Last data filed at 01/05/15 1600  Gross per 24 hour  Intake 1658.25 ml  Output   1100 ml  Net 558.25 ml    PHYSICAL EXAMINATION: Gen. Pleasant, obese, in no distress, normal affect ENT - no lesions, no post nasal drip, class 2-3 airway, 7.5 ETT Neck: No JVD, no thyromegaly, no carotid bruits Lungs: no use of  accessory muscles, no dullness to percussion, decreased without rales or rhonchi  Cardiovascular: Rhythm regular, heart sounds  normal, no murmurs or gallops, no peripheral edema Abdomen: soft and non-tender, no hepatosplenomegaly, BS normal. Musculoskeletal: No deformities, no cyanosis or clubbing, 1+ edema Neuro:  alert, non focal, no tremors   LABS:  CBC  Recent Labs Lab 01/03/15 0350 01/04/15 0440 01/05/15 0430  WBC 10.6* 9.4 10.8*  HGB 12.7* 11.8* 12.2*  HCT 38.0* 35.8* 38.3*  PLT 234 141* 208   Coag's  Recent Labs Lab 01/02/15 1530  INR 1.63*   BMET  Recent Labs Lab 01/04/15 1300 01/04/15 1646 01/05/15 0430  NA 135 133* 134*  K 3.8 3.8 4.4  CL 101 101 101  CO2 23 23 23   BUN 29* 25* 25*  CREATININE 1.59* 1.72* 1.92*  GLUCOSE 276* 222* 284*   Electrolytes  Recent Labs Lab 01/04/15 1300 01/04/15 1646 01/05/15 0430  CALCIUM 7.5* 7.5* 7.6*   Sepsis Markers No results for input(s): LATICACIDVEN, PROCALCITON, O2SATVEN in the last 168 hours. ABG No results for input(s): PHART, PCO2ART, PO2ART in the last 168 hours. Liver Enzymes  Recent Labs Lab 01/03/15 0400 01/04/15 0440 01/05/15 0430  AST 250* 249* 111*  ALT 253* 282* 216*  ALKPHOS 227* 194* 184*  BILITOT 6.2* 2.9* 2.8*  ALBUMIN 2.9* 2.6* 2.7*   Cardiac Enzymes  Recent Labs Lab 12/31/14 2100 01/01/15 0506  TROPONINI 0.06* 0.06*   Glucose  Recent Labs Lab 01/04/15 0733 01/04/15 1229 01/04/15 1629 01/04/15 2143 01/05/15 1030 01/05/15 1400  GLUCAP 118* 229* 206* 198* 244* 291*    Imaging No results found.   ASSESSMENT / PLAN:  PULMONARY OETT3/25 >> A:Acute resp ailure post op P:   Vent bundleABG/CXR pendingSBTs in am  CARDIOVASCULAR CVL3/22 >> A: Cardiomyopathy Acute systolic CHF Atrial flutte s/p ablation P:  Milrinone gtt Amio gtt per cards  RENAL A:  AKI on CKD P:   Follow electrolytes CVP  GASTROINTESTINAL A:  Elevated LFTs due to ? Cardiac  cirrhosis P:   Npo follow  HEMATOLOGIC A:  No issues P:    INFECTIOUS A:  No issues P:     ENDOCRINE A:  DM-2   P:   SSI  NEUROLOGIC A:  Agitation P:   RASS goal: -1 fent gtt Versed prn   FAMILY  - Updates: no family at bedside  - Inter-disciplinary family meet or Palliative Care meeting due by:  4/1    TODAY'S SUMMARY: Vented post ablation, hope to extubate in 24 h    The patient is critically ill with multiple organ systems failure and requires high complexity decision making for assessment and support, frequent evaluation and titration of therapies, application of advanced monitoring technologies and extensive interpretation of multiple databases. Critical Care Time devoted to patient care services described in this note independent of APP time is 45 minutes.   Cyril Mourningakesh Jaretssi Kraker MD. Tonny BollmanFCCP. [facility] Pager [phone] If no response call [phone]   01/05/2015,  4:09 PM

## 2015-01-05 NOTE — H&P (View-Only) (Signed)
ELECTROPHYSIOLOGY CONSULT NOTE    Primary Care Physician: No primary care provider on file. Referring Physician:  Dr Shirlee LatchMcLean  Admit Date: 12/31/2014  Reason for consultation:  Atrial flutter  Daniel Blankenship is a 59 y.o. male with a h/o CAD, DM, HTN, and reduced EF (presumed to be due to an ischemic CM).  He is admitted with acute on chronic decompensated CHF.  He is noted to have atrial flutter with RVR.   [facility]. [facility] has also been [facility]. He has a known severe cardiomyopathy with LVEF less than 15%.  The patient apparently has a long history of medical noncompliance. He also has had uncontrolled diabetes. During a hospitalization in 2014, he was noted to have atrial flutter and the patient was cardioverted. He has undergone multivessel PCI in the past.  He has a SJM single chamber ICD implanted at [facility]. The patient reports worsening shortness of breath and weakness for several days. He's also had some chest discomfort. He describes swelling in his abdomen and legs. He reports no change in his diet. He denies any recent illness and specifically denies fevers, chills, or appetite change.  He is placed on milrinone.  He is also noted to have atrial flutter with RVR.   Past Medical History  Diagnosis Date  . Diabetes mellitus without complication   . Atrial flutter with rapid ventricular response   . Acute renal failure   . Vascular dementia   . CHF (congestive heart failure)   . Delirium   . Ischemic cardiomyopathy   . Septic shock   . Pneumonia   . CAD (coronary artery disease)   . Anemia   . Hypertension   . Hyperlipidemia   . Anxiety    Past Surgical History  Procedure Laterality Date  . Cardioversion    . Joint replacement    . Coronary angioplasty with stent placement    . Cardiac defibrillator placement      . antiseptic oral rinse  7 mL Mouth Rinse BID  . apixaban  5 mg Oral  BID  . aspirin  81 mg Oral Daily  . docusate sodium  200 mg Oral Daily  . furosemide  40 mg Intravenous 3 times per day  . hydrALAZINE  12.5 mg Oral 3 times per day  . insulin aspart  0-20 Units Subcutaneous TID WC  . insulin aspart  0-5 Units Subcutaneous QHS  . insulin glargine  30 Units Subcutaneous QHS  . isosorbide mononitrate  30 mg Oral Daily  . sodium chloride  10-40 mL Intracatheter Q12H   . amiodarone 30 mg/hr (01/03/15 0947)  . milrinone 0.25 mcg/kg/min (01/03/15 1417)    Allergies  Allergen Reactions  . Lisinopril   . Sulfa Antibiotics   . Zocor [Simvastatin]     History   Social History  . Marital Status: Single    Spouse Name: N/A  . Number of Children: N/A  . Years of Education: N/A   Occupational History  . Not on file.   Social History Main Topics  . Smoking status: Never Smoker   . Smokeless tobacco: Not on file  . Alcohol Use: No  . Drug Use: Not on file  . Sexual Activity: Not on file   Other Topics Concern  . Not on file   Social History Narrative    Family History  Problem Relation Age of Onset  . Family history unknown: Yes  ROS- All systems are reviewed and negative except as per the HPI above  Physical Exam: Telemetry: Filed Vitals:   01/03/15 1000 01/03/15 1100 01/03/15 1200 01/03/15 1300  BP: 95/52 95/56 100/58 91/73  Pulse: 120 115 102 103  Temp:  97.9 F (36.6 C)    TempSrc:  Oral    Resp: Height:      Weight:      SpO2: 98% 96% 99% 100%    GEN- The patient is overweight and ill appearing, alert and oriented x 3 today.   Head- normocephalic, atraumatic Eyes-  Sclera clear, conjunctiva pink Ears- hearing intact Oropharynx- clear Neck- supple, CVL in place Lungs- deceased BS at the bases Heart- tachycardic irregular rhythm GI- soft, NT, ND, + BS Extremities- no clubbing, cyanosis, + dependant edema MS- no significant deformity or atrophy Skin- no rash or lesion Psych- euthymic mood, full  affect Neuro- strength and sensation are intact  EKG typical appearing atrial flutter  Labs:   Lab Results  Component Value Date   WBC 10.6* 01/03/2015   HGB 12.7* 01/03/2015   HCT 38.0* 01/03/2015   MCV 81.2 01/03/2015   PLT 234 01/03/2015    Recent Labs Lab 01/03/15 0400  NA 134*  K 3.4*  CL 101  CO2 21  BUN 50*  CREATININE 2.02*  CALCIUM 7.8*  PROT 4.7*  BILITOT 6.2*  ALKPHOS 227*  ALT 253*  AST 250*  GLUCOSE 128*   Lab Results  Component Value Date   TROPONINI 0.06* 01/01/2015   No results found for: CHOL No results found for: HDL No results found for: LDLCALC No results found for: TRIG No results found for: CHOLHDL No results found for: LDLDIRECT    ASSESSMENT AND PLAN:   1. Persistent typical appearing atrial flutter The patient presents with symptoms of CHF.  This is likely exacerbated by atrial flutter with RVR.  I agree with Dr Shirlee Latch that the most prudent action would be ablation to minimize risk of recurrence, particularly given his prior noncompliance. Start eliquis Therapeutic strategies for atrial flutter including medicine and ablation were discussed in detail with the patient today. Risk, benefits, and alternatives to EP study and radiofrequency ablation were also discussed in detail today. These risks include but are not limited to stroke, bleeding, vascular damage, tamponade, perforation, damage to the heart and other structures, AV block requiring pacemaker, worsening renal function, and death. The patient understands these risk and wishes to proceed.  We will therefore proceed with catheter ablation at the next available time.  Proceed with tee guided ablation on Friday.  2. Acute on chronic systolic and diastolic dysfunction Will need further medicine optimization prior to ablation on Friday Will interrogate ICD prior to the ablation We may find that his EF improves with sinus rhythm.  I will be optimistic.  3. CAD No ischemic  symptoms   Hillis Range, MD 01/03/2015  4:18 PM

## 2015-01-05 NOTE — Progress Notes (Signed)
Called to PACU/CATH/EP lab for vent set up, pt. re-intubated due to delay in waking up from procedure, placed on Servo-i@650VT /16R/+5P/60%Fi02, has 7.5 E.T.@23  R side, to be transported to PACU -13 [facility],Phone number left on vent for staff,  RT to monitor.

## 2015-01-05 NOTE — Progress Notes (Signed)
577fr.8Fr, and 11Fr Sheaths aspirated and removed from rfv. Manual pressure applied for 30 minutes. No s+s of hematoma, groin level 0.  Right dp pulse present and easily palpable. Patient intubated and sedated.  Tegaderm dressing applied.  Bedrest begins at 14:50:00 for groin access site.

## 2015-01-05 NOTE — Progress Notes (Signed)
Notified Dr Sung AmabileSimonds of pt's self extubation, pt sating well on 3 L.  No other interventions at this time.  Eliane DecreeSmith, Kahleel Fadeley Moore, RN

## 2015-01-05 NOTE — Progress Notes (Signed)
Patient self extubated around 1715.  Was placed on nasal cannula.  Vitals currently stable.  Will continue to monitor.

## 2015-01-05 NOTE — Progress Notes (Addendum)
Patient ID: Daniel Blankenship, male   DOB: 07-22-1956, 59 y.o.   MRN: [id number]   SUBJECTIVE: UOP not vigorous again yesterday and weight stable despite increasing Lasix and getting metolazone.  HR high during the day (up to 150s in chair), had to increase amiodarone to 60 mg/hr.  This morning, CVP 12 and co-ox now lower at 47%.  He says he feels ok, no dyspnea.  SBP in 90s. Nausea overnight.  Scheduled Meds: . antiseptic oral rinse  7 mL Mouth Rinse BID  . apixaban  5 mg Oral BID  . digoxin  0.125 mg Oral Daily  . docusate sodium  200 mg Oral Daily  . insulin aspart  0-20 Units Subcutaneous TID WC  . insulin aspart  0-5 Units Subcutaneous QHS  . insulin glargine  30 Units Subcutaneous QHS  . sodium chloride  10-40 mL Intracatheter Q12H  . spironolactone  12.5 mg Oral Daily   Continuous Infusions: . sodium chloride 20 mL/hr at 01/05/15 0000  . amiodarone 60 mg/hr (01/05/15 0548)  . milrinone 0.25 mcg/kg/min (01/05/15 0415)   PRN Meds:.acetaminophen, ALPRAZolam, ondansetron (ZOFRAN) IV, oxyCODONE-acetaminophen, promethazine, sodium chloride    Filed Vitals:   01/05/15 0405 01/05/15 0500 01/05/15 0505 01/05/15 0600  BP: 91/60  Pulse: 110 100 108 104  Temp:      TempSrc:      Resp: Height:      Weight: 292 lb 8.8 oz (132.7 kg)     SpO2: 91% 94% 92% 95%    Intake/Output Summary (Last 24 hours) at 01/05/15 0728 Last data filed at 01/05/15 0600  Gross per 24 hour  Intake 2471.63 ml  Output   1400 ml  Net 1071.63 ml    LABS: Basic Metabolic Panel:  Recent Labs  40/98/11 1646 01/05/15 0430  NA 133* 134*  K 3.8 4.4  CL 101 101  CO2 23 23  GLUCOSE 222* 284*  BUN 25* 25*  CREATININE 1.72* 1.92*  CALCIUM 7.5* 7.6*   Liver Function Tests:  Recent Labs  01/04/15 0440 01/05/15 0430  AST 249* 111*  ALT 282* 216*  ALKPHOS 194* 184*  BILITOT 2.9* 2.8*  PROT 4.7* 5.3*  ALBUMIN 2.6* 2.7*    Recent Labs  01/03/15 0830  LIPASE 28     CBC:  Recent Labs  01/04/15 0440 01/05/15 0430  WBC 9.4 10.8*  HGB 11.8* 12.2*  HCT 35.8* 38.3*  MCV 82.3 84.0  PLT 141* 208   Cardiac Enzymes: No results for input(s): CKTOTAL, CKMB, CKMBINDEX, TROPONINI in the last 72 hours. BNP: Invalid input(s): POCBNP D-Dimer: No results for input(s): DDIMER in the last 72 hours. Hemoglobin A1C: No results for input(s): HGBA1C in the last 72 hours. Fasting Lipid Panel: No results for input(s): CHOL, HDL, LDLCALC, TRIG, CHOLHDL, LDLDIRECT in the last 72 hours. Thyroid Function Tests: No results for input(s): TSH, T4TOTAL, T3FREE, THYROIDAB in the last 72 hours.  Invalid input(s): FREET3 Anemia Panel: No results for input(s): VITAMINB12, FOLATE, FERRITIN, TIBC, IRON, RETICCTPCT in the last 72 hours.  RADIOLOGY: Ct Abdomen Pelvis Wo Contrast  01/03/2015   CLINICAL DATA:  Abdominal pain, elevated LFTs and liver failure. Nausea.  EXAM: CT ABDOMEN AND PELVIS WITHOUT CONTRAST  TECHNIQUE: Multidetector CT imaging of the abdomen and pelvis was performed following the standard protocol without IV contrast.  COMPARISON:  Abdominal ultrasound 01/01/2015  FINDINGS: Small right pleural effusion. The heart is enlarged. There is bibasilar atelectasis in the lower  lobes.  Liver is prominent in size with decreased density, suspect steatosis. There is decreased density adjacent to the falciform ligament that may reflect more focal steatosis. The questioned lesion on ultrasound is not well seen, detailed evaluation limited given lack of intravenous contrast. The gallbladder is physiologically distended. Probable sludge in the gallbladder lumen. No pericholecystic fluid. Common bile duct is not well visualized.  Spleen is normal in size. Pancreas and adrenal glands are normal. There is no hydronephrosis or perinephric stranding. No renal stones. Suspect 1.3 cm cyst in the lower right kidney.  Stomach is decompressed. There are no dilated or thickened bowel loops.  The appendix is not definitively identified. Small volume of stool throughout the colon. There is oral contrast throughout the colon.  The abdominal aorta is normal in caliber, densely calcified. No retroperitoneal adenopathy. No free air, free fluid, or intra-abdominal fluid collection. No ascites. There is a small to moderate fat containing umbilical hernia.  Within the pelvis the bladder is minimally distended. Prostate gland is normal in size. No pelvic free fluid or ascites. No pelvic adenopathy.  There is degenerative change in the lower lumbar spine. No acute osseous abnormality or focal osseous lesion.  IMPRESSION: 1. Decreased hepatic density, suspect hepatic steatosis. There is more focal fatty infiltration adjacent to the falciform ligament of the liver. The lesion on ultrasound is not seen without IV contrast, characterization recommended with MRI. 2. Probable sludge in the gallbladder. 3. Small right pleural effusion, new from prior exam. 4. Fat containing umbilical hernia.   Electronically Signed   By: Rubye OaksMelanie  Ehinger M.D.   On: 01/03/2015 05:40   Ct Angio Chest Pe W/cm &/or Wo Cm  12/31/2014   CLINICAL DATA:  Shortness of breath. Abdominal pain. Productive cough.  EXAM: CT ANGIOGRAPHY CHEST WITH CONTRAST  TECHNIQUE: Multidetector CT imaging of the chest was performed using the standard protocol during bolus administration of intravenous contrast. Multiplanar CT image reconstructions and MIPs were obtained to evaluate the vascular anatomy.  CONTRAST:  100mL OMNIPAQUE IOHEXOL 350 MG/ML SOLN  COMPARISON:  Chest x-ray dated 12/31/2014  FINDINGS: There are no pulmonary emboli or infiltrates or effusions. There is a 5 mm peripheral nodule in the right lower lobe on image 58 of series 6. The patient has mediastinal and hilar adenopathy. There is a 13 mm node just to the left of the aortic arch on image 29, and azygos node measuring 13 mm on image 33, and a prominent nodal mass measuring 6.6 x 2.7 x 4.8  cm in the subcarinal region. Part of the soft tissue density represents the esophagus but I think of the scar this is adenopathy.  There is cardiomegaly with dilatation of the left ventricle.  No osseous abnormality. The visualized portion of the upper abdomen is normal.  Review of the MIP images confirms the above findings.  IMPRESSION: 1. No pulmonary emboli. 2. Cardiomegaly with dilatation of the right ventricle. 3. Mediastinal and hilar adenopathy of unknown etiology. The possibility of malignancy should be considered. 4. 5 mm nodule in the periphery of the right lower lobe. This should be followed up in 6 months with chest CT without contrast if the adenopathy is not determined to be malignant.   Electronically Signed   By: Francene BoyersJames  Maxwell M.D.   On: 12/31/2014 17:33   Dg Chest Port 1 View  01/02/2015   CLINICAL DATA:  Left central line insertion  EXAM: PORTABLE CHEST - 1 VIEW  COMPARISON:  12/31/2014  FINDINGS: New left  IJ catheter, tip at the SVC level. No interval displacement of the right sided pacer/ICD.  Increased hazy density of the lower chest is likely from soft tissue attenuation. There is ongoing pulmonary venous congestion. No pneumothorax.  IMPRESSION: 1. New left IJ catheter is in good position.  No pneumothorax. 2. Unchanged cardiomegaly and pulmonary venous congestion.   Electronically Signed   By: Marnee Spring M.D.   On: 01/02/2015 19:54   Dg Chest Port 1 View  12/31/2014   CLINICAL DATA:  Shortness of breath and chest pain  EXAM: PORTABLE CHEST - 1 VIEW  COMPARISON:  None.  FINDINGS: Cardiac shadow is enlarged. A defibrillator is noted. No focal infiltrate or sizable effusion is seen.  IMPRESSION: No active disease.   Electronically Signed   By: Alcide Clever M.D.   On: 12/31/2014 14:14   US Abdomen Limited Ruq  01/01/2015   CLINICAL DATA:  Abnormal LFTs  EXAM: US ABDOMEN LIMITED - RIGHT UPPER QUADRANT  COMPARISON:  None.  FINDINGS: Gallbladder:  There is intraluminal sludge.  Gallbladder wall thickness is normal, 2.0 mm. The patient was not tender over the gallbladder.  Common bile duct:  Diameter: 5.2 mm, normal  Liver:  The liver is enlarged, over 20 cm craniocaudal. It is diffusely increased in echogenicity consistent with fatty infiltration. There is a 2.5 cm focal hyperechoic homogeneous circumscribed lesion which likely represents a benign cavernous hemangioma.  IMPRESSION: 1. Enlarged liver with abnormally increased echogenicity. Fatty infiltration or other liver disease can produce this appearance. 2. 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 MRI with contrast would conclusively characterize this abnormality.   Electronically Signed   By: Ellery Plunk M.D.   On: 01/01/2015 06:05    PHYSICAL EXAM General: NAD Neck: JVP 14 cm, no thyromegaly or thyroid nodule.  Lungs: Slight crackles at bases.  CV: Lateral PMI.  Heart tachy, irregular S1/S2, no def S3 but tachy, no murmur.  1+ ankle edema.   Abdomen: Soft, slight RUQ tenderness, no hepatosplenomegaly, no distention.  Neurologic: Alert and oriented x 3.  Psych: Normal affect. Extremities: No clubbing or cyanosis.   TELEMETRY: Reviewed telemetry pt in typical atrial flutter with mild RVR  ASSESSMENT AND PLAN: 59 yo with history of CAD s/p multivessel PCI, ischemic cardiomyopathy with systolic CHF, CKD, and atrial flutter presented with atrial flutter/RVR and acute on chronic systolic CHF.   1. Acute on chronic systolic CHF: Ischemic cardiomyopathy, EF 15% by echo this admission.  He developed gradually worsening edema and dyspnea so came to ER.  Since admission, was treated with Lasix without great UOP and creatinine rose.  He was started on milrinone due to concern for low output and sent to CCU, line was placed.  He became more alert on milrinone.  Initially he diuresed well but this has slowed and creatinine rising  again, 1.5 => 1.9.  Co-ox also lower this morning at 47%.  CVP 12 today, not markedly elevated but still has some fluid.  - Recheck co-ox today, unsure why it would fall so much except we did increase amiodarone.  Will not increase milrinone until after atrial flutter ablation given RVR.  Continue milrinone 0.25, added digoxin and aim to wean him to this when he is out of atrial flutter and diuresed.  - Hold Lasix until after flutter ablation later today given rise in creatinine.   - Will hold hydralazine/Imdur for now with soft BP.  -  Continue spironolactone.  - Will need formal RHC after diuresis prior to discharge.  As he will be anticoagulated, would try to do this from right brachial IV.  - He was evaluated for advanced therapies 2 years ago at [facility] and deemed to be a poor candidate (apparently compliance issues and lives alone).  He has children but they do not live with him, not sure he would have much help at home.  We will need to consider his candidacy for LVAD/transplant going forward.   2. Atrial flutter: Patient has history of atrial flutter, was cardioverted at [facility] in 2014.  He appears to have typical flutter with RVR, now controlled on amiodarone gtt.   - Nausea may be due to increased amiodarone (for RVR yesterday). Will decrease amiodarone back to 30 mg/hr.  - Continue Eliquis 5 mg bid. - TEE today, then atrial flutter ablation after that.  3. AKI on CKD: Creatinine up from baseline, cardiorenal component likely but also got contrast with CTA chest initially so may be component of contrast-mediated nephropathy. Creatinine initially better on milrinone, now worse.  Plan to hold Lasix until after flutter ablation and better rate control.  4. Elevated LFTs: Suspect congestive hepatopathy.  Will follow LFTs => improving. HCV negative.  5. CAD: h/o PCI.  Slight troponin elevation, suspect demand ischemia from CHF. Will wait for LFTs to come down some then restart statin.   35 minutes  critical care time  Marca Ancona 01/05/2015 7:28 AM  Patient had successful atrial flutter ablation.  He was lethargic post-extubation so was re-intubated.  He then woke up and self-extubated.  So far doing ok.  CVP 19-20.  He is now in NSR, BP higher.  - Start back on Lasix => 80 mg IV bolus then 10 mg/hr.  - Send co-ox - Suspect we can restart his hydralazine/Imdur tomorrow.   Marca Ancona 01/05/2015 5:24 PM

## 2015-01-05 NOTE — Progress Notes (Signed)
Doing well s/p atrial flutter today.  He has a known history of difficulty waking from anesthesia, which occurred today.  He did not wake up well after the procedure and therefore spent time in the PACU reintubated.  Now extubated and looks good.  Continue uninterrupted eliquis x 4 weeks Stop amiodarone  Hope to discontinue central lines soon.  Follow-up with me in 4 weeks  Electrophysiology team to see as needed while here. Please call with questions.

## 2015-01-05 NOTE — Progress Notes (Addendum)
Pt admitted from EP lab s/p ablation for a flutter.  Pt arrived intubated.  VT 650, 60%, 16, 5 .  Rt groin sheaths in place will be removed per cath lab.  Milrinone and amiodarone gtt infusing.   Will monitor.

## 2015-01-05 NOTE — Anesthesia Preprocedure Evaluation (Addendum)
Anesthesia Evaluation  Patient identified by MRN, date of birth, ID band Patient awake    Reviewed: Allergy & Precautions, NPO status , Patient's Chart, lab work & pertinent test results  Airway Mallampati: III  TM Distance: >3 FB Neck ROM: Full    Dental  (+) Dental Advisory Given, Teeth Intact   Pulmonary          Cardiovascular hypertension, + CAD, + Cardiac Stents, + Peripheral Vascular Disease and +CHF + dysrhythmias Atrial Fibrillation + Cardiac Defibrillator   01/05/15 Echo:  Findings: Moderately dilated LV with severe systolic dysfunction, EF 15% with global hypokinesis. No LV thrombus found. The RV was moderately dilated with severe systolic dysfunction. There was mild to moderate TR. There was moderate mitral regurgitation with ERO 0.24 cm^2 (likely from annular dilatation). Trileaflet aortic valve with no AS or AI. The right and left atria were only mildly dilated. There was no LAA thrombus. Negative bubble study so no evidence for ASD or PFO. Mild plaque in the descending thoracic aorta.    Neuro/Psych PSYCHIATRIC DISORDERS Anxiety    GI/Hepatic   Endo/Other  diabetes, Type 2, Oral Hypoglycemic AgentsMorbid obesity  Renal/GU Renal InsufficiencyRenal disease     Musculoskeletal   Abdominal   Peds  Hematology  (+) anemia ,   Anesthesia Other Findings   Reproductive/Obstetrics                         Anesthesia Physical Anesthesia Plan  ASA: IV  Anesthesia Plan: General   Post-op Pain Management:    Induction: Intravenous  Airway Management Planned: Oral ETT  Additional Equipment:   Intra-op Plan:   Post-operative Plan: Extubation in OR  Informed Consent: I have reviewed the patients History and Physical, chart, labs and discussed the procedure including the risks, benefits and alternatives for the proposed anesthesia with the patient or authorized representative who  has indicated his/her understanding and acceptance.   Dental advisory given  Plan Discussed with: CRNA, Anesthesiologist and Surgeon  Anesthesia Plan Comments:         Anesthesia Quick Evaluation

## 2015-01-05 NOTE — Progress Notes (Signed)
Received report from cath lab post procedure. Pt intubated and unable to wean will monitor patient in Cath lab PACU at this time.

## 2015-01-05 NOTE — Transfer of Care (Signed)
Immediate Anesthesia Transfer of Care Note  Patient: Daniel Blankenship  Procedure(s) Performed: Procedure(s): ATRIAL FLUTTER ABLATION (N/A)  Patient Location: PACU  Anesthesia Type:General  Level of Consciousness: lethargic, responds to stimulation and Patient remains intubated per anesthesia plan  Airway & Oxygen Therapy: Patient remains intubated per anesthesia plan and Patient placed on Ventilator (see vital sign flow sheet for setting)  Post-op Assessment: Report given to RN and Post -op Vital signs reviewed and stable  Post vital signs: Reviewed and stable  Last Vitals:  Filed Vitals:   01/05/15 1400  BP:   Pulse: 122  Temp: 36.9 C  Resp: 15    Complications: No apparent anesthesia complications

## 2015-01-05 NOTE — Interval H&P Note (Signed)
History and Physical Interval Note:  01/05/2015 8:00 AM   Pt with decomensated CHF likely exacerbated by atrial flutter.  I have spoken with Dr Shirlee LatchMcLean this am and we agree that though risks of ablation are high, the risk are outweighed by potential benefits.  TEE this am.  He has been on eliquis for several days.  If no LAA thrombus, will proceed with ablation.  Therapeutic strategies for atrial flutter  including medicine and ablation were discussed again in detail with the patient today. Risk, benefits, and alternatives to EP study and radiofrequency ablation were also discussed in detail today. These risks include but are not limited to stroke, bleeding, vascular damage, tamponade, perforation, damage to the heart and other structures, AV block requiring pacemaker, worsening renal function, ICD lead dislodgement, and death. The patient understands these risk and wishes to proceed.       Daniel Blankenship  has presented today for surgery, with the diagnosis of aflutter  The various methods of treatment have been discussed with the patient and family. After consideration of risks, benefits and other options for treatment, the patient has consented to  Procedure(s): ATRIAL FLUTTER ABLATION (N/A) as a surgical intervention .  The patient's history has been reviewed, patient examined, no change in status, stable for surgery.  I have reviewed the patient's chart and labs.  Questions were answered to the patient's satisfaction.     Daniel Blankenship

## 2015-01-05 NOTE — Progress Notes (Signed)
Echocardiogram Echocardiogram Transesophageal has been performed.  Daniel Blankenship 01/05/2015, 11:03 AM

## 2015-01-05 NOTE — Progress Notes (Addendum)
Called to clarify conflicting orders for amiodarone and milrinone.  Pt was on amiodarone earlier, Dr. Alford HighlandMcLean's note indicates 30mg /hr. After ablation there was a one time dose ordered for amio infusion "to-neuro-PACU" at 60mg /hr.   Milrinone is in for both 0.4425mcg earlier today then 0.375 mcg in afternoon "to PACU."   Per review of Dr. Alford HighlandMcLean's note it appears he wanted amiodarone at 30mg /hour due to nausea, and planned to increase milrinone after atrial flutter ablation which has taken place. D/w Dr. Myrtis SerKatz. Plan to continue amio at 30mg /hour and keep higher dose of milrinone as ordered.  Daniel Spiesayna Dunn PA-C   Attending Addendum: Amiodarone was for atrial flutter which has been successfully ablated.  No indication to continue amiodarone going forward. Hopefully can wean milrinone soon.  We need to get central line out ASAP given risks of bacteremia and implanted defibrillator.  Daniel RangeJames Jo Cerone MD

## 2015-01-05 NOTE — Progress Notes (Addendum)
Dr Katrinka BlazingSmith at bedside.  Order received to give 1 gm of Calcium chloride, followed by Bridion, and give versed 2 mg sedation

## 2015-01-05 NOTE — Progress Notes (Signed)
ANTICOAGULATION CONSULT NOTE - Follow up Consult  Pharmacy Consult for apixaban Indication: atrial fibrillation  Allergies  Allergen Reactions  . Lisinopril   . Sulfa Antibiotics   . Zocor [Simvastatin]     Patient Measurements: Height:  (190.5 cm) Weight: 292 lb 8.8 oz (132.7 kg) IBW/kg (Calculated) : 84.5 Heparin Dosing Weight: 113 kg  Vital Signs: Temp: 97.6 F (36.4 C) (03/25 0400) Temp Source: Oral (03/25 0400) BP: 113/63 mmHg (03/25 0928) Pulse Rate: 0 (03/25 0852)  Labs:  Recent Labs  01/02/15 1530 01/03/15 0350  01/03/15 0400 01/04/15 0440 01/04/15 1300 01/04/15 1646 01/05/15 0430  HGB 15.1 12.7*  --   --  11.8*  --   --  12.2*  HCT 44.6 38.0*  --   --  35.8*  --   --  38.3*  PLT 264 234  --   --  141*  --   --  208  LABPROT 19.5*  --   --   --   --   --   --   --   INR 1.63*  --   --   --   --   --   --   --   HEPARINUNFRC  --   --   --  0.84*  --   --   --   --   CREATININE  --   --   < > 2.02* 1.74* 1.59* 1.72* 1.92*  < > = values in this interval not displayed.  Estimated Creatinine Clearance: 60.8 mL/min (by C-G formula based on Cr of 1.92).   Medical History: Past Medical History  Diagnosis Date  . Diabetes mellitus without complication   . Atrial flutter with rapid ventricular response   . Acute renal failure   . Vascular dementia   . CHF (congestive heart failure)   . Delirium   . Ischemic cardiomyopathy   . Septic shock   . Pneumonia   . CAD (coronary artery disease)   . Anemia   . Hypertension   . Hyperlipidemia   . Anxiety     Medications:  Prescriptions prior to admission  Medication Sig Dispense Refill Last Dose  . ALPRAZolam (NIRAVAM) 0.5 MG dissolvable tablet Take one tablet by mouth three times daily after meals. Hold for sedation. 90 tablet 5 12/31/2014 at Unknown time  . aspirin 81 MG tablet Take 81 mg by mouth daily.   12/29/2014  . atorvastatin (LIPITOR) 80 MG tablet Take 40 mg by mouth daily.    12/29/2014  .  docusate sodium (COLACE) 100 MG capsule Take 200 mg by mouth daily.   Past Month at Unknown time  . metoprolol tartrate (LOPRESSOR) 25 MG tablet Take 25 mg by mouth every 6 (six) hours.   12/29/2014  . nitroGLYCERIN (NITRODUR - DOSED IN MG/24 HR) 0.4 mg/hr Place 1 patch onto the skin daily.   unknown  . oxyCODONE-acetaminophen (ROXICET) 5-325 MG per tablet Take 1 tablet by mouth every 6 (six) hours as needed for pain. 120 tablet 0 Past Week at Unknown time    Assessment: 59 y.o. male presents with acute on chronic systolic HF. Heparin started 3/22 for aflutter with RVR - and amiodarone drip. CBC stable. Scr stable at 1.7. LFTs elevated at baseline but now trending down - possible liver congestion will follow.   Patient transitioned off heparin to apixaban. For TEE>>afib ablation today. No bleeding issues noted. Continue current dosing.    Plan:  Apixaban  BID Follow renal fx,  lfts and CBC  Sheppard CoilFrank Wilson PharmD., BCPS Clinical Pharmacist Pager [phone] 01/05/2015 1:20 PM

## 2015-01-05 NOTE — Progress Notes (Signed)
Calcium chloride 1 gm given slowly per Dr Katrinka BlazingSmith

## 2015-01-06 ENCOUNTER — Inpatient Hospital Stay (HOSPITAL_COMMUNITY): Payer: Medicare Other

## 2015-01-06 DIAGNOSIS — R0902 Hypoxemia: Secondary | ICD-10-CM | POA: Insufficient documentation

## 2015-01-06 DIAGNOSIS — J810 Acute pulmonary edema: Secondary | ICD-10-CM | POA: Insufficient documentation

## 2015-01-06 LAB — CBC
HEMATOCRIT: 36.2 % — AB (ref 39.0–52.0)
Hemoglobin: 11.6 g/dL — ABNORMAL LOW (ref 13.0–17.0)
MCH: 27 pg (ref 26.0–34.0)
MCHC: 32 g/dL (ref 30.0–36.0)
MCV: 84.4 fL (ref 78.0–100.0)
Platelets: 195 10*3/uL (ref 150–400)
RBC: 4.29 MIL/uL (ref 4.22–5.81)
RDW: 16.1 % — AB (ref 11.5–15.5)
WBC: 12 10*3/uL — AB (ref 4.0–10.5)

## 2015-01-06 LAB — GLUCOSE, CAPILLARY
Glucose-Capillary: 166 mg/dL — ABNORMAL HIGH (ref 70–99)
Glucose-Capillary: 202 mg/dL — ABNORMAL HIGH (ref 70–99)
Glucose-Capillary: 207 mg/dL — ABNORMAL HIGH (ref 70–99)

## 2015-01-06 LAB — COMPREHENSIVE METABOLIC PANEL
ALK PHOS: 156 U/L — AB (ref 39–117)
ALT: 174 U/L — AB (ref 0–53)
ANION GAP: 6 (ref 5–15)
AST: 85 U/L — ABNORMAL HIGH (ref 0–37)
Albumin: 2.7 g/dL — ABNORMAL LOW (ref 3.5–5.2)
BUN: 25 mg/dL — AB (ref 6–23)
CHLORIDE: 102 mmol/L (ref 96–112)
CO2: 28 mmol/L (ref 19–32)
Calcium: 8.2 mg/dL — ABNORMAL LOW (ref 8.4–10.5)
Creatinine, Ser: 2.15 mg/dL — ABNORMAL HIGH (ref 0.50–1.35)
GFR calc non Af Amer: 32 mL/min — ABNORMAL LOW (ref >90)
GFR, EST AFRICAN AMERICAN: 37 mL/min — AB (ref >90)
Glucose, Bld: 200 mg/dL — ABNORMAL HIGH (ref 70–99)
Potassium: 3.6 mmol/L (ref 3.5–5.1)
Sodium: 136 mmol/L (ref 135–145)
TOTAL PROTEIN: 5.3 g/dL — AB (ref 6.0–8.3)
Total Bilirubin: 2.4 mg/dL — ABNORMAL HIGH (ref 0.3–1.2)

## 2015-01-06 LAB — CARBOXYHEMOGLOBIN
Carboxyhemoglobin: 1.8 % — ABNORMAL HIGH (ref 0.5–1.5)
Methemoglobin: 0.8 % (ref 0.0–1.5)
O2 Saturation: 71 %
TOTAL HEMOGLOBIN: 11.6 g/dL — AB (ref 13.5–18.0)

## 2015-01-06 MED ORDER — TORSEMIDE 20 MG PO TABS
40.0000 mg | ORAL_TABLET | Freq: Two times a day (BID) | ORAL | Status: DC
  Administered 2015-01-06 – 2015-01-08 (×5): 40 mg via ORAL
  Filled 2015-01-06 (×8): qty 2

## 2015-01-06 MED ORDER — ATORVASTATIN CALCIUM 80 MG PO TABS
80.0000 mg | ORAL_TABLET | Freq: Every day | ORAL | Status: DC
  Administered 2015-01-06 – 2015-01-12 (×7): 80 mg via ORAL
  Filled 2015-01-06 (×7): qty 1

## 2015-01-06 MED ORDER — INSULIN ASPART 100 UNIT/ML ~~LOC~~ SOLN
0.0000 [IU] | Freq: Three times a day (TID) | SUBCUTANEOUS | Status: DC
  Administered 2015-01-07: 7 [IU] via SUBCUTANEOUS
  Administered 2015-01-07: 4 [IU] via SUBCUTANEOUS
  Administered 2015-01-07: 11 [IU] via SUBCUTANEOUS
  Administered 2015-01-08 (×2): 4 [IU] via SUBCUTANEOUS
  Administered 2015-01-08: 11 [IU] via SUBCUTANEOUS
  Administered 2015-01-09: 7 [IU] via SUBCUTANEOUS
  Administered 2015-01-09: 3 [IU] via SUBCUTANEOUS
  Administered 2015-01-09 – 2015-01-10 (×3): 4 [IU] via SUBCUTANEOUS
  Administered 2015-01-11: 3 [IU] via SUBCUTANEOUS
  Administered 2015-01-11: 7 [IU] via SUBCUTANEOUS

## 2015-01-06 NOTE — Progress Notes (Signed)
Patient ID: Daniel Blankenship, male   DOB: July 07, 1956, 59 y.o.   MRN: [id number]   SUBJECTIVE:   Underwent successful atrial flutter ablation on 3/25.  He was lethargic post-extubation so was re-intubated.  He then woke up and self-extubated.  Amiodarone stopped. Maintaining NSR. Remains on lasix gtt. Feels good. CVP 12-13  Co-ox ok at 71% but creatinine continues to climb. 1.5->1.7->1.9->2.1    Scheduled Meds: . apixaban  5 mg Oral BID  . digoxin  0.125 mg Oral Daily  . docusate sodium  200 mg Oral Daily  . fentaNYL      . insulin aspart  0-20 Units Subcutaneous TID WC  . insulin aspart  0-5 Units Subcutaneous QHS  . insulin glargine  40 Units Subcutaneous QHS  . off the beat book   Does not apply Once  . pantoprazole (PROTONIX) IV  40 mg Intravenous Daily  . sodium chloride  10-40 mL Intracatheter Q12H  . sodium chloride  3 mL Intravenous Q12H  . spironolactone  12.5 mg Oral Daily   Continuous Infusions: . furosemide (LASIX) infusion 10 mg/hr (01/05/15 2000)  . milrinone 0.375 mcg/kg/min (01/05/15 2003)   PRN Meds:.sodium chloride, acetaminophen, ALPRAZolam, HYDROmorphone (DILAUDID) injection, ondansetron (ZOFRAN) IV, oxyCODONE-acetaminophen, promethazine, sodium chloride, sodium chloride    Filed Vitals:   01/06/15 0000 01/06/15 0100 01/06/15 0200 01/06/15 0300  BP: 104/69 100/65 104/74 93/61  Pulse: 106 99 99 104  Temp: 98.2 F (36.8 C)     TempSrc: Oral     Resp: 43  Height:      Weight:      SpO2: 98% 93% 95% 99%    Intake/Output Summary (Last 24 hours) at 01/06/15 0335 Last data filed at 01/06/15 0300  Gross per 24 hour  Intake 1853.25 ml  Output   1575 ml  Net 278.25 ml    LABS: Basic Metabolic Panel:  Recent Labs  78/29/56 0430 01/06/15 0245  NA 134* 136  K 4.4 3.6  CL 101 102  CO2 23 28  GLUCOSE 284* 200*  BUN 25* 25*  CREATININE 1.92* 2.15*  CALCIUM 7.6* 8.2*   Liver Function Tests:  Recent Labs  01/05/15 0430 01/06/15 0245   AST 111* 85*  ALT 216* 174*  ALKPHOS 184* 156*  BILITOT 2.8* 2.4*  PROT 5.3* 5.3*  ALBUMIN 2.7* 2.7*    Recent Labs  01/03/15 0830  LIPASE 28   CBC:  Recent Labs  01/05/15 0430 01/06/15 0245  WBC 10.8* 12.0*  HGB 12.2* 11.6*  HCT 38.3* 36.2*  MCV 84.0 84.4  PLT 208 195   Cardiac Enzymes: No results for input(s): CKTOTAL, CKMB, CKMBINDEX, TROPONINI in the last 72 hours. BNP: Invalid input(s): POCBNP D-Dimer: No results for input(s): DDIMER in the last 72 hours. Hemoglobin A1C: No results for input(s): HGBA1C in the last 72 hours. Fasting Lipid Panel: No results for input(s): CHOL, HDL, LDLCALC, TRIG, CHOLHDL, LDLDIRECT in the last 72 hours. Thyroid Function Tests: No results for input(s): TSH, T4TOTAL, T3FREE, THYROIDAB in the last 72 hours.  Invalid input(s): FREET3 Anemia Panel:  Recent Labs  01/05/15 0430  FERRITIN 377*    RADIOLOGY: Ct Abdomen Pelvis Wo Contrast  01/03/2015   CLINICAL DATA:  Abdominal pain, elevated LFTs and liver failure. Nausea.  EXAM: CT ABDOMEN AND PELVIS WITHOUT CONTRAST  TECHNIQUE: Multidetector CT imaging of the abdomen and pelvis was performed following the standard protocol without IV contrast.  COMPARISON:  Abdominal ultrasound 01/01/2015  FINDINGS: Small right  pleural effusion. The heart is enlarged. There is bibasilar atelectasis in the lower lobes.  Liver is prominent in size with decreased density, suspect steatosis. There is decreased density adjacent to the falciform ligament that may reflect more focal steatosis. The questioned lesion on ultrasound is not well seen, detailed evaluation limited given lack of intravenous contrast. The gallbladder is physiologically distended. Probable sludge in the gallbladder lumen. No pericholecystic fluid. Common bile duct is not well visualized.  Spleen is normal in size. Pancreas and adrenal glands are normal. There is no hydronephrosis or perinephric stranding. No renal stones. Suspect 1.3 cm  cyst in the lower right kidney.  Stomach is decompressed. There are no dilated or thickened bowel loops. The appendix is not definitively identified. Small volume of stool throughout the colon. There is oral contrast throughout the colon.  The abdominal aorta is normal in caliber, densely calcified. No retroperitoneal adenopathy. No free air, free fluid, or intra-abdominal fluid collection. No ascites. There is a small to moderate fat containing umbilical hernia.  Within the pelvis the bladder is minimally distended. Prostate gland is normal in size. No pelvic free fluid or ascites. No pelvic adenopathy.  There is degenerative change in the lower lumbar spine. No acute osseous abnormality or focal osseous lesion.  IMPRESSION: 1. Decreased hepatic density, suspect hepatic steatosis. There is more focal fatty infiltration adjacent to the falciform ligament of the liver. The lesion on ultrasound is not seen without IV contrast, characterization recommended with MRI. 2. Probable sludge in the gallbladder. 3. Small right pleural effusion, new from prior exam. 4. Fat containing umbilical hernia.   Electronically Signed   By: Rubye Oaks M.D.   On: 01/03/2015 05:40   Ct Angio Chest Pe W/cm &/or Wo Cm  12/31/2014   CLINICAL DATA:  Shortness of breath. Abdominal pain. Productive cough.  EXAM: CT ANGIOGRAPHY CHEST WITH CONTRAST  TECHNIQUE: Multidetector CT imaging of the chest was performed using the standard protocol during bolus administration of intravenous contrast. Multiplanar CT image reconstructions and MIPs were obtained to evaluate the vascular anatomy.  CONTRAST:  OMNIPAQUE IOHEXOL 350 MG/ML SOLN  COMPARISON:  Chest x-ray dated 12/31/2014  FINDINGS: There are no pulmonary emboli or infiltrates or effusions. There is a 5 mm peripheral nodule in the right lower lobe on image 58 of series 6. The patient has mediastinal and hilar adenopathy. There is a 13 mm node just to the left of the aortic arch on  image 29, and azygos node measuring 13 mm on image 33, and a prominent nodal mass measuring 6.6 x 2.7 x 4.8 cm in the subcarinal region. Part of the soft tissue density represents the esophagus but I think of the scar this is adenopathy.  There is cardiomegaly with dilatation of the left ventricle.  No osseous abnormality. The visualized portion of the upper abdomen is normal.  Review of the MIP images confirms the above findings.  IMPRESSION: 1. No pulmonary emboli. 2. Cardiomegaly with dilatation of the right ventricle. 3. Mediastinal and hilar adenopathy of unknown etiology. The possibility of malignancy should be considered. 4. 5 mm nodule in the periphery of the right lower lobe. This should be followed up in 6 months with chest CT without contrast if the adenopathy is not determined to be malignant.   Electronically Signed   By: Francene Boyers M.D.   On: 12/31/2014 17:33   Portable Chest Xray  01/05/2015   CLINICAL DATA:  Acute respiratory failure  EXAM: PORTABLE CHEST -  1 VIEW  COMPARISON:  January 02, 2015  FINDINGS: There are pleural effusions bilaterally with cardiomegaly and mild pulmonary venous hypertension. There is mild airspace consolidation in the left base, stable. There is no new opacity. Central catheter tip is in the superior vena cava. Pacemaker lead is attached to the right ventricle. No pneumothorax.  IMPRESSION: Evidence of a degree of congestive heart failure. Question atelectasis or pneumonia superimposed in the left base. No new opacity compared to recent prior study.   Electronically Signed   By: Bretta BangWilliam  Woodruff III M.D.   On: 01/05/2015 18:10   Dg Chest Port 1 View  01/02/2015   CLINICAL DATA:  Left central line insertion  EXAM: PORTABLE CHEST - 1 VIEW  COMPARISON:  12/31/2014  FINDINGS: New left IJ catheter, tip at the SVC level. No interval displacement of the right sided pacer/ICD.  Increased hazy density of the lower chest is likely from soft tissue attenuation. There is  ongoing pulmonary venous congestion. No pneumothorax.  IMPRESSION: 1. New left IJ catheter is in good position.  No pneumothorax. 2. Unchanged cardiomegaly and pulmonary venous congestion.   Electronically Signed   By: Marnee SpringJonathon  Watts M.D.   On: 01/02/2015 19:54   Dg Chest Port 1 View  12/31/2014   CLINICAL DATA:  Shortness of breath and chest pain  EXAM: PORTABLE CHEST - 1 VIEW  COMPARISON:  None.  FINDINGS: Cardiac shadow is enlarged. A defibrillator is noted. No focal infiltrate or sizable effusion is seen.  IMPRESSION: No active disease.   Electronically Signed   By: Alcide CleverMark  Lukens M.D.   On: 12/31/2014 14:14   [place] Abdomen Limited Ruq  01/01/2015   CLINICAL DATA:  Abnormal LFTs  EXAM: US ABDOMEN LIMITED - RIGHT UPPER QUADRANT  COMPARISON:  None.  FINDINGS: Gallbladder:  There is intraluminal sludge. Gallbladder wall thickness is normal, 2.0 mm. The patient was not tender over the gallbladder.  Common bile duct:  Diameter: 5.2 mm, normal  Liver:  The liver is enlarged, over 20 cm craniocaudal. It is diffusely increased in echogenicity consistent with fatty infiltration. There is a 2.5 cm focal hyperechoic homogeneous circumscribed lesion which likely represents a benign cavernous hemangioma.  IMPRESSION: 1. Enlarged liver with abnormally increased echogenicity. Fatty infiltration or other liver disease can produce this appearance. 2. 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 MRI with contrast would conclusively characterize this abnormality.   Electronically Signed   By: Ellery Plunkaniel R Mitchell M.D.   On: 01/01/2015 06:05    PHYSICAL EXAM General: NAD Neck: JVP 12-13 cm, no thyromegaly or thyroid nodule.  Lungs: Slight crackles at bases.  CV: Lateral PMI.  Heart tachy, irregular S1/S2, +S3 but tachy, no murmur.  trankle edema.   Abdomen: Soft, NT  no hepatosplenomegaly, no distention.  Neurologic: Alert and oriented x  3.  Psych: Normal affect. Extremities: No clubbing or cyanosis.   TELEMETRY: Reviewed telemetry pt in NSR/sinus tach  ASSESSMENT AND PLAN: 59 yo with history of CAD s/p multivessel PCI, ischemic cardiomyopathy with systolic CHF, CKD, and atrial flutter presented with atrial flutter/RVR and acute on chronic systolic CHF.   1. Acute on chronic systolic CHF: Ischemic cardiomyopathy, EF 15% by echo this admission.  He was started on milrinone due to concern for low output and sent to CCU, line was placed.  He became more alert on milrinone.  Initially he diuresed well but this has slowed and creatinine rising  again, 1.5 => 1.9. -> 2.1 -  Hydralazine/Imdur on hold with soft BP.  - Continue spironolactone.  - Unclear why creatinine rising int he face of normal co-ox and elevated CVP. ?ATN. Will hold lasix today and start oral torsemide and follow,  - Per Dr. Shirlee Latch. Will need formal RHC after diuresis prior to discharge.  As he will be anticoagulated, would try to do this from right brachial IV.  - He was evaluated for advanced therapies 2 years ago at [facility] and deemed to be a poor candidate (apparently compliance issues and lives alone).  He has children but they do not live with him, not sure he would have much help at home.  We will need to consider his candidacy for LVAD/transplant going forward but doubt he will be candidate. .   2. Atrial flutter:  - S/p AFL ablation 3/25. Off  amio - Continue Eliquis 5 mg bid.   3. AKI on CKD: . Creatinine initially better on milrinone, now slowly worsening despite good co-ox. Stop lasix. Start torsemide 4. CAD: h/o PCI.  Slight troponin elevation, suspect demand ischemia from CHF. LFTs coming down. Will restart statin.   Can go to SDU    Arvilla Meres MD 01/06/2015 3:35 AM

## 2015-01-06 NOTE — Progress Notes (Signed)
[facility] Progress Note  Subjective: Lower abdominal pain resolved and has not recurred  Objective: Vital signs in last 24 hours: Temp:  [97.6 F (36.4 Blankenship)-98.4 F (36.9 Blankenship)] 98.2 F (36.8 Blankenship) (03/26 0000) Pulse Rate:  [94-123] 104 (03/26 0908) Resp:  [7-43] 17 (03/26 0600) BP: (88-121)/(58-90) 99/66 mmHg (03/26 0600) SpO2:  [88 %-100 %] 97 % (03/26 0600) FiO2 (%):  [40 %-60 %] 60 % (03/25 1456) Weight:  [134.4 kg (296 lb 4.8 oz)] 134.4 kg (296 lb 4.8 oz) (03/26 0420) Weight change: 1.7 kg (3 lb 12 oz)   PE: Abdomen soft nondistended with normoactive bowel sounds.  Lab Results: Results for orders placed or performed during the hospital encounter of 12/31/14 (from the past 24 hour(s))  Glucose, capillary     Status: Abnormal   Collection Time: 01/05/15 10:30 AM  Result Value Ref Range   Glucose-Capillary 244 (H) 70 - 99 mg/dL  Glucose, capillary     Status: Abnormal   Collection Time: 01/05/15  2:00 PM  Result Value Ref Range   Glucose-Capillary 291 (H) 70 - 99 mg/dL   Comment 1 Notify RN    Comment 2 Document in Chart   Glucose, capillary     Status: Abnormal   Collection Time: 01/05/15  4:33 PM  Result Value Ref Range   Glucose-Capillary 303 (H) 70 - 99 mg/dL   Comment 1 Capillary Specimen   Draw ABG 1 hour after initiation of ventilator     Status: Abnormal   Collection Time: 01/05/15  6:45 PM  Result Value Ref Range   O2 Content 3.0 L/min   Delivery systems NASAL CANNULA    pH, Arterial 7.348 (L) 7.350 - 7.450   pCO2 arterial 37.4 35.0 - 45.0 mmHg   pO2, Arterial 41.0 (L) 80.0 - 100.0 mmHg   Bicarbonate 20.0 20.0 - 24.0 mEq/L   TCO2 21.1 0 - 100 mmol/L   Acid-base deficit 4.6 (H) 0.0 - 2.0 mmol/L   O2 Saturation 67.7 %   Patient temperature 98.9    Collection site A-LINE    Drawn by COLLECTED BY NURSE    Sample type ARTERIAL DRAW   Carboxyhemoglobin     Status: Abnormal   Collection Time: 01/05/15  9:00 PM  Result Value Ref Range   Total  hemoglobin 12.1 (L) 13.5 - 18.0 g/dL   O2 Saturation 16.166.7 %   Carboxyhemoglobin 1.7 (H) 0.5 - 1.5 %   Methemoglobin 0.9 0.0 - 1.5 %  Glucose, capillary     Status: Abnormal   Collection Time: 01/05/15  9:57 PM  Result Value Ref Range   Glucose-Capillary 242 (H) 70 - 99 mg/dL  Carboxyhemoglobin     Status: Abnormal   Collection Time: 01/06/15  2:35 AM  Result Value Ref Range   Total hemoglobin 11.6 (L) 13.5 - 18.0 g/dL   O2 Saturation 09.671.0 %   Carboxyhemoglobin 1.8 (H) 0.5 - 1.5 %   Methemoglobin 0.8 0.0 - 1.5 %  CBC     Status: Abnormal   Collection Time: 01/06/15  2:45 AM  Result Value Ref Range   WBC 12.0 (H) 4.0 - 10.5 K/uL   RBC 4.29 4.22 - 5.81 MIL/uL   Hemoglobin 11.6 (L) 13.0 - 17.0 g/dL   HCT 04.536.2 (L) 40.939.0 - 81.152.0 %   MCV 84.4 78.0 - 100.0 fL   MCH 27.0 26.0 - 34.0 pg   MCHC 32.0 30.0 - 36.0 g/dL   RDW 91.416.1 (H) 78.211.5 - 95.615.5 %  Platelets 195 150 - 400 K/uL  Comprehensive metabolic panel     Status: Abnormal   Collection Time: 01/06/15  2:45 AM  Result Value Ref Range   Sodium 136 135 - 145 mmol/L   Potassium 3.6 3.5 - 5.1 mmol/L   Chloride 102 96 - 112 mmol/L   CO2 28 19 - 32 mmol/L   Glucose, Bld 200 (H) 70 - 99 mg/dL   BUN 25 (H) 6 - 23 mg/dL   Creatinine, Ser 1.61 (H) 0.50 - 1.35 mg/dL   Calcium 8.2 (L) 8.4 - 10.5 mg/dL   Total Protein 5.3 (L) 6.0 - 8.3 g/dL   Albumin 2.7 (L) 3.5 - 5.2 g/dL   AST 85 (H) 0 - 37 U/L   ALT 174 (H) 0 - 53 U/L   Alkaline Phosphatase 156 (H) 39 - 117 U/L   Total Bilirubin 2.4 (H) 0.3 - 1.2 mg/dL   GFR calc non Af Amer 32 (L) >90 mL/min   GFR calc Af Amer 37 (L) >90 mL/min   Anion gap 6 5 - 15  Glucose, capillary     Status: Abnormal   Collection Time: 01/06/15  8:02 AM  Result Value Ref Range   Glucose-Capillary 166 (H) 70 - 99 mg/dL   Comment 1 Venous Specimen     Studies/Results: Portable Chest Xray  01/06/2015   CLINICAL DATA:  Acute respiratory failure  EXAM: PORTABLE CHEST - 1 VIEW  COMPARISON:  January 05, 2015   FINDINGS: Central catheter tip is in the superior vena cava. There is a pacemaker present with leads attached to the right ventricle. No pneumothorax. There is stable cardiomegaly with mild pulmonary venous hypertension. There is airspace consolidation the left base. There are small layering effusions. No new opacity.  IMPRESSION: Findings consistent with a degree of congestive heart failure. No new opacity. No pneumothorax.   Electronically Signed   By: Bretta Bang III M.D.   On: 01/06/2015 07:51   Portable Chest Xray  01/05/2015   CLINICAL DATA:  Acute respiratory failure  EXAM: PORTABLE CHEST - 1 VIEW  COMPARISON:  January 02, 2015  FINDINGS: There are pleural effusions bilaterally with cardiomegaly and mild pulmonary venous hypertension. There is mild airspace consolidation in the left base, stable. There is no new opacity. Central catheter tip is in the superior vena cava. Pacemaker lead is attached to the right ventricle. No pneumothorax.  IMPRESSION: Evidence of a degree of congestive heart failure. Question atelectasis or pneumonia superimposed in the left base. No new opacity compared to recent prior study.   Electronically Signed   By: Bretta Bang III M.D.   On: 01/05/2015 18:10      Assessment: Elevated LFTs with negative hepatitis B and Blankenship serologies, minimally elevated ferritin, transaminases improving possibly related to right heart failure. Do not think this represents a biliary obstructive event  Plan: For now we'll simply follow liver function tests which seemed to be improving and hold off on EUS, ERCP etc.    Daniel Blankenship 01/06/2015, 9:23 AM

## 2015-01-06 NOTE — Progress Notes (Addendum)
CARDIAC REHAB PHASE I   PRE:  Rate/Rhythm: 106   BP:  Supine:   Sitting: 107/72  Standing:   SaO2: 96  2lnc  MODE:  Ambulation: 50 ft 2 standing breaks   POST:  Rate/Rhythm: 113   BP:  Supine:   Sitting: 109/51  Standing:    SaO2: 98 2lnc  Ambulated 50 ft x 2 assist and RW.  Gait steady but slow.  Encouraged often to stand up verses leaning over walker while walking.  Pt would stand up briefly then returned to leaning over walker. Pt took two standing/leaning over walker. Pt unable to progress with ambulation further due to fatigue.  Pt back to bed per his request.  Offered for pt to get up in chair he declined. Call bell in place, ice provided.  Advised pt to ambulate in hallway with nursing staff later today. Alanson Alyarlette Carlton RN, BSN  [phone]

## 2015-01-06 NOTE — Progress Notes (Signed)
PULMONARY / CRITICAL CARE MEDICINE   Name: Daniel Blankenship MRN: [id number] DOB: 1956-04-14    ADMISSION DATE:  12/31/2014 CONSULTATION DATE:  01/06/2015  REFERRING MD :  Allred  CHIEF COMPLAINT:  resp failure requiring reintubation in PACU  INITIAL PRESENTATION: 59 y.o with EF 15% on milrinone gtt, CKD ,underwent ablation for atrial flutter. Failed extubation & reintubated in cath lab. We are asked to assume care  STUDIES:  3/20 abd [place] >> GB sludge 3/22 echo -EF 15%, diffuse hypo 3/23 Ct abdoen >> fatty liver  SIGNIFICANT EVENTS: 3/25 intra-op TEE 3/25 flutter ablation   HISTORY OF PRESENT ILLNESS:  59 year old male with past mental history of systolic heart failure with cardiomyopathy status post [name], diabetes mellitus, CKD, adm 3/20 with pedal edema, acute systolic CHF & elevated LFTs H/o atrial flutter, was cardioverted at [facility] in 2014. He was put on milrinone gtt & amio gtt, but rate control was difficult, TEE neg. Failed extubation post ablation, Given sugammadex to reverse rocronium, reintubated & taken to PACU .  PAST MEDICAL HISTORY :   has a past medical history of Diabetes mellitus without complication; Atrial flutter with rapid ventricular response; Acute renal failure; Vascular dementia; CHF (congestive heart failure); Delirium; Ischemic cardiomyopathy; Septic shock; Pneumonia; CAD (coronary artery disease); Anemia; Hypertension; Hyperlipidemia; and Anxiety.  has past surgical history that includes Cardioversion; Joint replacement; Coronary angioplasty with stent; Cardiac defibrillator placement; and Atrial flutter ablation (N/A, 01/05/2015). Prior to Admission medications   Medication Sig Start Date End Date Taking? Authorizing Provider  ALPRAZolam (NIRAVAM) 0.5 MG dissolvable tablet Take one tablet by mouth three times daily after meals. Hold for sedation. 04/05/13  Yes Sharon Seller, NP  aspirin 81 MG tablet Take 81 mg by mouth daily.   Yes Historical Provider, MD   atorvastatin (LIPITOR) 80 MG tablet Take 40 mg by mouth daily.    Yes Historical Provider, MD  docusate sodium (COLACE) 100 MG capsule Take 200 mg by mouth daily.   Yes Historical Provider, MD  metoprolol tartrate (LOPRESSOR) 25 MG tablet Take 25 mg by mouth every 6 (six) hours.   Yes Historical Provider, MD  nitroGLYCERIN (NITRODUR - DOSED IN MG/24 HR) 0.4 mg/hr Place 1 patch onto the skin daily.   Yes Historical Provider, MD  oxyCODONE-acetaminophen (ROXICET) 5-325 MG per tablet Take 1 tablet by mouth every 6 (six) hours as needed for pain. 04/14/13  Yes Tiffany L Reed, DO   Allergies  Allergen Reactions  . Lisinopril   . Sulfa Antibiotics   . Zocor [Simvastatin]     FAMILY HISTORY:  has no family status information on file.  SOCIAL HISTORY:  reports that he has never smoked. He does not have any smokeless tobacco history on file. He reports that he does not drink alcohol.  REVIEW OF SYSTEMS:  Unable since intubated  SUBJECTIVE:   VITAL SIGNS: Temp:  [97.6 F (36.4 C)-98.4 F (36.9 C)] 98.2 F (36.8 C) (03/26 0000) Pulse Rate:  [0-123] 98 (03/26 0600) Resp:  [7-43] 17 (03/26 0600) BP: (75-121)/(51-90) 99/66 mmHg (03/26 0600) SpO2:  [84 %-100 %] 97 % (03/26 0600) FiO2 (%):  [40 %-60 %] 60 % (03/25 1456) Weight:  [134.4 kg (296 lb 4.8 oz)] 134.4 kg (296 lb 4.8 oz) (03/26 0420) HEMODYNAMICS: CVP:  [12 mmHg-15 mmHg] 12 mmHg VENTILATOR SETTINGS: INTAKE / OUTPUT:  Intake/Output Summary (Last 24 hours) at 01/06/15 0843 Last data filed at 01/06/15 0600  Gross per 24 hour  Intake 1746.54 ml  Output   1875 ml  Net -128.46 ml    PHYSICAL EXAMINATION: Gen. Pleasant, obese, in no distress, normal affect ENT - no lesions, no post nasal drip Neck: No JVD, no thyromegaly, no carotid bruits, supple Lungs: Comfortable on nasal cannula Cardiovascular: Rhythm regular, heart sounds  normal, no murmurs or gallops, no peripheral edema Abdomen: soft and non-tender, no  hepatosplenomegaly, BS normal. Musculoskeletal: No deformities, no cyanosis or clubbing, 1+ edema Neuro:  alert, non focal, no tremors   LABS:  CBC  Recent Labs Lab 01/04/15 0440 01/05/15 0430 01/06/15 0245  WBC 9.4 10.8* 12.0*  HGB 11.8* 12.2* 11.6*  HCT 35.8* 38.3* 36.2*  PLT 141* 208 195   Coag's  Recent Labs Lab 01/02/15 1530  INR 1.63*   BMET  Recent Labs Lab 01/04/15 1646 01/05/15 0430 01/06/15 0245  NA 133* 134* 136  K 3.8 4.4 3.6  CL 101 101 102  CO2 23 23 28   BUN 25* 25* 25*  CREATININE 1.72* 1.92* 2.15*  GLUCOSE 222* 284* 200*   Electrolytes  Recent Labs Lab 01/04/15 1646 01/05/15 0430 01/06/15 0245  CALCIUM 7.5* 7.6* 8.2*   Sepsis Markers No results for input(s): LATICACIDVEN, PROCALCITON, O2SATVEN in the last 168 hours. ABG  Recent Labs Lab 01/05/15 1845  PHART 7.348*  PCO2ART 37.4  PO2ART 41.0*   Liver Enzymes  Recent Labs Lab 01/04/15 0440 01/05/15 0430 01/06/15 0245  AST 249* 111* 85*  ALT 282* 216* 174*  ALKPHOS 194* 184* 156*  BILITOT 2.9* 2.8* 2.4*  ALBUMIN 2.6* 2.7* 2.7*   Cardiac Enzymes  Recent Labs Lab 12/31/14 2100 01/01/15 0506  TROPONINI 0.06* 0.06*   Glucose  Recent Labs Lab 01/04/15 2143 01/05/15 1030 01/05/15 1400 01/05/15 1633 01/05/15 2157 01/06/15 0802  GLUCAP 198* 244* 291* 303* 242* 166*    Imaging Portable Chest Xray  01/05/2015   CLINICAL DATA:  Acute respiratory failure  EXAM: PORTABLE CHEST - 1 VIEW  COMPARISON:  January 02, 2015  FINDINGS: There are pleural effusions bilaterally with cardiomegaly and mild pulmonary venous hypertension. There is mild airspace consolidation in the left base, stable. There is no new opacity. Central catheter tip is in the superior vena cava. Pacemaker lead is attached to the right ventricle. No pneumothorax.  IMPRESSION: Evidence of a degree of congestive heart failure. Question atelectasis or pneumonia superimposed in the left base. No new opacity  compared to recent prior study.   Electronically Signed   By: Bretta BangWilliam  Woodruff III M.D.   On: 01/05/2015 18:10     ASSESSMENT / PLAN:  PULMONARY OETT3/25 >>3/25 A:Acute resp ailure post op P:   Self extubated, titrate O2 for sat of 88-92%. IS per RT protocol Mobilize as able.  CARDIOVASCULAR CVL3/22 >> A: Cardiomyopathy Acute systolic CHF Atrial flutte s/p ablation P:  Milrinone gtt Amio gtt per cards Further management per cards.  RENAL A:  AKI on CKD, renal function deteriorating. P:   Follow electrolytes Hydrate Replace electrolytes as indicated.  GASTROINTESTINAL A:  Elevated LFTs due to ? Cardiac cirrhosis P:   Diet as ordered. Follow  HEMATOLOGIC A:  No issues P:  CBC daily. Transfusion per ICU protocol  INFECTIOUS A:  No issues P:   Monitor WBC and fever curve.  ENDOCRINE A:  DM-2   P:   SSI CBGs  NEUROLOGIC A:  Agitation P:   D/C sedation.  FAMILY  - Updates: no family at bedside  - Inter-disciplinary family meet or Palliative Care meeting due by:  4/1  TODAY SUMMARY: Self extubated overnight, doing well.  Discussed with cardiology, they will manage the patient at this point.  I reviewed the CXR myself persistent pulmonary edema but renal function precludes further diureses at this point.  Will transfer care back to cardiology.  PCCM will sign off, please call back if needed.  Alyson Reedy, M.D. [facility] Pulmonary/Critical Care Medicine. Pager: [phone]. After hours pager: [phone].   01/06/2015, 8:43 AM

## 2015-01-07 LAB — GLUCOSE, CAPILLARY
GLUCOSE-CAPILLARY: 298 mg/dL — AB (ref 70–99)
GLUCOSE-CAPILLARY: 263 mg/dL — AB (ref 70–99)
Glucose-Capillary: 225 mg/dL — ABNORMAL HIGH (ref 70–99)
Glucose-Capillary: 164 mg/dL — ABNORMAL HIGH (ref 70–99)
Glucose-Capillary: 306 mg/dL — ABNORMAL HIGH (ref 70–99)

## 2015-01-07 LAB — COMPREHENSIVE METABOLIC PANEL
ALT: 124 U/L — ABNORMAL HIGH (ref 0–53)
ANION GAP: 7 (ref 5–15)
AST: 54 U/L — ABNORMAL HIGH (ref 0–37)
Albumin: 2.5 g/dL — ABNORMAL LOW (ref 3.5–5.2)
Alkaline Phosphatase: 124 U/L — ABNORMAL HIGH (ref 39–117)
BILIRUBIN TOTAL: 2.5 mg/dL — AB (ref 0.3–1.2)
BUN: 23 mg/dL (ref 6–23)
CO2: 31 mmol/L (ref 19–32)
Calcium: 7.9 mg/dL — ABNORMAL LOW (ref 8.4–10.5)
Chloride: 97 mmol/L (ref 96–112)
Creatinine, Ser: 1.98 mg/dL — ABNORMAL HIGH (ref 0.50–1.35)
GFR calc Af Amer: 41 mL/min — ABNORMAL LOW (ref >90)
GFR, EST NON AFRICAN AMERICAN: 35 mL/min — AB (ref >90)
Glucose, Bld: 212 mg/dL — ABNORMAL HIGH (ref 70–99)
Potassium: 3.1 mmol/L — ABNORMAL LOW (ref 3.5–5.1)
Sodium: 135 mmol/L (ref 135–145)
Total Protein: 5.2 g/dL — ABNORMAL LOW (ref 6.0–8.3)

## 2015-01-07 LAB — CARBOXYHEMOGLOBIN
Carboxyhemoglobin: 2.4 % — ABNORMAL HIGH (ref 0.5–1.5)
Methemoglobin: 0.9 % (ref 0.0–1.5)
O2 Saturation: 79.8 %
Total hemoglobin: 10.9 g/dL — ABNORMAL LOW (ref 13.5–18.0)

## 2015-01-07 LAB — CBC
HCT: 33.8 % — ABNORMAL LOW (ref 39.0–52.0)
Hemoglobin: 10.9 g/dL — ABNORMAL LOW (ref 13.0–17.0)
MCH: 27 pg (ref 26.0–34.0)
MCHC: 32.2 g/dL (ref 30.0–36.0)
MCV: 83.7 fL (ref 78.0–100.0)
PLATELETS: 170 10*3/uL (ref 150–400)
RBC: 4.04 MIL/uL — AB (ref 4.22–5.81)
RDW: 15.5 % (ref 11.5–15.5)
WBC: 7.9 10*3/uL (ref 4.0–10.5)

## 2015-01-07 MED ORDER — POTASSIUM CHLORIDE CRYS ER 20 MEQ PO TBCR
40.0000 meq | EXTENDED_RELEASE_TABLET | Freq: Once | ORAL | Status: AC
  Administered 2015-01-07: 40 meq via ORAL
  Filled 2015-01-07: qty 2

## 2015-01-07 NOTE — Progress Notes (Addendum)
DAILY PROGRESS NOTE  Subjective:   No events overnight. Breathing is better - diuresed another 2.5L overnight. Remains on milrinone. Telemetry shows sinus tach. Creatinine has improved today to 1.98. Hypokalemic.  Objective:  Temp:  [97.5 F (36.4 C)-98.2 F (36.8 C)] 97.8 F (36.6 C) (03/27 1110) Pulse Rate:  [98-106] 98 (03/27 1110) Resp:  [12-29] 19 (03/27 1110) BP: (88-112)/(48-78) 105/65 mmHg (03/27 1110) SpO2:  [96 %-100 %] 99 % (03/27 1110) Weight:  [290 lb 2 oz (131.6 kg)] 290 lb 2 oz (131.6 kg) (03/27 0500) Weight change: -6 lb 2.8 oz (-2.8 kg)  Intake/Output from previous day: 03/26 0701 - 03/27 0700 In: 1265 [P.O.:690; I.V.:575] Out: 2400 [Urine:2400]  Intake/Output from this shift: Total I/O In: 435 [P.O.:360; I.V.:75] Out: 500 [Urine:500]  Medications: Current Facility-Administered Medications  Medication Dose Route Frequency Provider Last Rate Last Dose  . 0.9 %  sodium chloride infusion  250 mL Intravenous PRN Thompson Grayer, MD 10 mL/hr at 01/07/15 0800 250 mL at 01/07/15 0800  . acetaminophen (TYLENOL) tablet 650 mg  650 mg Oral Q4H PRN Thompson Grayer, MD      . ALPRAZolam Duanne Moron) tablet 0.5 mg  0.5 mg Oral TID PRN Domenic Polite, MD   0.5 mg at 01/06/15 2031  . apixaban (ELIQUIS) tablet 5 mg  5 mg Oral BID Larey Dresser, MD   5 mg at 01/07/15 5956  . atorvastatin (LIPITOR) tablet 80 mg  80 mg Oral q1800 Jolaine Artist, MD   80 mg at 01/06/15 1748  . digoxin (LANOXIN) tablet 0.125 mg  0.125 mg Oral Daily Larey Dresser, MD   0.125 mg at 01/07/15 3875  . docusate sodium (COLACE) capsule 200 mg  200 mg Oral Daily Domenic Polite, MD   200 mg at 01/07/15 0902  . insulin aspart (novoLOG) injection 0-20 Units  0-20 Units Subcutaneous TID WC Rounding Lbcardiology, MD   7 Units at 01/07/15 1143  . insulin aspart (novoLOG) injection 0-5 Units  0-5 Units Subcutaneous QHS Annita Brod, MD   3 Units at 01/06/15 2236  . insulin glargine (LANTUS) injection 40  Units  40 Units Subcutaneous QHS Domenic Polite, MD   40 Units at 01/06/15 2236  . milrinone (PRIMACOR) 20 MG/100ML (0.2 mg/mL) infusion  0.375 mcg/kg/min Intravenous Continuous Dayna N Dunn, PA-C 15 mL/hr at 01/07/15 0800 0.375 mcg/kg/min at 01/07/15 0800  . off the beat book   Does not apply Once Vern Claude, Therapist, sports      . ondansetron (ZOFRAN) injection 4 mg  4 mg Intravenous Q6H PRN Thompson Grayer, MD      . oxyCODONE-acetaminophen (PERCOCET/ROXICET) 5-325 MG per tablet 1 tablet  1 tablet Oral Q4H PRN Jeryl Columbia, NP   1 tablet at 01/06/15 2031  . pantoprazole (PROTONIX) injection 40 mg  40 mg Intravenous Daily Kara Mead V, MD   40 mg at 01/07/15 0902  . promethazine (PHENERGAN) injection 12.5 mg  12.5 mg Intravenous Q6H PRN Alphia Moh, MD   12.5 mg at 01/05/15 0320  . sodium chloride 0.9 % injection 10-40 mL  10-40 mL Intracatheter Q12H Annita Brod, MD   10 mL at 01/06/15 0908  . sodium chloride 0.9 % injection 10-40 mL  10-40 mL Intracatheter PRN Annita Brod, MD   10 mL at 01/06/15 0909  . sodium chloride 0.9 % injection 3 mL  3 mL Intravenous Q12H Thompson Grayer, MD   3 mL at 01/05/15 2200  .  sodium chloride 0.9 % injection 3 mL  3 mL Intravenous PRN Thompson Grayer, MD      . spironolactone (ALDACTONE) tablet 12.5 mg  12.5 mg Oral Daily Larey Dresser, MD   12.5 mg at 01/07/15 5462  . torsemide (DEMADEX) tablet 40 mg  40 mg Oral BID Jolaine Artist, MD   40 mg at 01/07/15 7035    Physical Exam: General appearance: alert and no distress Neck: left IJ TLC Lungs: clear to auscultation bilaterally Heart: regular rate and rhythm Extremities: extremities normal, atraumatic, no cyanosis or edema Pulses: 2+ and symmetric Neurologic: Grossly normal  Lab Results: Results for orders placed or performed during the hospital encounter of 12/31/14 (from the past 48 hour(s))  Glucose, capillary     Status: Abnormal   Collection Time: 01/05/15  2:00 PM  Result Value Ref Range    Glucose-Capillary 291 (H) 70 - 99 mg/dL   Comment 1 Notify RN    Comment 2 Document in Chart   Glucose, capillary     Status: Abnormal   Collection Time: 01/05/15  4:33 PM  Result Value Ref Range   Glucose-Capillary 303 (H) 70 - 99 mg/dL   Comment 1 Capillary Specimen   Draw ABG 1 hour after initiation of ventilator     Status: Abnormal   Collection Time: 01/05/15  6:45 PM  Result Value Ref Range   O2 Content 3.0 L/min   Delivery systems NASAL CANNULA    pH, Arterial 7.348 (L) 7.350 - 7.450   pCO2 arterial 37.4 35.0 - 45.0 mmHg   pO2, Arterial 41.0 (L) 80.0 - 100.0 mmHg   Bicarbonate 20.0 20.0 - 24.0 mEq/L   TCO2 21.1 0 - 100 mmol/L   Acid-base deficit 4.6 (H) 0.0 - 2.0 mmol/L   O2 Saturation 67.7 %   Patient temperature 98.9    Collection site A-LINE    Drawn by COLLECTED BY NURSE    Sample type ARTERIAL DRAW   Carboxyhemoglobin     Status: Abnormal   Collection Time: 01/05/15  9:00 PM  Result Value Ref Range   Total hemoglobin 12.1 (L) 13.5 - 18.0 g/dL   O2 Saturation 66.7 %   Carboxyhemoglobin 1.7 (H) 0.5 - 1.5 %   Methemoglobin 0.9 0.0 - 1.5 %  Glucose, capillary     Status: Abnormal   Collection Time: 01/05/15  9:57 PM  Result Value Ref Range   Glucose-Capillary 242 (H) 70 - 99 mg/dL  Carboxyhemoglobin     Status: Abnormal   Collection Time: 01/06/15  2:35 AM  Result Value Ref Range   Total hemoglobin 11.6 (L) 13.5 - 18.0 g/dL   O2 Saturation 71.0 %   Carboxyhemoglobin 1.8 (H) 0.5 - 1.5 %   Methemoglobin 0.8 0.0 - 1.5 %  CBC     Status: Abnormal   Collection Time: 01/06/15  2:45 AM  Result Value Ref Range   WBC 12.0 (H) 4.0 - 10.5 K/uL   RBC 4.29 4.22 - 5.81 MIL/uL   Hemoglobin 11.6 (L) 13.0 - 17.0 g/dL   HCT 36.2 (L) 39.0 - 52.0 %   MCV 84.4 78.0 - 100.0 fL   MCH 27.0 26.0 - 34.0 pg   MCHC 32.0 30.0 - 36.0 g/dL   RDW 16.1 (H) 11.5 - 15.5 %   Platelets 195 150 - 400 K/uL  Comprehensive metabolic panel     Status: Abnormal   Collection Time: 01/06/15   2:45 AM  Result Value Ref Range  Sodium 136 135 - 145 mmol/L   Potassium 3.6 3.5 - 5.1 mmol/L    Comment: DELTA CHECK NOTED   Chloride 102 96 - 112 mmol/L   CO2 28 19 - 32 mmol/L   Glucose, Bld 200 (H) 70 - 99 mg/dL   BUN 25 (H) 6 - 23 mg/dL   Creatinine, Ser 2.15 (H) 0.50 - 1.35 mg/dL   Calcium 8.2 (L) 8.4 - 10.5 mg/dL   Total Protein 5.3 (L) 6.0 - 8.3 g/dL   Albumin 2.7 (L) 3.5 - 5.2 g/dL   AST 85 (H) 0 - 37 U/L   ALT 174 (H) 0 - 53 U/L   Alkaline Phosphatase 156 (H) 39 - 117 U/L   Total Bilirubin 2.4 (H) 0.3 - 1.2 mg/dL   GFR calc non Af Amer 32 (L) >90 mL/min   GFR calc Af Amer 37 (L) >90 mL/min    Comment: (NOTE) The eGFR has been calculated using the CKD EPI equation. This calculation has not been validated in all clinical situations. eGFR's persistently <90 mL/min signify possible Chronic Kidney Disease.    Anion gap 6 5 - 15  Glucose, capillary     Status: Abnormal   Collection Time: 01/06/15  8:02 AM  Result Value Ref Range   Glucose-Capillary 166 (H) 70 - 99 mg/dL   Comment 1 Venous Specimen   Glucose, capillary     Status: Abnormal   Collection Time: 01/06/15 11:30 AM  Result Value Ref Range   Glucose-Capillary 202 (H) 70 - 99 mg/dL  Glucose, capillary     Status: Abnormal   Collection Time: 01/06/15  3:43 PM  Result Value Ref Range   Glucose-Capillary 207 (H) 70 - 99 mg/dL   Comment 1 Capillary Specimen   Glucose, capillary     Status: Abnormal   Collection Time: 01/06/15 10:32 PM  Result Value Ref Range   Glucose-Capillary 298 (H) 70 - 99 mg/dL  CBC     Status: Abnormal   Collection Time: 01/07/15  5:05 AM  Result Value Ref Range   WBC 7.9 4.0 - 10.5 K/uL   RBC 4.04 (L) 4.22 - 5.81 MIL/uL   Hemoglobin 10.9 (L) 13.0 - 17.0 g/dL   HCT 33.8 (L) 39.0 - 52.0 %   MCV 83.7 78.0 - 100.0 fL   MCH 27.0 26.0 - 34.0 pg   MCHC 32.2 30.0 - 36.0 g/dL   RDW 15.5 11.5 - 15.5 %   Platelets 170 150 - 400 K/uL  Comprehensive metabolic panel     Status: Abnormal    Collection Time: 01/07/15  5:05 AM  Result Value Ref Range   Sodium 135 135 - 145 mmol/L   Potassium 3.1 (L) 3.5 - 5.1 mmol/L   Chloride 97 96 - 112 mmol/L   CO2 31 19 - 32 mmol/L   Glucose, Bld 212 (H) 70 - 99 mg/dL   BUN 23 6 - 23 mg/dL   Creatinine, Ser 1.98 (H) 0.50 - 1.35 mg/dL   Calcium 7.9 (L) 8.4 - 10.5 mg/dL   Total Protein 5.2 (L) 6.0 - 8.3 g/dL   Albumin 2.5 (L) 3.5 - 5.2 g/dL   AST 54 (H) 0 - 37 U/L   ALT 124 (H) 0 - 53 U/L   Alkaline Phosphatase 124 (H) 39 - 117 U/L   Total Bilirubin 2.5 (H) 0.3 - 1.2 mg/dL   GFR calc non Af Amer 35 (L) >90 mL/min   GFR calc Af Amer 41 (L) >90 mL/min  Comment: (NOTE) The eGFR has been calculated using the CKD EPI equation. This calculation has not been validated in all clinical situations. eGFR's persistently <90 mL/min signify possible Chronic Kidney Disease.    Anion gap 7 5 - 15  Carboxyhemoglobin     Status: Abnormal   Collection Time: 01/07/15  6:05 AM  Result Value Ref Range   Total hemoglobin 10.9 (L) 13.5 - 18.0 g/dL   O2 Saturation 79.8 %   Carboxyhemoglobin 2.4 (H) 0.5 - 1.5 %   Methemoglobin 0.9 0.0 - 1.5 %  Glucose, capillary     Status: Abnormal   Collection Time: 01/07/15  7:26 AM  Result Value Ref Range   Glucose-Capillary 263 (H) 70 - 99 mg/dL   Comment 1 Capillary Specimen   Glucose, capillary     Status: Abnormal   Collection Time: 01/07/15 11:10 AM  Result Value Ref Range   Glucose-Capillary 225 (H) 70 - 99 mg/dL   Comment 1 Capillary Specimen     Imaging: Portable Chest Xray  01/06/2015   CLINICAL DATA:  Acute respiratory failure  EXAM: PORTABLE CHEST - 1 VIEW  COMPARISON:  January 05, 2015  FINDINGS: Central catheter tip is in the superior vena cava. There is a pacemaker present with leads attached to the right ventricle. No pneumothorax. There is stable cardiomegaly with mild pulmonary venous hypertension. There is airspace consolidation the left base. There are small layering effusions. No new opacity.   IMPRESSION: Findings consistent with a degree of congestive heart failure. No new opacity. No pneumothorax.   Electronically Signed   By: Lowella Grip III M.D.   On: 01/06/2015 07:51   Portable Chest Xray  01/05/2015   CLINICAL DATA:  Acute respiratory failure  EXAM: PORTABLE CHEST - 1 VIEW  COMPARISON:  January 02, 2015  FINDINGS: There are pleural effusions bilaterally with cardiomegaly and mild pulmonary venous hypertension. There is mild airspace consolidation in the left base, stable. There is no new opacity. Central catheter tip is in the superior vena cava. Pacemaker lead is attached to the right ventricle. No pneumothorax.  IMPRESSION: Evidence of a degree of congestive heart failure. Question atelectasis or pneumonia superimposed in the left base. No new opacity compared to recent prior study.   Electronically Signed   By: Lowella Grip III M.D.   On: 01/05/2015 18:10    Assessment:  1. Principal Problem: 2.   Acute on chronic systolic and diastolic heart failure, NYHA class 4 3. Active Problems: 4.   Atrial flutter with rapid ventricular response 5.   DM type 2, uncontrolled, with renal complications 6.   Hyperglycemia 7.   CKD (chronic kidney disease), stage III 8.   Obesity (BMI 30-39.9) 9.   Essential hypertension 10.   Acute liver failure 11.   Abdominal pain 12.   Acute pulmonary edema 13.   Hypoxemia 14.   Plan:  1.  Acute on chronic systolic congestive heart failure- EF 15% - Continue milrinone at current dose and po torsemide, given adequate diuresis. May be able to wean down milrinone tomorrow.  Replete potassium today. 2. Atrial flutter - s/p ablation. Maintaining sinus tach. Will not add back b-blocker until weaned down off milrinone. On digoxin. 3. CKDIII - renal function is improving. Good urine output on po torsemide. 4. Elevated liver enzymes - likely due to hepatic congestion from CHF - this is improving with diuresis and milrinone.  Time Spent Directly  with Patient:  15 minutes  Length of Stay:  LOS: 7 days   Pixie Casino, MD, [facility] Attending Cardiologist [facility]  HILTY,Kenneth C 01/07/2015, 12:41 PM

## 2015-01-07 NOTE — Progress Notes (Signed)
Patient ID: Daniel Blankenship, male   DOB: [birth year], 59 y.o.   MRN: [id number]   SUBJECTIVE:   Underwent successful atrial flutter ablation on 3/25.  He was lethargic post-extubation so was re-intubated.  He then woke up and self-extubated.  Amiodarone stopped. Maintaining NSR.   IV lasix stopped yesterday. Placed on po torsemide. Weight down 6 pounds. Co-ox 79%. CVP 11. Feels good. No dyspnea.    Scheduled Meds: . apixaban  5 mg Oral BID  . atorvastatin  80 mg Oral q1800  . digoxin  0.125 mg Oral Daily  . docusate sodium  200 mg Oral Daily  . insulin aspart  0-20 Units Subcutaneous TID WC  . insulin aspart  0-5 Units Subcutaneous QHS  . insulin glargine  40 Units Subcutaneous QHS  . off the beat book   Does not apply Once  . pantoprazole (PROTONIX) IV  40 mg Intravenous Daily  . sodium chloride  10-40 mL Intracatheter Q12H  . sodium chloride  3 mL Intravenous Q12H  . spironolactone  12.5 mg Oral Daily  . torsemide  40 mg Oral BID   Continuous Infusions: . milrinone 0.375 mcg/kg/min (01/07/15 0800)   PRN Meds:.sodium chloride, acetaminophen, ALPRAZolam, ondansetron (ZOFRAN) IV, oxyCODONE-acetaminophen, promethazine, sodium chloride, sodium chloride    Filed Vitals:   01/07/15 0000 01/07/15 0500 01/07/15 0724 01/07/15 1110  BP: 101/67 96/51 88/48  105/65  Pulse: 99 105 102 98  Temp: 97.6 F (36.4 C) 98.2 F (36.8 C) 97.5 F (36.4 C) 97.8 F (36.6 C)  TempSrc: Oral Oral Oral Oral  Resp: 12 18 18 19   Height:      Weight:  131.6 kg (290 lb 2 oz)    SpO2: 100% 100% 99% 99%    Intake/Output Summary (Last 24 hours) at 01/07/15 1242 Last data filed at 01/07/15 1112  Gross per 24 hour  Intake   1550 ml  Output   2350 ml  Net   -800 ml    LABS: Basic Metabolic Panel:  Recent Labs  56/43/32 0245 01/07/15 0505  NA 136 135  K 3.6 3.1*  CL 102 97  CO2 28 31  GLUCOSE 200* 212*  BUN 25* 23  CREATININE 2.15* 1.98*  CALCIUM 8.2* 7.9*   Liver Function  Tests:  Recent Labs  01/06/15 0245 01/07/15 0505  AST 85* 54*  ALT 174* 124*  ALKPHOS 156* 124*  BILITOT 2.4* 2.5*  PROT 5.3* 5.2*  ALBUMIN 2.7* 2.5*   No results for input(s): LIPASE, AMYLASE in the last 72 hours. CBC:  Recent Labs  01/06/15 0245 01/07/15 0505  WBC 12.0* 7.9  HGB 11.6* 10.9*  HCT 36.2* 33.8*  MCV 84.4 83.7  PLT 195 170   Cardiac Enzymes: No results for input(s): CKTOTAL, CKMB, CKMBINDEX, TROPONINI in the last 72 hours. BNP: Invalid input(s): POCBNP D-Dimer: No results for input(s): DDIMER in the last 72 hours. Hemoglobin A1C: No results for input(s): HGBA1C in the last 72 hours. Fasting Lipid Panel: No results for input(s): CHOL, HDL, LDLCALC, TRIG, CHOLHDL, LDLDIRECT in the last 72 hours. Thyroid Function Tests: No results for input(s): TSH, T4TOTAL, T3FREE, THYROIDAB in the last 72 hours.  Invalid input(s): FREET3 Anemia Panel:  Recent Labs  01/05/15 0430  FERRITIN 377*    RADIOLOGY: Ct Abdomen Pelvis Wo Contrast  01/03/2015   CLINICAL DATA:  Abdominal pain, elevated LFTs and liver failure. Nausea.  EXAM: CT ABDOMEN AND PELVIS WITHOUT CONTRAST  TECHNIQUE: Multidetector CT imaging of the abdomen and pelvis was  performed following the standard protocol without IV contrast.  COMPARISON:  Abdominal ultrasound 01/01/2015  FINDINGS: Small right pleural effusion. The heart is enlarged. There is bibasilar atelectasis in the lower lobes.  Liver is prominent in size with decreased density, suspect steatosis. There is decreased density adjacent to the falciform ligament that may reflect more focal steatosis. The questioned lesion on ultrasound is not well seen, detailed evaluation limited given lack of intravenous contrast. The gallbladder is physiologically distended. Probable sludge in the gallbladder lumen. No pericholecystic fluid. Common bile duct is not well visualized.  Spleen is normal in size. Pancreas and adrenal glands are normal. There is no  hydronephrosis or perinephric stranding. No renal stones. Suspect 1.3 cm cyst in the lower right kidney.  Stomach is decompressed. There are no dilated or thickened bowel loops. The appendix is not definitively identified. Small volume of stool throughout the colon. There is oral contrast throughout the colon.  The abdominal aorta is normal in caliber, densely calcified. No retroperitoneal adenopathy. No free air, free fluid, or intra-abdominal fluid collection. No ascites. There is a small to moderate fat containing umbilical hernia.  Within the pelvis the bladder is minimally distended. Prostate gland is normal in size. No pelvic free fluid or ascites. No pelvic adenopathy.  There is degenerative change in the lower lumbar spine. No acute osseous abnormality or focal osseous lesion.  IMPRESSION: 1. Decreased hepatic density, suspect hepatic steatosis. There is more focal fatty infiltration adjacent to the falciform ligament of the liver. The lesion on ultrasound is not seen without IV contrast, characterization recommended with MRI. 2. Probable sludge in the gallbladder. 3. Small right pleural effusion, new from prior exam. 4. Fat containing umbilical hernia.   Electronically Signed   By: Rubye Oaks M.D.   On: 01/03/2015 05:40   Ct Angio Chest Pe W/cm &/or Wo Cm  12/31/2014   CLINICAL DATA:  Shortness of breath. Abdominal pain. Productive cough.  EXAM: CT ANGIOGRAPHY CHEST WITH CONTRAST  TECHNIQUE: Multidetector CT imaging of the chest was performed using the standard protocol during bolus administration of intravenous contrast. Multiplanar CT image reconstructions and MIPs were obtained to evaluate the vascular anatomy.  CONTRAST:  OMNIPAQUE IOHEXOL 350 MG/ML SOLN  COMPARISON:  Chest x-ray dated 12/31/2014  FINDINGS: There are no pulmonary emboli or infiltrates or effusions. There is a 5 mm peripheral nodule in the right lower lobe on image 58 of series 6. The patient has mediastinal and hilar  adenopathy. There is a 13 mm node just to the left of the aortic arch on image 29, and azygos node measuring 13 mm on image 33, and a prominent nodal mass measuring 6.6 x 2.7 x 4.8 cm in the subcarinal region. Part of the soft tissue density represents the esophagus but I think of the scar this is adenopathy.  There is cardiomegaly with dilatation of the left ventricle.  No osseous abnormality. The visualized portion of the upper abdomen is normal.  Review of the MIP images confirms the above findings.  IMPRESSION: 1. No pulmonary emboli. 2. Cardiomegaly with dilatation of the right ventricle. 3. Mediastinal and hilar adenopathy of unknown etiology. The possibility of malignancy should be considered. 4. 5 mm nodule in the periphery of the right lower lobe. This should be followed up in 6 months with chest CT without contrast if the adenopathy is not determined to be malignant.   Electronically Signed   By: Francene Boyers M.D.   On: 12/31/2014 17:33  Portable Chest Xray  01/06/2015   CLINICAL DATA:  Acute respiratory failure  EXAM: PORTABLE CHEST - 1 VIEW  COMPARISON:  January 05, 2015  FINDINGS: Central catheter tip is in the superior vena cava. There is a pacemaker present with leads attached to the right ventricle. No pneumothorax. There is stable cardiomegaly with mild pulmonary venous hypertension. There is airspace consolidation the left base. There are small layering effusions. No new opacity.  IMPRESSION: Findings consistent with a degree of congestive heart failure. No new opacity. No pneumothorax.   Electronically Signed   By: Bretta Bang III M.D.   On: 01/06/2015 07:51   Portable Chest Xray  01/05/2015   CLINICAL DATA:  Acute respiratory failure  EXAM: PORTABLE CHEST - 1 VIEW  COMPARISON:  January 02, 2015  FINDINGS: There are pleural effusions bilaterally with cardiomegaly and mild pulmonary venous hypertension. There is mild airspace consolidation in the left base, stable. There is no new  opacity. Central catheter tip is in the superior vena cava. Pacemaker lead is attached to the right ventricle. No pneumothorax.  IMPRESSION: Evidence of a degree of congestive heart failure. Question atelectasis or pneumonia superimposed in the left base. No new opacity compared to recent prior study.   Electronically Signed   By: Bretta Bang III M.D.   On: 01/05/2015 18:10   Dg Chest Port 1 View  01/02/2015   CLINICAL DATA:  Left central line insertion  EXAM: PORTABLE CHEST - 1 VIEW  COMPARISON:  12/31/2014  FINDINGS: New left IJ catheter, tip at the SVC level. No interval displacement of the right sided pacer/ICD.  Increased hazy density of the lower chest is likely from soft tissue attenuation. There is ongoing pulmonary venous congestion. No pneumothorax.  IMPRESSION: 1. New left IJ catheter is in good position.  No pneumothorax. 2. Unchanged cardiomegaly and pulmonary venous congestion.   Electronically Signed   By: Marnee Spring M.D.   On: 01/02/2015 19:54   Dg Chest Port 1 View  12/31/2014   CLINICAL DATA:  Shortness of breath and chest pain  EXAM: PORTABLE CHEST - 1 VIEW  COMPARISON:  None.  FINDINGS: Cardiac shadow is enlarged. A defibrillator is noted. No focal infiltrate or sizable effusion is seen.  IMPRESSION: No active disease.   Electronically Signed   By: Alcide Clever M.D.   On: 12/31/2014 14:14   US Abdomen Limited Ruq  01/01/2015   CLINICAL DATA:  Abnormal LFTs  EXAM: US ABDOMEN LIMITED - RIGHT UPPER QUADRANT  COMPARISON:  None.  FINDINGS: Gallbladder:  There is intraluminal sludge. Gallbladder wall thickness is normal, 2.0 mm. The patient was not tender over the gallbladder.  Common bile duct:  Diameter: 5.2 mm, normal  Liver:  The liver is enlarged, over 20 cm craniocaudal. It is diffusely increased in echogenicity consistent with fatty infiltration. There is a 2.5 cm focal hyperechoic homogeneous circumscribed lesion which likely represents a benign cavernous hemangioma.   IMPRESSION: 1. Enlarged liver with abnormally increased echogenicity. Fatty infiltration or other liver disease can produce this appearance. 2. 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 MRI with contrast would conclusively characterize this abnormality.   Electronically Signed   By: Ellery Plunk M.D.   On: 01/01/2015 06:05    PHYSICAL EXAM General: NAD Neck: JVP 11 cm, no thyromegaly or thyroid nodule.  Lungs: Slight crackles at bases.  CV: Lateral PMI.  Heart regular S1/S2, +S3 but  tachy, no murmur.  Tr- 1+ ankle edema.   Abdomen: Soft, NT  no hepatosplenomegaly, no distention.  Neurologic: Alert and oriented x 3.  Psych: Normal affect. Extremities: No clubbing or cyanosis.   TELEMETRY: Reviewed telemetry pt in NSR/sinus tach  ASSESSMENT AND PLAN: 59 yo with history of CAD s/p multivessel PCI, ischemic cardiomyopathy with systolic CHF, CKD, and atrial flutter presented with atrial flutter/RVR and acute on chronic systolic CHF.   1. Acute on chronic systolic CHF: Ischemic cardiomyopathy, EF 15% by echo this admission.  He was started on milrinone due to concern for low output and sent to CCU, line was placed.  -  Diuresing well on po torsemide. Creatinine improved. Cut milrinone to 0.25 -  Hydralazine/Imdur on hold with soft BP.  - Continue spironolactone.  - Per Dr. Shirlee LatchMclean. Will need formal RHC after diuresis prior to discharge.  As he will be anticoagulated, would try to do this from right brachial IV.  - [facility] and deemed to be a poor candidate (apparently compliance issues and lives alone).  He has children but they do not live with him, not sure he would have much help at home.  We will need to consider his candidacy for LVAD/transplant going forward but doubt he will be candidate. .   2. Atrial flutter:  - S/p AFL ablation 3/25. In NSR. Off   amio - Continue Eliquis 5 mg bid.   3. AKI on CKD: . Improving 4. CAD: h/o PCI.  Slight troponin elevation, suspect demand ischemia from CHF.    Arvilla Meresaniel Liat Mayol MD 01/07/2015 12:42 PM

## 2015-01-08 ENCOUNTER — Encounter (HOSPITAL_COMMUNITY): Payer: Self-pay | Admitting: Cardiology

## 2015-01-08 LAB — BASIC METABOLIC PANEL
ANION GAP: 8 (ref 5–15)
BUN: 19 mg/dL (ref 6–23)
CALCIUM: 8.3 mg/dL — AB (ref 8.4–10.5)
CHLORIDE: 98 mmol/L (ref 96–112)
CO2: 32 mmol/L (ref 19–32)
Creatinine, Ser: 1.4 mg/dL — ABNORMAL HIGH (ref 0.50–1.35)
GFR calc non Af Amer: 54 mL/min — ABNORMAL LOW (ref >90)
GFR, EST AFRICAN AMERICAN: 62 mL/min — AB (ref >90)
Glucose, Bld: 196 mg/dL — ABNORMAL HIGH (ref 70–99)
POTASSIUM: 3.5 mmol/L (ref 3.5–5.1)
Sodium: 138 mmol/L (ref 135–145)

## 2015-01-08 LAB — GLUCOSE, CAPILLARY
GLUCOSE-CAPILLARY: 198 mg/dL — AB (ref 70–99)
GLUCOSE-CAPILLARY: 275 mg/dL — AB (ref 70–99)
Glucose-Capillary: 188 mg/dL — ABNORMAL HIGH (ref 70–99)
Glucose-Capillary: 192 mg/dL — ABNORMAL HIGH (ref 70–99)

## 2015-01-08 LAB — CBC
HEMATOCRIT: 34 % — AB (ref 39.0–52.0)
HEMOGLOBIN: 10.7 g/dL — AB (ref 13.0–17.0)
MCH: 26.5 pg (ref 26.0–34.0)
MCHC: 31.5 g/dL (ref 30.0–36.0)
MCV: 84.2 fL (ref 78.0–100.0)
PLATELETS: 166 10*3/uL (ref 150–400)
RBC: 4.04 MIL/uL — ABNORMAL LOW (ref 4.22–5.81)
RDW: 15.3 % (ref 11.5–15.5)
WBC: 7.9 10*3/uL (ref 4.0–10.5)

## 2015-01-08 LAB — CARBOXYHEMOGLOBIN
CARBOXYHEMOGLOBIN: 2.1 % — AB (ref 0.5–1.5)
Carboxyhemoglobin: 1.9 % — ABNORMAL HIGH (ref 0.5–1.5)
METHEMOGLOBIN: 0.8 % (ref 0.0–1.5)
Methemoglobin: 0.9 % (ref 0.0–1.5)
O2 SAT: 57 %
O2 SAT: 50.9 %
TOTAL HEMOGLOBIN: 10.9 g/dL — AB (ref 13.5–18.0)
Total hemoglobin: 11.7 g/dL — ABNORMAL LOW (ref 13.5–18.0)

## 2015-01-08 MED ORDER — PANTOPRAZOLE SODIUM 40 MG PO TBEC
40.0000 mg | DELAYED_RELEASE_TABLET | Freq: Every day | ORAL | Status: DC
  Administered 2015-01-08 – 2015-01-12 (×5): 40 mg via ORAL
  Filled 2015-01-08 (×6): qty 1

## 2015-01-08 MED ORDER — ISOSORBIDE MONONITRATE 15 MG HALF TABLET
15.0000 mg | ORAL_TABLET | Freq: Every day | ORAL | Status: DC
  Administered 2015-01-08: 15 mg via ORAL
  Filled 2015-01-08 (×2): qty 1

## 2015-01-08 MED ORDER — HYDRALAZINE HCL 25 MG PO TABS
12.5000 mg | ORAL_TABLET | Freq: Three times a day (TID) | ORAL | Status: DC
Start: 2015-01-08 — End: 2015-01-13
  Administered 2015-01-08 – 2015-01-12 (×13): 12.5 mg via ORAL
  Filled 2015-01-08 (×16): qty 0.5

## 2015-01-08 MED ORDER — TORSEMIDE 20 MG PO TABS
60.0000 mg | ORAL_TABLET | Freq: Two times a day (BID) | ORAL | Status: DC
  Filled 2015-01-08 (×2): qty 3

## 2015-01-08 MED ORDER — FUROSEMIDE 10 MG/ML IJ SOLN
80.0000 mg | Freq: Once | INTRAMUSCULAR | Status: AC
  Administered 2015-01-08: 80 mg via INTRAVENOUS
  Filled 2015-01-08: qty 8

## 2015-01-08 MED ORDER — POTASSIUM CHLORIDE CRYS ER 20 MEQ PO TBCR
40.0000 meq | EXTENDED_RELEASE_TABLET | ORAL | Status: AC
  Administered 2015-01-08 (×2): 40 meq via ORAL
  Filled 2015-01-08 (×2): qty 2

## 2015-01-08 MED ORDER — TORSEMIDE 20 MG PO TABS
40.0000 mg | ORAL_TABLET | Freq: Two times a day (BID) | ORAL | Status: DC
  Administered 2015-01-08: 40 mg via ORAL
  Filled 2015-01-08 (×4): qty 2

## 2015-01-08 NOTE — Progress Notes (Signed)
CARDIAC REHAB PHASE I   PRE:  Rate/Rhythm: 117 ST PVCs  BP:  Supine: 127/76  Sitting:   Standing:    SaO2: 97%RA  MODE:  Ambulation: 100 ft   POST:  Rate/Rhythm: 134 ST PVCs  BP:  Supine:   Sitting: 118/86  Standing:    SaO2: 95% 2L 1335-1402  Came this morning to walk and pt asked us to return after lunch. Pt walked 100 ft on 2L with rolling walker and asst x 2 stopping twice to rest leaning over walker. Followed with chair but pt did not need to sit to rest. C/o dizziness during walk. Requested to go back to bed after walk. Fatigued. Will try to time walks in afternoon as pt stated does not like getting started early in morning.   Luetta Nuttingharlene Jaymee Tilson, RN BSN  01/08/2015 1:59 PM

## 2015-01-08 NOTE — Progress Notes (Addendum)
Patient ID: Daniel Blankenship, male   DOB: 1956-07-03, 60 y.o.   MRN: [id number]   SUBJECTIVE:   Underwent successful atrial flutter ablation on 3/25.  He was lethargic post-extubation so was re-intubated.  He then woke up and self-extubated.  Amiodarone stopped. Maintaining NSR.   Yesterday milrinone was cut back to 0.25 mcg. Weight down 4 pounds. But CVP up.  CO-OX 57%  Denies SOB.      Scheduled Meds: . apixaban  5 mg Oral BID  . atorvastatin  80 mg Oral q1800  . digoxin  0.125 mg Oral Daily  . docusate sodium  200 mg Oral Daily  . insulin aspart  0-20 Units Subcutaneous TID WC  . insulin aspart  0-5 Units Subcutaneous QHS  . insulin glargine  40 Units Subcutaneous QHS  . off the beat book   Does not apply Once  . pantoprazole (PROTONIX) IV  40 mg Intravenous Daily  . sodium chloride  10-40 mL Intracatheter Q12H  . sodium chloride  3 mL Intravenous Q12H  . spironolactone  12.5 mg Oral Daily  . torsemide  40 mg Oral BID   Continuous Infusions: . milrinone 0.25 mcg/kg/min (01/07/15 2227)   PRN Meds:.sodium chloride, acetaminophen, ALPRAZolam, ondansetron (ZOFRAN) IV, oxyCODONE-acetaminophen, promethazine, sodium chloride, sodium chloride    Filed Vitals:   01/08/15 0100 01/08/15 0400 01/08/15 0426 01/08/15 0722  BP:    112/79  Pulse: 60     Temp:  98 F (36.7 C)  97.7 F (36.5 C)  TempSrc:  Oral  Oral  Resp: 26   20  Height:      Weight:   286 lb 2.5 oz (129.8 kg)   SpO2: 98%   98%    Intake/Output Summary (Last 24 hours) at 01/08/15 0752 Last data filed at 01/08/15 0600  Gross per 24 hour  Intake   1311 ml  Output   3175 ml  Net  -1864 ml    LABS: Basic Metabolic Panel:  Recent Labs  01/06/15 0245 01/07/15 0505  NA 136 135  K 3.6 3.1*  CL 102 97  CO2 28 31  GLUCOSE 200* 212*  BUN 25* 23  CREATININE 2.15* 1.98*  CALCIUM 8.2* 7.9*   Liver Function Tests:  Recent Labs  01/06/15 0245 01/07/15 0505  AST 85* 54*  ALT 174* 124*  ALKPHOS 156*  124*  BILITOT 2.4* 2.5*  PROT 5.3* 5.2*  ALBUMIN 2.7* 2.5*   No results for input(s): LIPASE, AMYLASE in the last 72 hours. CBC:  Recent Labs  01/07/15 0505 01/08/15 0425  WBC 7.9 7.9  HGB 10.9* 10.7*  HCT 33.8* 34.0*  MCV 83.7 84.2  PLT 170 166   Cardiac Enzymes: No results for input(s): CKTOTAL, CKMB, CKMBINDEX, TROPONINI in the last 72 hours. BNP: Invalid input(s): POCBNP D-Dimer: No results for input(s): DDIMER in the last 72 hours. Hemoglobin A1C: No results for input(s): HGBA1C in the last 72 hours. Fasting Lipid Panel: No results for input(s): CHOL, HDL, LDLCALC, TRIG, CHOLHDL, LDLDIRECT in the last 72 hours. Thyroid Function Tests: No results for input(s): TSH, T4TOTAL, T3FREE, THYROIDAB in the last 72 hours.  Invalid input(s): FREET3 Anemia Panel: No results for input(s): VITAMINB12, FOLATE, FERRITIN, TIBC, IRON, RETICCTPCT in the last 72 hours.  RADIOLOGY: Ct Abdomen Pelvis Wo Contrast  01/03/2015   CLINICAL DATA:  Abdominal pain, elevated LFTs and liver failure. Nausea.  EXAM: CT ABDOMEN AND PELVIS WITHOUT CONTRAST  TECHNIQUE: Multidetector CT imaging of the abdomen and pelvis was  performed following the standard protocol without IV contrast.  COMPARISON:  Abdominal ultrasound 01/01/2015  FINDINGS: Small right pleural effusion. The heart is enlarged. There is bibasilar atelectasis in the lower lobes.  Liver is prominent in size with decreased density, suspect steatosis. There is decreased density adjacent to the falciform ligament that may reflect more focal steatosis. The questioned lesion on ultrasound is not well seen, detailed evaluation limited given lack of intravenous contrast. The gallbladder is physiologically distended. Probable sludge in the gallbladder lumen. No pericholecystic fluid. Common bile duct is not well visualized.  Spleen is normal in size. Pancreas and adrenal glands are normal. There is no hydronephrosis or perinephric stranding. No renal  stones. Suspect 1.3 cm cyst in the lower right kidney.  Stomach is decompressed. There are no dilated or thickened bowel loops. The appendix is not definitively identified. Small volume of stool throughout the colon. There is oral contrast throughout the colon.  The abdominal aorta is normal in caliber, densely calcified. No retroperitoneal adenopathy. No free air, free fluid, or intra-abdominal fluid collection. No ascites. There is a small to moderate fat containing umbilical hernia.  Within the pelvis the bladder is minimally distended. Prostate gland is normal in size. No pelvic free fluid or ascites. No pelvic adenopathy.  There is degenerative change in the lower lumbar spine. No acute osseous abnormality or focal osseous lesion.  IMPRESSION: 1. Decreased hepatic density, suspect hepatic steatosis. There is more focal fatty infiltration adjacent to the falciform ligament of the liver. The lesion on ultrasound is not seen without IV contrast, characterization recommended with MRI. 2. Probable sludge in the gallbladder. 3. Small right pleural effusion, new from prior exam. 4. Fat containing umbilical hernia.   Electronically Signed   By: Jeb Levering M.D.   On: 01/03/2015 05:40   Ct Angio Chest Pe W/cm &/or Wo Cm  12/31/2014   CLINICAL DATA:  Shortness of breath. Abdominal pain. Productive cough.  EXAM: CT ANGIOGRAPHY CHEST WITH CONTRAST  TECHNIQUE: Multidetector CT imaging of the chest was performed using the standard protocol during bolus administration of intravenous contrast. Multiplanar CT image reconstructions and MIPs were obtained to evaluate the vascular anatomy.  CONTRAST:  175m OMNIPAQUE IOHEXOL 350 MG/ML SOLN  COMPARISON:  Chest x-ray dated 12/31/2014  FINDINGS: There are no pulmonary emboli or infiltrates or effusions. There is a 5 mm peripheral nodule in the right lower lobe on image 58 of series 6. The patient has mediastinal and hilar adenopathy. There is a 13 mm node just to the left of  the aortic arch on image 29, and azygos node measuring 13 mm on image 33, and a prominent nodal mass measuring 6.6 x 2.7 x 4.8 cm in the subcarinal region. Part of the soft tissue density represents the esophagus but I think of the scar this is adenopathy.  There is cardiomegaly with dilatation of the left ventricle.  No osseous abnormality. The visualized portion of the upper abdomen is normal.  Review of the MIP images confirms the above findings.  IMPRESSION: 1. No pulmonary emboli. 2. Cardiomegaly with dilatation of the right ventricle. 3. Mediastinal and hilar adenopathy of unknown etiology. The possibility of malignancy should be considered. 4. 5 mm nodule in the periphery of the right lower lobe. This should be followed up in 6 months with chest CT without contrast if the adenopathy is not determined to be malignant.   Electronically Signed   By: JLorriane ShireM.D.   On: 12/31/2014 17:33  Portable Chest Xray  01/06/2015   CLINICAL DATA:  Acute respiratory failure  EXAM: PORTABLE CHEST - 1 VIEW  COMPARISON:  January 05, 2015  FINDINGS: Central catheter tip is in the superior vena cava. There is a pacemaker present with leads attached to the right ventricle. No pneumothorax. There is stable cardiomegaly with mild pulmonary venous hypertension. There is airspace consolidation the left base. There are small layering effusions. No new opacity.  IMPRESSION: Findings consistent with a degree of congestive heart failure. No new opacity. No pneumothorax.   Electronically Signed   By: Lowella Grip III M.D.   On: 01/06/2015 07:51   Portable Chest Xray  01/05/2015   CLINICAL DATA:  Acute respiratory failure  EXAM: PORTABLE CHEST - 1 VIEW  COMPARISON:  January 02, 2015  FINDINGS: There are pleural effusions bilaterally with cardiomegaly and mild pulmonary venous hypertension. There is mild airspace consolidation in the left base, stable. There is no new opacity. Central catheter tip is in the superior vena cava.  Pacemaker lead is attached to the right ventricle. No pneumothorax.  IMPRESSION: Evidence of a degree of congestive heart failure. Question atelectasis or pneumonia superimposed in the left base. No new opacity compared to recent prior study.   Electronically Signed   By: Lowella Grip III M.D.   On: 01/05/2015 18:10   Dg Chest Port 1 View  01/02/2015   CLINICAL DATA:  Left central line insertion  EXAM: PORTABLE CHEST - 1 VIEW  COMPARISON:  12/31/2014  FINDINGS: New left IJ catheter, tip at the SVC level. No interval displacement of the right sided pacer/ICD.  Increased hazy density of the lower chest is likely from soft tissue attenuation. There is ongoing pulmonary venous congestion. No pneumothorax.  IMPRESSION: 1. New left IJ catheter is in good position.  No pneumothorax. 2. Unchanged cardiomegaly and pulmonary venous congestion.   Electronically Signed   By: Monte Fantasia M.D.   On: 01/02/2015 19:54   Dg Chest Port 1 View  12/31/2014   CLINICAL DATA:  Shortness of breath and chest pain  EXAM: PORTABLE CHEST - 1 VIEW  COMPARISON:  None.  FINDINGS: Cardiac shadow is enlarged. A defibrillator is noted. No focal infiltrate or sizable effusion is seen.  IMPRESSION: No active disease.   Electronically Signed   By: Inez Catalina M.D.   On: 12/31/2014 14:14   US Abdomen Limited Ruq  01/01/2015   CLINICAL DATA:  Abnormal LFTs  EXAM: US ABDOMEN LIMITED - RIGHT UPPER QUADRANT  COMPARISON:  None.  FINDINGS: Gallbladder:  There is intraluminal sludge. Gallbladder wall thickness is normal, 2.0 mm. The patient was not tender over the gallbladder.  Common bile duct:  Diameter: 5.2 mm, normal  Liver:  The liver is enlarged, over 20 cm craniocaudal. It is diffusely increased in echogenicity consistent with fatty infiltration. There is a 2.5 cm focal hyperechoic homogeneous circumscribed lesion which likely represents a benign cavernous hemangioma.  IMPRESSION: 1. Enlarged liver with abnormally increased  echogenicity. Fatty infiltration or other liver disease can produce this appearance. 2. 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 MRI with contrast would conclusively characterize this abnormality.   Electronically Signed   By: Andreas Newport M.D.   On: 01/01/2015 06:05    PHYSICAL EXAM CVP 14-15 General: NAD Neck: JVP 14-15 cm, no thyromegaly or thyroid nodule.  Lungs: Slight crackles at bases.  CV: Lateral PMI.  Heart regular S1/S2, +  S3 but tachy, no murmur.  Tr+ ankle edema.   Abdomen: Soft, NT  no hepatosplenomegaly, no distention.  Neurologic: Alert and oriented x 3.  Psych: Normal affect. Extremities: No clubbing or cyanosis.   TELEMETRY: Reviewed telemetry pt in NSR/sinus tach  Results for orders placed or performed during the hospital encounter of 12/31/14 (from the past 48 hour(s))  Glucose, capillary     Status: Abnormal   Collection Time: 01/06/15 11:30 AM  Result Value Ref Range   Glucose-Capillary 202 (H) 70 - 99 mg/dL  Glucose, capillary     Status: Abnormal   Collection Time: 01/06/15  3:43 PM  Result Value Ref Range   Glucose-Capillary 207 (H) 70 - 99 mg/dL   Comment 1 Capillary Specimen   Glucose, capillary     Status: Abnormal   Collection Time: 01/06/15 10:32 PM  Result Value Ref Range   Glucose-Capillary 298 (H) 70 - 99 mg/dL  CBC     Status: Abnormal   Collection Time: 01/07/15  5:05 AM  Result Value Ref Range   WBC 7.9 4.0 - 10.5 K/uL   RBC 4.04 (L) 4.22 - 5.81 MIL/uL   Hemoglobin 10.9 (L) 13.0 - 17.0 g/dL   HCT 33.8 (L) 39.0 - 52.0 %   MCV 83.7 78.0 - 100.0 fL   MCH 27.0 26.0 - 34.0 pg   MCHC 32.2 30.0 - 36.0 g/dL   RDW 15.5 11.5 - 15.5 %   Platelets 170 150 - 400 K/uL  Comprehensive metabolic panel     Status: Abnormal   Collection Time: 01/07/15  5:05 AM  Result Value Ref Range   Sodium 135 135 - 145 mmol/L   Potassium 3.1 (L) 3.5 - 5.1 mmol/L   Chloride  97 96 - 112 mmol/L   CO2 31 19 - 32 mmol/L   Glucose, Bld 212 (H) 70 - 99 mg/dL   BUN 23 6 - 23 mg/dL   Creatinine, Ser 1.98 (H) 0.50 - 1.35 mg/dL   Calcium 7.9 (L) 8.4 - 10.5 mg/dL   Total Protein 5.2 (L) 6.0 - 8.3 g/dL   Albumin 2.5 (L) 3.5 - 5.2 g/dL   AST 54 (H) 0 - 37 U/L   ALT 124 (H) 0 - 53 U/L   Alkaline Phosphatase 124 (H) 39 - 117 U/L   Total Bilirubin 2.5 (H) 0.3 - 1.2 mg/dL   GFR calc non Af Amer 35 (L) >90 mL/min   GFR calc Af Amer 41 (L) >90 mL/min    Comment: (NOTE) The eGFR has been calculated using the CKD EPI equation. This calculation has not been validated in all clinical situations. eGFR's persistently <90 mL/min signify possible Chronic Kidney Disease.    Anion gap 7 5 - 15  Carboxyhemoglobin     Status: Abnormal   Collection Time: 01/07/15  6:05 AM  Result Value Ref Range   Total hemoglobin 10.9 (L) 13.5 - 18.0 g/dL   O2 Saturation 79.8 %   Carboxyhemoglobin 2.4 (H) 0.5 - 1.5 %   Methemoglobin 0.9 0.0 - 1.5 %  Glucose, capillary     Status: Abnormal   Collection Time: 01/07/15  7:26 AM  Result Value Ref Range   Glucose-Capillary 263 (H) 70 - 99 mg/dL   Comment 1 Capillary Specimen   Glucose, capillary     Status: Abnormal   Collection Time: 01/07/15 11:10 AM  Result Value Ref Range   Glucose-Capillary 225 (H) 70 - 99 mg/dL   Comment 1 Capillary Specimen  Glucose, capillary     Status: Abnormal   Collection Time: 01/07/15  4:13 PM  Result Value Ref Range   Glucose-Capillary 164 (H) 70 - 99 mg/dL   Comment 1 Capillary Specimen   Glucose, capillary     Status: Abnormal   Collection Time: 01/07/15 10:24 PM  Result Value Ref Range   Glucose-Capillary 306 (H) 70 - 99 mg/dL  CBC     Status: Abnormal   Collection Time: 01/08/15  4:25 AM  Result Value Ref Range   WBC 7.9 4.0 - 10.5 K/uL   RBC 4.04 (L) 4.22 - 5.81 MIL/uL   Hemoglobin 10.7 (L) 13.0 - 17.0 g/dL   HCT 34.0 (L) 39.0 - 52.0 %   MCV 84.2 78.0 - 100.0 fL   MCH 26.5 26.0 - 34.0 pg   MCHC  31.5 30.0 - 36.0 g/dL   RDW 15.3 11.5 - 15.5 %   Platelets 166 150 - 400 K/uL  Carboxyhemoglobin     Status: Abnormal   Collection Time: 01/08/15  5:00 AM  Result Value Ref Range   Total hemoglobin 10.9 (L) 13.5 - 18.0 g/dL   O2 Saturation 57.0 %   Carboxyhemoglobin 2.1 (H) 0.5 - 1.5 %   Methemoglobin 0.9 0.0 - 1.5 %  Glucose, capillary     Status: Abnormal   Collection Time: 01/08/15  7:22 AM  Result Value Ref Range   Glucose-Capillary 188 (H) 70 - 99 mg/dL   Comment 1 Capillary Specimen   Basic metabolic panel     Status: Abnormal   Collection Time: 01/08/15  8:27 AM  Result Value Ref Range   Sodium 138 135 - 145 mmol/L   Potassium 3.5 3.5 - 5.1 mmol/L   Chloride 98 96 - 112 mmol/L   CO2 32 19 - 32 mmol/L   Glucose, Bld 196 (H) 70 - 99 mg/dL   BUN 19 6 - 23 mg/dL   Creatinine, Ser 1.40 (H) 0.50 - 1.35 mg/dL   Calcium 8.3 (L) 8.4 - 10.5 mg/dL   GFR calc non Af Amer 54 (L) >90 mL/min   GFR calc Af Amer 62 (L) >90 mL/min    Comment: (NOTE) The eGFR has been calculated using the CKD EPI equation. This calculation has not been validated in all clinical situations. eGFR's persistently <90 mL/min signify possible Chronic Kidney Disease.    Anion gap 8 5 - 15     ASSESSMENT AND PLAN: 59 yo with history of CAD s/p multivessel PCI, ischemic cardiomyopathy with systolic CHF, CKD, and atrial flutter presented with atrial flutter/RVR and acute on chronic systolic CHF.   1. Acute on chronic systolic CHF: Ischemic cardiomyopathy, EF 15% by echo this admission.  He was started on milrinone due to concern for low output and sent to CCU, line was placed.  - Volume status up. Increase diuretics. Cut back milrinone to 0.125 mcg. On dig 0.125 mg daily  -  Hydralazine 12.5 mg tid /Imdur 15 mg daily. Hold parameters provided.  Allergic to ace.  - Continue spironolactone 12.5 mg daily  - Will need formal RHC after diuresis prior to discharge.  As he will be anticoagulated, would try to do this  from right brachial IV.  - He was evaluated for advanced therapies 2 years ago at [facility] and deemed to be a poor candidate (apparently compliance issues and lives alone).  He has children but they do not live with him, not sure he would have much help at home.  We  will need to consider his candidacy for LVAD/transplant going forward but doubt he will be candidate. .   Consult cardiac rehab 2. Atrial flutter:  - S/P AFL ablation 3/25. In NSR.  Amio was stopped.  - Continue Eliquis 5 mg bid.   3. AKI on CKD: .Renal function improving 4. CAD: h/o PCI.  Slight troponin elevation, suspect demand ischemia from CHF.    CLEGG,AMY  NP-C  01/08/2015 7:52 AM   Patient seen and examined with Darrick Grinder, NP. We discussed all aspects of the encounter. I agree with the assessment and plan as stated above.   More SOB this AM. CVP up to 14-15 in setting of increased po intake. But weight actually down 4 pounds. Will give one dose IV lasix today. Continue torsemide 40 bid.   Agree with adding hydarl/imdur. Cut milrinone to 0.125. Follow co-ox closely. Remains to be seen if he will need home inotropes.   Interesting that volts are so low on tele. No LVH or evidence of infiltrative disease on echo. May be his body habitus.   Agree with CR. Maintaining NSR. Continue Eliquis.   Holten Spano,MD 9:08 AM

## 2015-01-08 NOTE — Anesthesia Postprocedure Evaluation (Signed)
  Anesthesia Post-op Note  Patient: Daniel Blankenship  Procedure(s) Performed: Procedure(s): ATRIAL FLUTTER ABLATION (N/A)  Patient Location: PACU  Anesthesia Type:General  Level of Consciousness: sedated and Patient remains intubated per anesthesia plan  Airway and Oxygen Therapy: Patient remains intubated per anesthesia plan and Patient placed on Ventilator (see vital sign flow sheet for setting)  Post-op Pain: none  Post-op Assessment: Post-op Vital signs reviewed, Patient's Cardiovascular Status Stable, RESPIRATORY FUNCTION UNSTABLE, Patent Airway, No signs of Nausea or vomiting and Pain level controlled  Post-op Vital Signs: stable  Last Vitals:  Filed Vitals:   01/08/15 0400  BP:   Pulse:   Temp: 36.7 C  Resp:     Complications: No apparent anesthesia complications

## 2015-01-08 NOTE — Progress Notes (Signed)
[facility] PROGRESS NOTE Subjective patient doing well. He had ablation yesterday for atrial flutter and appears to be doing well. Lower abdominal pain that he had appears to have resolved completely. He states that he is eating, having bowel movements and passing gas without any pain. His latest set of liver tests showed significant improvement.  Objective: Vital signs in last 24 hours: Temp:  [97.7 F (36.5 C)-98.4 F (36.9 C)] 97.7 F (36.5 C) (03/28 0722) Pulse Rate:  [60-107] 60 (03/28 0100) Resp:  [10-26] 20 (03/28 0722) BP: (105-118)/(65-79) 112/79 mmHg (03/28 0722) SpO2:  [94 %-100 %] 98 % (03/28 0722) Weight:  [129.8 kg (286 lb 2.5 oz)] 129.8 kg (286 lb 2.5 oz) (03/28 0426) Last BM Date: 01/06/15  Intake/Output from previous day: 03/27 0701 - 03/28 0700 In: 1311 [P.O.:810; I.V.:501] Out: 3175 [Urine:3175] Intake/Output this shift:    PE:  General--patient eating breakfast in no acute distress Heart-- grossly normal  Lungs--clear  Abdomen-- soft and nontender  Lab Results:  Recent Labs  01/06/15 0245 01/07/15 0505 01/08/15 0425  WBC 12.0* 7.9 7.9  HGB 11.6* 10.9* 10.7*  HCT 36.2* 33.8* 34.0*  PLT 195 170 166   BMET  Recent Labs  01/06/15 0245 01/07/15 0505 01/08/15 0827  NA 136 135 138  K 3.6 3.1* 3.5  CL 102 97 98  CO2 28 31 32  CREATININE 2.15* 1.98* 1.40*   LFT  Recent Labs  01/06/15 0245 01/07/15 0505  PROT 5.3* 5.2*  AST 85* 54*  ALT 174* 124*  ALKPHOS 156* 124*  BILITOT 2.4* 2.5*   PT/INR No results for input(s): LABPROT, INR in the last 72 hours. PANCREAS No results for input(s): LIPASE in the last 72 hours.       Studies/Results: No results found.  Medications: I have reviewed the patient's current medications.  Assessment/Plan: 1. Generalized abdominal pain. Appears to be better for unclear reasons 2. Abnormal liver tests. Hepatitis B and C markers negative liver test improving.  We will follow at a  distance and may need further workup at some point in the future after he recovers from his cardiac ablation.   Kass Herberger JR,Netty Sullivant L 01/08/2015, 9:20 AM

## 2015-01-08 NOTE — Progress Notes (Signed)
Rounding Note:  Saw patient in regards to A1c level (12.9% on 12/31/14). [facility] Patient also reports being on metformin 500 mg daily, and Lantus 20-30 units once a day prior to admission. Discussed A1C results, explained what an A1C is, and what his level currently was. Patient reports following a DM diet and checking his glucose at regular intervals. 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 Patient reports drinking water mostly. Patient was requesting that if the insulin regimen as an inpatient here was controlling his glucose levels that he would like to have prescriptions for the medications and doses sent to [facility]. Will reassess in the am how his glucose levels are being controlled on current regimen. We discussed importance of checking CBGs and maintaining good CBG control to prevent long-term and short-term complications. Discussed impact of nutrition, exercise, stress, sickness, and medications on diabetes control. Discussed with patient on days that he feels ill to still take his medication at least the basal insulin. Patient mentioned that when he felt sick that he sometimes was not taking his medication. Patient verbalized understanding of information discussed and he states that he has no further questions at this time related to diabetes.   Thanks,  Christena DeemShannon Ellenor Wisniewski RN, MSN, [facility] Inpatient Diabetes Coordinator Team Pager [phone]

## 2015-01-09 LAB — GLUCOSE, CAPILLARY
GLUCOSE-CAPILLARY: 165 mg/dL — AB (ref 70–99)
GLUCOSE-CAPILLARY: 256 mg/dL — AB (ref 70–99)
Glucose-Capillary: 128 mg/dL — ABNORMAL HIGH (ref 70–99)
Glucose-Capillary: 231 mg/dL — ABNORMAL HIGH (ref 70–99)

## 2015-01-09 LAB — CBC
HCT: 34 % — ABNORMAL LOW (ref 39.0–52.0)
Hemoglobin: 10.8 g/dL — ABNORMAL LOW (ref 13.0–17.0)
MCH: 26.7 pg (ref 26.0–34.0)
MCHC: 31.8 g/dL (ref 30.0–36.0)
MCV: 84.2 fL (ref 78.0–100.0)
PLATELETS: 178 10*3/uL (ref 150–400)
RBC: 4.04 MIL/uL — ABNORMAL LOW (ref 4.22–5.81)
RDW: 15.1 % (ref 11.5–15.5)
WBC: 7.6 10*3/uL (ref 4.0–10.5)

## 2015-01-09 LAB — BASIC METABOLIC PANEL
ANION GAP: 4 — AB (ref 5–15)
Anion gap: 10 (ref 5–15)
BUN: 16 mg/dL (ref 6–23)
BUN: 15 mg/dL (ref 6–23)
CHLORIDE: 98 mmol/L (ref 96–112)
CO2: 35 mmol/L — AB (ref 19–32)
CO2: 30 mmol/L (ref 19–32)
Calcium: 8 mg/dL — ABNORMAL LOW (ref 8.4–10.5)
Calcium: 8.4 mg/dL (ref 8.4–10.5)
Chloride: 97 mmol/L (ref 96–112)
Creatinine, Ser: 1.35 mg/dL (ref 0.50–1.35)
Creatinine, Ser: 1.34 mg/dL (ref 0.50–1.35)
GFR calc Af Amer: 65 mL/min — ABNORMAL LOW (ref >90)
GFR calc non Af Amer: 56 mL/min — ABNORMAL LOW (ref >90)
GFR, EST AFRICAN AMERICAN: 65 mL/min — AB (ref >90)
GFR, EST NON AFRICAN AMERICAN: 56 mL/min — AB (ref >90)
Glucose, Bld: 139 mg/dL — ABNORMAL HIGH (ref 70–99)
Glucose, Bld: 215 mg/dL — ABNORMAL HIGH (ref 70–99)
POTASSIUM: 3.3 mmol/L — AB (ref 3.5–5.1)
Potassium: 3.3 mmol/L — ABNORMAL LOW (ref 3.5–5.1)
SODIUM: 137 mmol/L (ref 135–145)
Sodium: 137 mmol/L (ref 135–145)

## 2015-01-09 LAB — IRON AND TIBC
Iron: 46 ug/dL (ref 42–165)
Saturation Ratios: 14 % — ABNORMAL LOW (ref 20–55)
TIBC: 334 ug/dL (ref 215–435)
UIBC: 288 ug/dL (ref 125–400)

## 2015-01-09 LAB — FERRITIN: Ferritin: 184 ng/mL (ref 22–322)

## 2015-01-09 LAB — CARBOXYHEMOGLOBIN
Carboxyhemoglobin: 1.9 % — ABNORMAL HIGH (ref 0.5–1.5)
METHEMOGLOBIN: 0.6 % (ref 0.0–1.5)
O2 SAT: 54.9 %
Total hemoglobin: 11 g/dL — ABNORMAL LOW (ref 13.5–18.0)

## 2015-01-09 LAB — RETICULOCYTES
RBC.: 4.37 MIL/uL (ref 4.22–5.81)
RETIC COUNT ABSOLUTE: 109.3 10*3/uL (ref 19.0–186.0)
Retic Ct Pct: 2.5 % (ref 0.4–3.1)

## 2015-01-09 LAB — FOLATE: FOLATE: 16 ng/mL

## 2015-01-09 LAB — VITAMIN B12: Vitamin B-12: 1970 pg/mL — ABNORMAL HIGH (ref 211–911)

## 2015-01-09 MED ORDER — POTASSIUM CHLORIDE CRYS ER 20 MEQ PO TBCR
20.0000 meq | EXTENDED_RELEASE_TABLET | Freq: Two times a day (BID) | ORAL | Status: DC
  Filled 2015-01-09 (×2): qty 1

## 2015-01-09 MED ORDER — ISOSORBIDE MONONITRATE ER 30 MG PO TB24
30.0000 mg | ORAL_TABLET | Freq: Every day | ORAL | Status: DC
  Administered 2015-01-09 – 2015-01-12 (×4): 30 mg via ORAL
  Filled 2015-01-09 (×4): qty 1

## 2015-01-09 MED ORDER — TORSEMIDE 20 MG PO TABS
40.0000 mg | ORAL_TABLET | Freq: Two times a day (BID) | ORAL | Status: DC
  Administered 2015-01-09 (×2): 40 mg via ORAL
  Filled 2015-01-09 (×5): qty 2

## 2015-01-09 MED ORDER — POTASSIUM CHLORIDE CRYS ER 20 MEQ PO TBCR
40.0000 meq | EXTENDED_RELEASE_TABLET | Freq: Once | ORAL | Status: AC
  Administered 2015-01-09: 40 meq via ORAL

## 2015-01-09 MED ORDER — POTASSIUM CHLORIDE CRYS ER 20 MEQ PO TBCR
40.0000 meq | EXTENDED_RELEASE_TABLET | Freq: Once | ORAL | Status: AC
  Administered 2015-01-09: 40 meq via ORAL
  Filled 2015-01-09: qty 2

## 2015-01-09 MED ORDER — SPIRONOLACTONE 25 MG PO TABS
25.0000 mg | ORAL_TABLET | Freq: Every day | ORAL | Status: DC
  Administered 2015-01-09 – 2015-01-12 (×4): 25 mg via ORAL
  Filled 2015-01-09 (×4): qty 1

## 2015-01-09 MED ORDER — SODIUM CHLORIDE 0.9 % IJ SOLN
10.0000 mL | INTRAMUSCULAR | Status: DC | PRN
  Administered 2015-01-12: 10 mL
  Filled 2015-01-09: qty 40

## 2015-01-09 MED ORDER — FUROSEMIDE 10 MG/ML IJ SOLN: 80.0000 mg | Freq: Two times a day (BID) | INTRAMUSCULAR | Status: DC

## 2015-01-09 MED ORDER — SODIUM CHLORIDE 0.9 % IJ SOLN
10.0000 mL | Freq: Two times a day (BID) | INTRAMUSCULAR | Status: DC
  Administered 2015-01-09 – 2015-01-11 (×5): 10 mL

## 2015-01-09 NOTE — Progress Notes (Signed)
[id number] Offered to walk with pt but he declined due to being tired. Stated he got PICC line earlier and it wore him out. Try to convince pt to try to walk to get stronger but apologetic but stated not up to it. CHF packet, diabetic and low sodium diets given by L.Kizzie BaneHughes, RN and encouraged pt to read. Answered questions pt had re milrinone.stressed importance of adhering to low sodium foods. Luetta Nuttingharlene Jamaira Sherk RN BSN 01/09/2015 1:30 PM

## 2015-01-09 NOTE — Progress Notes (Signed)
Patient ID: Daniel Blankenship, male   DOB: Jun 16, 1956, 59 y.o.   MRN: [id number]   SUBJECTIVE:   Underwent successful atrial flutter ablation on 3/25.  He was lethargic post-extubation so was re-intubated.  He then woke up and self-extubated.  Amiodarone stopped. Maintaining NSR.   Yesterday milrinone was cut back to 0.125 mcg but CO-OX went down and milrinone was increased back to 0.25 mcg. He received a dose of IV lasix and hydralazine/imdur added. Feels much better. CO-OX now at 54%. Weight unchanged. CVP 8-9   Creatinine 1.9>1.4 > 1.35   Scheduled Meds: . apixaban  5 mg Oral BID  . atorvastatin  80 mg Oral q1800  . digoxin  0.125 mg Oral Daily  . docusate sodium  200 mg Oral Daily  . hydrALAZINE  12.5 mg Oral 3 times per day  . insulin aspart  0-20 Units Subcutaneous TID WC  . insulin aspart  0-5 Units Subcutaneous QHS  . insulin glargine  40 Units Subcutaneous QHS  . isosorbide mononitrate  15 mg Oral Daily  . off the beat book   Does not apply Once  . pantoprazole  40 mg Oral Daily  . sodium chloride  10-40 mL Intracatheter Q12H  . sodium chloride  3 mL Intravenous Q12H  . spironolactone  12.5 mg Oral Daily  . torsemide  40 mg Oral BID   Continuous Infusions: . milrinone 0.25 mcg/kg/min (01/08/15 2358)   PRN Meds:.sodium chloride, acetaminophen, ALPRAZolam, ondansetron (ZOFRAN) IV, oxyCODONE-acetaminophen, promethazine, sodium chloride, sodium chloride    Filed Vitals:   01/08/15 2011 01/09/15 0000 01/09/15 0433 01/09/15 0500  BP: 112/69 105/64 101/63   Pulse:      Temp: 97.9 F (36.6 C) 97.8 F (36.6 C) 98 F (36.7 C)   TempSrc: Oral Oral Oral   Resp:      Height:      Weight:    286 lb 1.6 oz (129.774 kg)  SpO2: 97% 96% 95%     Intake/Output Summary (Last 24 hours) at 01/09/15 0740 Last data filed at 01/09/15 0700  Gross per 24 hour  Intake 2476.66 ml  Output   1650 ml  Net 826.66 ml    LABS: Basic Metabolic Panel:  Recent Labs  01/08/15 0827  01/09/15 0509  NA 138 137  K 3.5 3.3*  CL 98 98  CO2 32 35*  GLUCOSE 196* 139*  BUN 19 16  CREATININE 1.40* 1.35  CALCIUM 8.3* 8.0*   Liver Function Tests:  Recent Labs  01/07/15 0505  AST 54*  ALT 124*  ALKPHOS 124*  BILITOT 2.5*  PROT 5.2*  ALBUMIN 2.5*   No results for input(s): LIPASE, AMYLASE in the last 72 hours. CBC:  Recent Labs  01/08/15 0425 01/09/15 0509  WBC 7.9 7.6  HGB 10.7* 10.8*  HCT 34.0* 34.0*  MCV 84.2 84.2  PLT 166 178   Cardiac Enzymes: No results for input(s): CKTOTAL, CKMB, CKMBINDEX, TROPONINI in the last 72 hours. BNP: Invalid input(s): POCBNP D-Dimer: No results for input(s): DDIMER in the last 72 hours. Hemoglobin A1C: No results for input(s): HGBA1C in the last 72 hours. Fasting Lipid Panel: No results for input(s): CHOL, HDL, LDLCALC, TRIG, CHOLHDL, LDLDIRECT in the last 72 hours. Thyroid Function Tests: No results for input(s): TSH, T4TOTAL, T3FREE, THYROIDAB in the last 72 hours.  Invalid input(s): FREET3 Anemia Panel: No results for input(s): VITAMINB12, FOLATE, FERRITIN, TIBC, IRON, RETICCTPCT in the last 72 hours.  RADIOLOGY: Ct Abdomen Pelvis Wo Contrast  01/03/2015   CLINICAL DATA:  Abdominal pain, elevated LFTs and liver failure. Nausea.  EXAM: CT ABDOMEN AND PELVIS WITHOUT CONTRAST  TECHNIQUE: Multidetector CT imaging of the abdomen and pelvis was performed following the standard protocol without IV contrast.  COMPARISON:  Abdominal ultrasound 01/01/2015  FINDINGS: Small right pleural effusion. The heart is enlarged. There is bibasilar atelectasis in the lower lobes.  Liver is prominent in size with decreased density, suspect steatosis. There is decreased density adjacent to the falciform ligament that may reflect more focal steatosis. The questioned lesion on ultrasound is not well seen, detailed evaluation limited given lack of intravenous contrast. The gallbladder is physiologically distended. Probable sludge in the  gallbladder lumen. No pericholecystic fluid. Common bile duct is not well visualized.  Spleen is normal in size. Pancreas and adrenal glands are normal. There is no hydronephrosis or perinephric stranding. No renal stones. Suspect 1.3 cm cyst in the lower right kidney.  Stomach is decompressed. There are no dilated or thickened bowel loops. The appendix is not definitively identified. Small volume of stool throughout the colon. There is oral contrast throughout the colon.  The abdominal aorta is normal in caliber, densely calcified. No retroperitoneal adenopathy. No free air, free fluid, or intra-abdominal fluid collection. No ascites. There is a small to moderate fat containing umbilical hernia.  Within the pelvis the bladder is minimally distended. Prostate gland is normal in size. No pelvic free fluid or ascites. No pelvic adenopathy.  There is degenerative change in the lower lumbar spine. No acute osseous abnormality or focal osseous lesion.  IMPRESSION: 1. Decreased hepatic density, suspect hepatic steatosis. There is more focal fatty infiltration adjacent to the falciform ligament of the liver. The lesion on ultrasound is not seen without IV contrast, characterization recommended with MRI. 2. Probable sludge in the gallbladder. 3. Small right pleural effusion, new from prior exam. 4. Fat containing umbilical hernia.   Electronically Signed   By: Jeb Levering M.D.   On: 01/03/2015 05:40   Ct Angio Chest Pe W/cm &/or Wo Cm  12/31/2014   CLINICAL DATA:  Shortness of breath. Abdominal pain. Productive cough.  EXAM: CT ANGIOGRAPHY CHEST WITH CONTRAST  TECHNIQUE: Multidetector CT imaging of the chest was performed using the standard protocol during bolus administration of intravenous contrast. Multiplanar CT image reconstructions and MIPs were obtained to evaluate the vascular anatomy.  CONTRAST:  161m OMNIPAQUE IOHEXOL 350 MG/ML SOLN  COMPARISON:  Chest x-ray dated 12/31/2014  FINDINGS: There are no  pulmonary emboli or infiltrates or effusions. There is a 5 mm peripheral nodule in the right lower lobe on image 58 of series 6. The patient has mediastinal and hilar adenopathy. There is a 13 mm node just to the left of the aortic arch on image 29, and azygos node measuring 13 mm on image 33, and a prominent nodal mass measuring 6.6 x 2.7 x 4.8 cm in the subcarinal region. Part of the soft tissue density represents the esophagus but I think of the scar this is adenopathy.  There is cardiomegaly with dilatation of the left ventricle.  No osseous abnormality. The visualized portion of the upper abdomen is normal.  Review of the MIP images confirms the above findings.  IMPRESSION: 1. No pulmonary emboli. 2. Cardiomegaly with dilatation of the right ventricle. 3. Mediastinal and hilar adenopathy of unknown etiology. The possibility of malignancy should be considered. 4. 5 mm nodule in the periphery of the right lower lobe. This should be followed up in 6  months with chest CT without contrast if the adenopathy is not determined to be malignant.   Electronically Signed   By: Lorriane Shire M.D.   On: 12/31/2014 17:33   Portable Chest Xray  01/06/2015   CLINICAL DATA:  Acute respiratory failure  EXAM: PORTABLE CHEST - 1 VIEW  COMPARISON:  January 05, 2015  FINDINGS: Central catheter tip is in the superior vena cava. There is a pacemaker present with leads attached to the right ventricle. No pneumothorax. There is stable cardiomegaly with mild pulmonary venous hypertension. There is airspace consolidation the left base. There are small layering effusions. No new opacity.  IMPRESSION: Findings consistent with a degree of congestive heart failure. No new opacity. No pneumothorax.   Electronically Signed   By: Lowella Grip III M.D.   On: 01/06/2015 07:51   Portable Chest Xray  01/05/2015   CLINICAL DATA:  Acute respiratory failure  EXAM: PORTABLE CHEST - 1 VIEW  COMPARISON:  January 02, 2015  FINDINGS: There are  pleural effusions bilaterally with cardiomegaly and mild pulmonary venous hypertension. There is mild airspace consolidation in the left base, stable. There is no new opacity. Central catheter tip is in the superior vena cava. Pacemaker lead is attached to the right ventricle. No pneumothorax.  IMPRESSION: Evidence of a degree of congestive heart failure. Question atelectasis or pneumonia superimposed in the left base. No new opacity compared to recent prior study.   Electronically Signed   By: Lowella Grip III M.D.   On: 01/05/2015 18:10   Dg Chest Port 1 View  01/02/2015   CLINICAL DATA:  Left central line insertion  EXAM: PORTABLE CHEST - 1 VIEW  COMPARISON:  12/31/2014  FINDINGS: New left IJ catheter, tip at the SVC level. No interval displacement of the right sided pacer/ICD.  Increased hazy density of the lower chest is likely from soft tissue attenuation. There is ongoing pulmonary venous congestion. No pneumothorax.  IMPRESSION: 1. New left IJ catheter is in good position.  No pneumothorax. 2. Unchanged cardiomegaly and pulmonary venous congestion.   Electronically Signed   By: Monte Fantasia M.D.   On: 01/02/2015 19:54   Dg Chest Port 1 View  12/31/2014   CLINICAL DATA:  Shortness of breath and chest pain  EXAM: PORTABLE CHEST - 1 VIEW  COMPARISON:  None.  FINDINGS: Cardiac shadow is enlarged. A defibrillator is noted. No focal infiltrate or sizable effusion is seen.  IMPRESSION: No active disease.   Electronically Signed   By: Inez Catalina M.D.   On: 12/31/2014 14:14   US Abdomen Limited Ruq  01/01/2015   CLINICAL DATA:  Abnormal LFTs  EXAM: US ABDOMEN LIMITED - RIGHT UPPER QUADRANT  COMPARISON:  None.  FINDINGS: Gallbladder:  There is intraluminal sludge. Gallbladder wall thickness is normal, 2.0 mm. The patient was not tender over the gallbladder.  Common bile duct:  Diameter: 5.2 mm, normal  Liver:  The liver is enlarged, over 20 cm craniocaudal. It is diffusely increased in echogenicity  consistent with fatty infiltration. There is a 2.5 cm focal hyperechoic homogeneous circumscribed lesion which likely represents a benign cavernous hemangioma.  IMPRESSION: 1. Enlarged liver with abnormally increased echogenicity. Fatty infiltration or other liver disease can produce this appearance. 2. 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 MRI with contrast would conclusively characterize this abnormality.   Electronically Signed   By: Andreas Newport M.D.   On: 01/01/2015  06:05    PHYSICAL EXAM CVP 8-9 General: NAD Neck: JVP 8-9 LIJ TLC, no thyromegaly or thyroid nodule.  Lungs: Slight crackles at bases.  CV: Lateral PMI.  Heart regular S1/S2, +S3 but tachy, no murmur.  Tr+ ankle edema.   Abdomen: Soft, NT  no hepatosplenomegaly, no distention.  Neurologic: Alert and oriented x 3.  Psych: Normal affect. Extremities: No clubbing or cyanosis.   TELEMETRY: NSR/sinus tach  Results for orders placed or performed during the hospital encounter of 12/31/14 (from the past 48 hour(s))  Glucose, capillary     Status: Abnormal   Collection Time: 01/07/15 11:10 AM  Result Value Ref Range   Glucose-Capillary 225 (H) 70 - 99 mg/dL   Comment 1 Capillary Specimen   Glucose, capillary     Status: Abnormal   Collection Time: 01/07/15  4:13 PM  Result Value Ref Range   Glucose-Capillary 164 (H) 70 - 99 mg/dL   Comment 1 Capillary Specimen   Glucose, capillary     Status: Abnormal   Collection Time: 01/07/15 10:24 PM  Result Value Ref Range   Glucose-Capillary 306 (H) 70 - 99 mg/dL  CBC     Status: Abnormal   Collection Time: 01/08/15  4:25 AM  Result Value Ref Range   WBC 7.9 4.0 - 10.5 K/uL   RBC 4.04 (L) 4.22 - 5.81 MIL/uL   Hemoglobin 10.7 (L) 13.0 - 17.0 g/dL   HCT 34.0 (L) 39.0 - 52.0 %   MCV 84.2 78.0 - 100.0 fL   MCH 26.5 26.0 - 34.0 pg   MCHC 31.5 30.0 - 36.0 g/dL   RDW 15.3 11.5 - 15.5 %    Platelets 166 150 - 400 K/uL  Carboxyhemoglobin     Status: Abnormal   Collection Time: 01/08/15  5:00 AM  Result Value Ref Range   Total hemoglobin 10.9 (L) 13.5 - 18.0 g/dL   O2 Saturation 57.0 %   Carboxyhemoglobin 2.1 (H) 0.5 - 1.5 %   Methemoglobin 0.9 0.0 - 1.5 %  Glucose, capillary     Status: Abnormal   Collection Time: 01/08/15  7:22 AM  Result Value Ref Range   Glucose-Capillary 188 (H) 70 - 99 mg/dL   Comment 1 Capillary Specimen   Basic metabolic panel     Status: Abnormal   Collection Time: 01/08/15  8:27 AM  Result Value Ref Range   Sodium 138 135 - 145 mmol/L   Potassium 3.5 3.5 - 5.1 mmol/L   Chloride 98 96 - 112 mmol/L   CO2 32 19 - 32 mmol/L   Glucose, Bld 196 (H) 70 - 99 mg/dL   BUN 19 6 - 23 mg/dL   Creatinine, Ser 1.40 (H) 0.50 - 1.35 mg/dL   Calcium 8.3 (L) 8.4 - 10.5 mg/dL   GFR calc non Af Amer 54 (L) >90 mL/min   GFR calc Af Amer 62 (L) >90 mL/min    Comment: (NOTE) The eGFR has been calculated using the CKD EPI equation. This calculation has not been validated in all clinical situations. eGFR's persistently <90 mL/min signify possible Chronic Kidney Disease.    Anion gap 8 5 - 15  Glucose, capillary     Status: Abnormal   Collection Time: 01/08/15 11:55 AM  Result Value Ref Range   Glucose-Capillary 198 (H) 70 - 99 mg/dL   Comment 1 Capillary Specimen   Carboxyhemoglobin     Status: Abnormal   Collection Time: 01/08/15 12:00 PM  Result Value Ref Range  Total hemoglobin 11.7 (L) 13.5 - 18.0 g/dL   O2 Saturation 50.9 %   Carboxyhemoglobin 1.9 (H) 0.5 - 1.5 %   Methemoglobin 0.8 0.0 - 1.5 %  Glucose, capillary     Status: Abnormal   Collection Time: 01/08/15  4:31 PM  Result Value Ref Range   Glucose-Capillary 275 (H) 70 - 99 mg/dL   Comment 1 Capillary Specimen   Glucose, capillary     Status: Abnormal   Collection Time: 01/08/15 10:16 PM  Result Value Ref Range   Glucose-Capillary 192 (H) 70 - 99 mg/dL   Comment 1 Capillary Specimen     Carboxyhemoglobin     Status: None (Preliminary result)   Collection Time: 01/09/15  4:57 AM  Result Value Ref Range   Total hemoglobin PENDING 13.5 - 18.0 g/dL   O2 Saturation 54.9 %   Carboxyhemoglobin PENDING 0.5 - 1.5 %   Methemoglobin PENDING 0.0 - 1.5 %   Total oxygen content PENDING 15.0 - 23.0 mL/dL  CBC     Status: Abnormal   Collection Time: 01/09/15  5:09 AM  Result Value Ref Range   WBC 7.6 4.0 - 10.5 K/uL   RBC 4.04 (L) 4.22 - 5.81 MIL/uL   Hemoglobin 10.8 (L) 13.0 - 17.0 g/dL   HCT 34.0 (L) 39.0 - 52.0 %   MCV 84.2 78.0 - 100.0 fL   MCH 26.7 26.0 - 34.0 pg   MCHC 31.8 30.0 - 36.0 g/dL   RDW 15.1 11.5 - 15.5 %   Platelets 178 150 - 400 K/uL  Basic metabolic panel     Status: Abnormal   Collection Time: 01/09/15  5:09 AM  Result Value Ref Range   Sodium 137 135 - 145 mmol/L   Potassium 3.3 (L) 3.5 - 5.1 mmol/L   Chloride 98 96 - 112 mmol/L   CO2 35 (H) 19 - 32 mmol/L   Glucose, Bld 139 (H) 70 - 99 mg/dL   BUN 16 6 - 23 mg/dL   Creatinine, Ser 1.35 0.50 - 1.35 mg/dL   Calcium 8.0 (L) 8.4 - 10.5 mg/dL   GFR calc non Af Amer 56 (L) >90 mL/min   GFR calc Af Amer 65 (L) >90 mL/min    Comment: (NOTE) The eGFR has been calculated using the CKD EPI equation. This calculation has not been validated in all clinical situations. eGFR's persistently <90 mL/min signify possible Chronic Kidney Disease.    Anion gap 4 (L) 5 - 15     ASSESSMENT AND PLAN: 59 yo with history of CAD s/p multivessel PCI, ischemic cardiomyopathy with systolic CHF, CKD, and atrial flutter presented with atrial flutter/RVR and acute on chronic systolic CHF.   1. Acute on chronic systolic CHF: Ischemic cardiomyopathy, EF 15% by echo this admission.  He was started on milrinone due to concern for low output and sent to CCU, line was placed.  - Volume status up. Stop torsemide. Go back to 80 mg IV lasix twice a day.   - Unable to wean milrinone to 0.125 mcg. Continue milrinone at 0.25 mcg.  Will  need home milrinone. Place PICC.  -On dig 0.125 mg daily  -  Hydralazine 12.5 mg tid /Imdur 15 mg daily. Hold parameters provided.  Allergic to ace.  - Continue spironolactone 12.5 mg daily  - Will need formal RHC after diuresis prior to discharge.  As he will be anticoagulated, would try to do this from right brachial IV.  - He was evaluated for advanced  therapies 2 years ago at [facility] and deemed to be a poor candidate (apparently compliance issues and lives alone).  He has children but they do not live with him, not sure he would have much help at home.  We will need to consider his candidacy for LVAD/transplant going forward but doubt he will be candidate. .   Consult cardiac rehab 2. Atrial flutter:  - S/P AFL ablation 3/25. In NSR.  Amio was stopped.  - Continue Eliquis 5 mg bid.   3. AKI on CKD: .Renal function improving 4. CAD: h/o PCI.  Slight troponin elevation, suspect demand ischemia from CHF.   Consult case manager for home milrinone.   CLEGG,AMY  NP-C  01/09/2015 7:40 AM   Patient seen and examined with Darrick Grinder, NP. We discussed all aspects of the encounter. I agree with the assessment and plan as stated above.   Co-ox low yesterday after decreasing milrinone to 0.125. Increased to 0.25. Feeling better. Co-ox still marginal. May need to go back to 0.375 but will keep at 0.25 today. Supp K+. CVP 8-9. Continue torsemide 40 bid. May need to increase. Will place PICC for home milrinone and plan to remove LIJ TLC. Supp K+. Increase spiro to 25. No b-blocker due to low output.   Appears to remain in SR s/p ablation. Will check ECG. Hopefully LV will recover post ablation. Not candidate for advanced therapies. Plan d/c by end of week.   Lissandro Dilorenzo,MD 8:22 AM

## 2015-01-09 NOTE — Progress Notes (Signed)
Peripherally Inserted Central Catheter/Midline Placement  The IV Nurse has discussed with the patient and/or persons authorized to consent for the patient, the purpose of this procedure and the potential benefits and risks involved with this procedure.  The benefits include less needle sticks, lab draws from the catheter and patient may be discharged home with the catheter.  Risks include, but not limited to, infection, bleeding, blood clot (thrombus formation), and puncture of an artery; nerve damage and irregular heat beat.  Alternatives to this procedure were also discussed.  PICC/Midline Placement Documentation        Daniel Blankenship, Daniel Blankenship 01/09/2015, 11:35 AM

## 2015-01-09 NOTE — Progress Notes (Signed)
Inpatient Diabetes Program Recommendations  AACE/ADA: New Consensus Statement on Inpatient Glycemic Control (2013)  Target Ranges:  Prepandial:   less than 140 mg/dL      Peak postprandial:   less than 180 mg/dL (1-2 hours)      Critically ill patients:  140 - 180 mg/dL   Results for Daniel Blankenship, Alphonzo T (MRN [id number]) as of 01/09/2015 13:08  Ref. Range 01/09/2015 07:55 01/09/2015 11:48  Glucose-Capillary Latest Range: 70-99 mg/dL 086128 (H) 578231 (H)    Diabetes history: DM 2 Outpatient Diabetes medications: Pt reports being on Metformin and Lantus prior to admission Current orders for Inpatient glycemic control: Lantus 40 units QHS, Novolog 0-20 units TID, Novolog 0-5 units QHS  Inpatient Diabetes Program Recommendations  Insulin - Meal Coverage: Patient's glucose rises with meals. Glucose this am 128 mg/dl increased to 469231 mg/dl at lunch time. Please consider Novolog 4 units TID with meals for meal coverage if patient is consuming at least 50 % of meals.  HgbA1C: 12.9% 12/31/14  Thanks,  Christena DeemShannon Francis Doenges RN, MSN, [facility] Inpatient Diabetes Coordinator Team Pager [phone]

## 2015-01-10 LAB — BASIC METABOLIC PANEL
Anion gap: 3 — ABNORMAL LOW (ref 5–15)
BUN: 12 mg/dL (ref 6–23)
CO2: 35 mmol/L — ABNORMAL HIGH (ref 19–32)
Calcium: 8.1 mg/dL — ABNORMAL LOW (ref 8.4–10.5)
Chloride: 100 mmol/L (ref 96–112)
Creatinine, Ser: 1.26 mg/dL (ref 0.50–1.35)
GFR calc Af Amer: 70 mL/min — ABNORMAL LOW (ref >90)
GFR calc non Af Amer: 61 mL/min — ABNORMAL LOW (ref >90)
Glucose, Bld: 149 mg/dL — ABNORMAL HIGH (ref 70–99)
POTASSIUM: 3.3 mmol/L — AB (ref 3.5–5.1)
Sodium: 138 mmol/L (ref 135–145)

## 2015-01-10 LAB — CBC
HCT: 35.3 % — ABNORMAL LOW (ref 39.0–52.0)
HEMOGLOBIN: 11.1 g/dL — AB (ref 13.0–17.0)
MCH: 26.6 pg (ref 26.0–34.0)
MCHC: 31.4 g/dL (ref 30.0–36.0)
MCV: 84.7 fL (ref 78.0–100.0)
Platelets: 179 10*3/uL (ref 150–400)
RBC: 4.17 MIL/uL — AB (ref 4.22–5.81)
RDW: 15.4 % (ref 11.5–15.5)
WBC: 9.9 10*3/uL (ref 4.0–10.5)

## 2015-01-10 LAB — GLUCOSE, CAPILLARY
GLUCOSE-CAPILLARY: 159 mg/dL — AB (ref 70–99)
GLUCOSE-CAPILLARY: 190 mg/dL — AB (ref 70–99)
Glucose-Capillary: 118 mg/dL — ABNORMAL HIGH (ref 70–99)
Glucose-Capillary: 241 mg/dL — ABNORMAL HIGH (ref 70–99)

## 2015-01-10 LAB — CARBOXYHEMOGLOBIN
CARBOXYHEMOGLOBIN: 1.8 % — AB (ref 0.5–1.5)
Methemoglobin: 0.6 % (ref 0.0–1.5)
O2 SAT: 62.1 %
Total hemoglobin: 16.7 g/dL (ref 13.5–18.0)

## 2015-01-10 MED ORDER — METOLAZONE 2.5 MG PO TABS
2.5000 mg | ORAL_TABLET | Freq: Once | ORAL | Status: AC
  Administered 2015-01-10: 2.5 mg via ORAL
  Filled 2015-01-10: qty 1

## 2015-01-10 MED ORDER — FUROSEMIDE 10 MG/ML IJ SOLN
80.0000 mg | Freq: Two times a day (BID) | INTRAMUSCULAR | Status: DC
  Administered 2015-01-10 – 2015-01-11 (×4): 80 mg via INTRAVENOUS
  Filled 2015-01-10 (×7): qty 8

## 2015-01-10 MED ORDER — POTASSIUM CHLORIDE CRYS ER 20 MEQ PO TBCR
40.0000 meq | EXTENDED_RELEASE_TABLET | Freq: Two times a day (BID) | ORAL | Status: DC
Start: 2015-01-10 — End: 2015-01-11
  Administered 2015-01-10 (×2): 40 meq via ORAL
  Filled 2015-01-10 (×3): qty 2

## 2015-01-10 MED ORDER — POTASSIUM CHLORIDE CRYS ER 20 MEQ PO TBCR
40.0000 meq | EXTENDED_RELEASE_TABLET | Freq: Once | ORAL | Status: AC
  Administered 2015-01-10: 40 meq via ORAL
  Filled 2015-01-10: qty 2

## 2015-01-10 NOTE — Progress Notes (Signed)
Patient ID: Daniel Blankenship, male   DOB: 1956-07-20, 59 y.o.   MRN: [id number]   SUBJECTIVE:   Underwent successful atrial flutter ablation on 3/25.  He was lethargic post-extubation so was re-intubated.  He then woke up and self-extubated.  Amiodarone stopped. Maintaining NSR.   Milrinone was cut back to 0.125 mcg but co-ox went down and milrinone was increased back to 0.25 mcg. Co-ox 62% today but CVP now 15-16.  Did not have a marked diuresis yesterday.   Creatinine 1.9>1.4 > 1.35 > 1.26  Scheduled Meds: . apixaban  5 mg Oral BID  . atorvastatin  80 mg Oral q1800  . digoxin  0.125 mg Oral Daily  . docusate sodium  200 mg Oral Daily  . furosemide  80 mg Intravenous BID  . hydrALAZINE  12.5 mg Oral 3 times per day  . insulin aspart  0-20 Units Subcutaneous TID WC  . insulin aspart  0-5 Units Subcutaneous QHS  . insulin glargine  40 Units Subcutaneous QHS  . isosorbide mononitrate  30 mg Oral Daily  . metolazone  2.5 mg Oral Once  . off the beat book   Does not apply Once  . pantoprazole  40 mg Oral Daily  . potassium chloride  40 mEq Oral Once  . potassium chloride  40 mEq Oral BID  . sodium chloride  10-40 mL Intracatheter Q12H  . sodium chloride  10-40 mL Intracatheter Q12H  . sodium chloride  3 mL Intravenous Q12H  . spironolactone  25 mg Oral Daily   Continuous Infusions: . milrinone 0.25 mcg/kg/min (01/10/15 0540)   PRN Meds:.sodium chloride, acetaminophen, ALPRAZolam, ondansetron (ZOFRAN) IV, oxyCODONE-acetaminophen, promethazine, sodium chloride, sodium chloride, sodium chloride    Filed Vitals:   01/09/15 2300 01/10/15 0418 01/10/15 0504 01/10/15 0553  BP: 120/78 98/66 111/79   Pulse:  97    Temp: 97.9 F (36.6 C) 97.3 F (36.3 C)    TempSrc: Oral Oral    Resp: 20     Height:      Weight:    285 lb 11.5 oz (129.6 kg)  SpO2: 96% 94%      Intake/Output Summary (Last 24 hours) at 01/10/15 0719 Last data filed at 01/10/15 0600  Gross per 24 hour  Intake     900 ml  Output   1650 ml  Net   -750 ml    LABS: Basic Metabolic Panel:  Recent Labs  01/09/15 0900 01/10/15 0417  NA 137 138  K 3.3* 3.3*  CL 97 100  CO2 30 35*  GLUCOSE 215* 149*  BUN 15 12  CREATININE 1.34 1.26  CALCIUM 8.4 8.1*   Liver Function Tests: No results for input(s): AST, ALT, ALKPHOS, BILITOT, PROT, ALBUMIN in the last 72 hours. No results for input(s): LIPASE, AMYLASE in the last 72 hours. CBC:  Recent Labs  01/09/15 0509 01/10/15 0417  WBC 7.6 9.9  HGB 10.8* 11.1*  HCT 34.0* 35.3*  MCV 84.2 84.7  PLT 178 179   Cardiac Enzymes: No results for input(s): CKTOTAL, CKMB, CKMBINDEX, TROPONINI in the last 72 hours. BNP: Invalid input(s): POCBNP D-Dimer: No results for input(s): DDIMER in the last 72 hours. Hemoglobin A1C: No results for input(s): HGBA1C in the last 72 hours. Fasting Lipid Panel: No results for input(s): CHOL, HDL, LDLCALC, TRIG, CHOLHDL, LDLDIRECT in the last 72 hours. Thyroid Function Tests: No results for input(s): TSH, T4TOTAL, T3FREE, THYROIDAB in the last 72 hours.  Invalid input(s): FREET3 Anemia Panel:  Recent Labs  01/09/15 0900  VITAMINB12 1970*  FOLATE 16.0  FERRITIN 184  TIBC 334  IRON 46  RETICCTPCT 2.5    RADIOLOGY: Ct Abdomen Pelvis Wo Contrast  01/03/2015   CLINICAL DATA:  Abdominal pain, elevated LFTs and liver failure. Nausea.  EXAM: CT ABDOMEN AND PELVIS WITHOUT CONTRAST  TECHNIQUE: Multidetector CT imaging of the abdomen and pelvis was performed following the standard protocol without IV contrast.  COMPARISON:  Abdominal ultrasound 01/01/2015  FINDINGS: Small right pleural effusion. The heart is enlarged. There is bibasilar atelectasis in the lower lobes.  Liver is prominent in size with decreased density, suspect steatosis. There is decreased density adjacent to the falciform ligament that may reflect more focal steatosis. The questioned lesion on ultrasound is not well seen, detailed evaluation limited  given lack of intravenous contrast. The gallbladder is physiologically distended. Probable sludge in the gallbladder lumen. No pericholecystic fluid. Common bile duct is not well visualized.  Spleen is normal in size. Pancreas and adrenal glands are normal. There is no hydronephrosis or perinephric stranding. No renal stones. Suspect 1.3 cm cyst in the lower right kidney.  Stomach is decompressed. There are no dilated or thickened bowel loops. The appendix is not definitively identified. Small volume of stool throughout the colon. There is oral contrast throughout the colon.  The abdominal aorta is normal in caliber, densely calcified. No retroperitoneal adenopathy. No free air, free fluid, or intra-abdominal fluid collection. No ascites. There is a small to moderate fat containing umbilical hernia.  Within the pelvis the bladder is minimally distended. Prostate gland is normal in size. No pelvic free fluid or ascites. No pelvic adenopathy.  There is degenerative change in the lower lumbar spine. No acute osseous abnormality or focal osseous lesion.  IMPRESSION: 1. Decreased hepatic density, suspect hepatic steatosis. There is more focal fatty infiltration adjacent to the falciform ligament of the liver. The lesion on ultrasound is not seen without IV contrast, characterization recommended with MRI. 2. Probable sludge in the gallbladder. 3. Small right pleural effusion, new from prior exam. 4. Fat containing umbilical hernia.   Electronically Signed   By: Jeb Levering M.D.   On: 01/03/2015 05:40   Ct Angio Chest Pe W/cm &/or Wo Cm  12/31/2014   CLINICAL DATA:  Shortness of breath. Abdominal pain. Productive cough.  EXAM: CT ANGIOGRAPHY CHEST WITH CONTRAST  TECHNIQUE: Multidetector CT imaging of the chest was performed using the standard protocol during bolus administration of intravenous contrast. Multiplanar CT image reconstructions and MIPs were obtained to evaluate the vascular anatomy.  CONTRAST:  180m  OMNIPAQUE IOHEXOL 350 MG/ML SOLN  COMPARISON:  Chest x-ray dated 12/31/2014  FINDINGS: There are no pulmonary emboli or infiltrates or effusions. There is a 5 mm peripheral nodule in the right lower lobe on image 58 of series 6. The patient has mediastinal and hilar adenopathy. There is a 13 mm node just to the left of the aortic arch on image 29, and azygos node measuring 13 mm on image 33, and a prominent nodal mass measuring 6.6 x 2.7 x 4.8 cm in the subcarinal region. Part of the soft tissue density represents the esophagus but I think of the scar this is adenopathy.  There is cardiomegaly with dilatation of the left ventricle.  No osseous abnormality. The visualized portion of the upper abdomen is normal.  Review of the MIP images confirms the above findings.  IMPRESSION: 1. No pulmonary emboli. 2. Cardiomegaly with dilatation of the right ventricle. 3.  Mediastinal and hilar adenopathy of unknown etiology. The possibility of malignancy should be considered. 4. 5 mm nodule in the periphery of the right lower lobe. This should be followed up in 6 months with chest CT without contrast if the adenopathy is not determined to be malignant.   Electronically Signed   By: Lorriane Shire M.D.   On: 12/31/2014 17:33   Portable Chest Xray  01/06/2015   CLINICAL DATA:  Acute respiratory failure  EXAM: PORTABLE CHEST - 1 VIEW  COMPARISON:  January 05, 2015  FINDINGS: Central catheter tip is in the superior vena cava. There is a pacemaker present with leads attached to the right ventricle. No pneumothorax. There is stable cardiomegaly with mild pulmonary venous hypertension. There is airspace consolidation the left base. There are small layering effusions. No new opacity.  IMPRESSION: Findings consistent with a degree of congestive heart failure. No new opacity. No pneumothorax.   Electronically Signed   By: Lowella Grip III M.D.   On: 01/06/2015 07:51   Portable Chest Xray  01/05/2015   CLINICAL DATA:  Acute  respiratory failure  EXAM: PORTABLE CHEST - 1 VIEW  COMPARISON:  January 02, 2015  FINDINGS: There are pleural effusions bilaterally with cardiomegaly and mild pulmonary venous hypertension. There is mild airspace consolidation in the left base, stable. There is no new opacity. Central catheter tip is in the superior vena cava. Pacemaker lead is attached to the right ventricle. No pneumothorax.  IMPRESSION: Evidence of a degree of congestive heart failure. Question atelectasis or pneumonia superimposed in the left base. No new opacity compared to recent prior study.   Electronically Signed   By: Lowella Grip III M.D.   On: 01/05/2015 18:10   Dg Chest Port 1 View  01/02/2015   CLINICAL DATA:  Left central line insertion  EXAM: PORTABLE CHEST - 1 VIEW  COMPARISON:  12/31/2014  FINDINGS: New left IJ catheter, tip at the SVC level. No interval displacement of the right sided pacer/ICD.  Increased hazy density of the lower chest is likely from soft tissue attenuation. There is ongoing pulmonary venous congestion. No pneumothorax.  IMPRESSION: 1. New left IJ catheter is in good position.  No pneumothorax. 2. Unchanged cardiomegaly and pulmonary venous congestion.   Electronically Signed   By: Monte Fantasia M.D.   On: 01/02/2015 19:54   Dg Chest Port 1 View  12/31/2014   CLINICAL DATA:  Shortness of breath and chest pain  EXAM: PORTABLE CHEST - 1 VIEW  COMPARISON:  None.  FINDINGS: Cardiac shadow is enlarged. A defibrillator is noted. No focal infiltrate or sizable effusion is seen.  IMPRESSION: No active disease.   Electronically Signed   By: Inez Catalina M.D.   On: 12/31/2014 14:14   US Abdomen Limited Ruq  01/01/2015   CLINICAL DATA:  Abnormal LFTs  EXAM: US ABDOMEN LIMITED - RIGHT UPPER QUADRANT  COMPARISON:  None.  FINDINGS: Gallbladder:  There is intraluminal sludge. Gallbladder wall thickness is normal, 2.0 mm. The patient was not tender over the gallbladder.  Common bile duct:  Diameter: 5.2 mm,  normal  Liver:  The liver is enlarged, over 20 cm craniocaudal. It is diffusely increased in echogenicity consistent with fatty infiltration. There is a 2.5 cm focal hyperechoic homogeneous circumscribed lesion which likely represents a benign cavernous hemangioma.  IMPRESSION: 1. Enlarged liver with abnormally increased echogenicity. Fatty infiltration or other liver disease can produce this appearance. 2. 2.5 cm focal liver lesion near the gallbladder fossa,  not conclusively characterized but most likely a benign cavernous hemangioma. MRI with contrast would conclusively characterize this abnormality.   Electronically Signed   By: Andreas Newport M.D.   On: 01/01/2015 06:05    PHYSICAL EXAM CVP 8-9 General: NAD Neck: JVP 8-9 [facility] TLC, no thyromegaly or thyroid nodule.  Lungs: Slight crackles at bases.  CV: Lateral PMI.  Heart regular S1/S2, +S3 but tachy, no murmur.  Tr+ ankle edema.   Abdomen: Soft, NT  no hepatosplenomegaly, no distention.  Neurologic: Alert and oriented x 3.  Psych: Normal affect. Extremities: No clubbing or cyanosis.   TELEMETRY: NSR/sinus tach  Results for orders placed or performed during the hospital encounter of 12/31/14 (from the past 48 hour(s))  Glucose, capillary     Status: Abnormal   Collection Time: 01/08/15  7:22 AM  Result Value Ref Range   Glucose-Capillary 188 (H) 70 - 99 mg/dL   Comment 1 Capillary Specimen   Basic metabolic panel     Status: Abnormal   Collection Time: 01/08/15  8:27 AM  Result Value Ref Range   Sodium 138 135 - 145 mmol/L   Potassium 3.5 3.5 - 5.1 mmol/L   Chloride 98 96 - 112 mmol/L   CO2 32 19 - 32 mmol/L   Glucose, Bld 196 (H) 70 - 99 mg/dL   BUN 19 6 - 23 mg/dL   Creatinine, Ser 1.40 (H) 0.50 - 1.35 mg/dL   Calcium 8.3 (L) 8.4 - 10.5 mg/dL   GFR calc non Af Amer 54 (L) >90 mL/min   GFR calc Af Amer 62 (L) >90 mL/min    Comment: (NOTE) The eGFR has been calculated using the CKD EPI equation. This calculation has not  been validated in all clinical situations. eGFR's persistently <90 mL/min signify possible Chronic Kidney Disease.    Anion gap 8 5 - 15  Glucose, capillary     Status: Abnormal   Collection Time: 01/08/15 11:55 AM  Result Value Ref Range   Glucose-Capillary 198 (H) 70 - 99 mg/dL   Comment 1 Capillary Specimen   Carboxyhemoglobin     Status: Abnormal   Collection Time: 01/08/15 12:00 PM  Result Value Ref Range   Total hemoglobin 11.7 (L) 13.5 - 18.0 g/dL   O2 Saturation 50.9 %   Carboxyhemoglobin 1.9 (H) 0.5 - 1.5 %   Methemoglobin 0.8 0.0 - 1.5 %  Glucose, capillary     Status: Abnormal   Collection Time: 01/08/15  4:31 PM  Result Value Ref Range   Glucose-Capillary 275 (H) 70 - 99 mg/dL   Comment 1 Capillary Specimen   Glucose, capillary     Status: Abnormal   Collection Time: 01/08/15 10:16 PM  Result Value Ref Range   Glucose-Capillary 192 (H) 70 - 99 mg/dL   Comment 1 Capillary Specimen   Carboxyhemoglobin     Status: Abnormal   Collection Time: 01/09/15  4:57 AM  Result Value Ref Range   Total hemoglobin 11.0 (L) 13.5 - 18.0 g/dL   O2 Saturation 54.9 %   Carboxyhemoglobin 1.9 (H) 0.5 - 1.5 %   Methemoglobin 0.6 0.0 - 1.5 %  CBC     Status: Abnormal   Collection Time: 01/09/15  5:09 AM  Result Value Ref Range   WBC 7.6 4.0 - 10.5 K/uL   RBC 4.04 (L) 4.22 - 5.81 MIL/uL   Hemoglobin 10.8 (L) 13.0 - 17.0 g/dL   HCT 34.0 (L) 39.0 - 52.0 %   MCV 84.2 78.0 -  100.0 fL   MCH 26.7 26.0 - 34.0 pg   MCHC 31.8 30.0 - 36.0 g/dL   RDW 15.1 11.5 - 15.5 %   Platelets 178 150 - 400 K/uL  Basic metabolic panel     Status: Abnormal   Collection Time: 01/09/15  5:09 AM  Result Value Ref Range   Sodium 137 135 - 145 mmol/L   Potassium 3.3 (L) 3.5 - 5.1 mmol/L   Chloride 98 96 - 112 mmol/L   CO2 35 (H) 19 - 32 mmol/L   Glucose, Bld 139 (H) 70 - 99 mg/dL   BUN 16 6 - 23 mg/dL   Creatinine, Ser 1.35 0.50 - 1.35 mg/dL   Calcium 8.0 (L) 8.4 - 10.5 mg/dL   GFR calc non Af Amer 56  (L) >90 mL/min   GFR calc Af Amer 65 (L) >90 mL/min    Comment: (NOTE) The eGFR has been calculated using the CKD EPI equation. This calculation has not been validated in all clinical situations. eGFR's persistently <90 mL/min signify possible Chronic Kidney Disease.    Anion gap 4 (L) 5 - 15  Glucose, capillary     Status: Abnormal   Collection Time: 01/09/15  7:55 AM  Result Value Ref Range   Glucose-Capillary 128 (H) 70 - 99 mg/dL   Comment 1 Capillary Specimen   Vitamin B12     Status: Abnormal   Collection Time: 01/09/15  9:00 AM  Result Value Ref Range   Vitamin B-12 1970 (H) 211 - 911 pg/mL    Comment: Performed at [facility]  Folate     Status: None   Collection Time: 01/09/15  9:00 AM  Result Value Ref Range   Folate 16.0 ng/mL    Comment: (NOTE) Reference Ranges        Deficient:       0.4 - 3.3 ng/mL        Indeterminate:   3.4 - 5.4 ng/mL        Normal:              > 5.4 ng/mL Performed at [facility]   Iron and TIBC     Status: Abnormal   Collection Time: 01/09/15  9:00 AM  Result Value Ref Range   Iron 46 42 - 165 ug/dL   TIBC 334 215 - 435 ug/dL   Saturation Ratios 14 (L) 20 - 55 %   UIBC 288 125 - 400 ug/dL    Comment: Performed at [facility]  Ferritin     Status: None   Collection Time: 01/09/15  9:00 AM  Result Value Ref Range   Ferritin 184 22 - 322 ng/mL    Comment: Performed at [facility]  Reticulocytes     Status: None   Collection Time: 01/09/15  9:00 AM  Result Value Ref Range   Retic Ct Pct 2.5 0.4 - 3.1 %   RBC. 4.37 4.22 - 5.81 MIL/uL   Retic Count, Manual 109.3 19.0 - 186.0 K/uL  Basic metabolic panel     Status: Abnormal   Collection Time: 01/09/15  9:00 AM  Result Value Ref Range   Sodium 137 135 - 145 mmol/L   Potassium 3.3 (L) 3.5 - 5.1 mmol/L   Chloride 97 96 - 112 mmol/L   CO2 30 19 - 32 mmol/L   Glucose, Bld 215 (H) 70 - 99 mg/dL   BUN 15 6 - 23 mg/dL   Creatinine, Ser 1.34 0.50 -  1.35  mg/dL   Calcium 8.4 8.4 - 10.5 mg/dL   GFR calc non Af Amer 56 (L) >90 mL/min   GFR calc Af Amer 65 (L) >90 mL/min    Comment: (NOTE) The eGFR has been calculated using the CKD EPI equation. This calculation has not been validated in all clinical situations. eGFR's persistently <90 mL/min signify possible Chronic Kidney Disease.    Anion gap 10 5 - 15  Glucose, capillary     Status: Abnormal   Collection Time: 01/09/15 11:48 AM  Result Value Ref Range   Glucose-Capillary 231 (H) 70 - 99 mg/dL   Comment 1 Capillary Specimen   Glucose, capillary     Status: Abnormal   Collection Time: 01/09/15  4:21 PM  Result Value Ref Range   Glucose-Capillary 165 (H) 70 - 99 mg/dL   Comment 1 Capillary Specimen   Glucose, capillary     Status: Abnormal   Collection Time: 01/09/15  9:36 PM  Result Value Ref Range   Glucose-Capillary 256 (H) 70 - 99 mg/dL   Comment 1 Capillary Specimen   CBC     Status: Abnormal   Collection Time: 01/10/15  4:17 AM  Result Value Ref Range   WBC 9.9 4.0 - 10.5 K/uL   RBC 4.17 (L) 4.22 - 5.81 MIL/uL   Hemoglobin 11.1 (L) 13.0 - 17.0 g/dL   HCT 35.3 (L) 39.0 - 52.0 %   MCV 84.7 78.0 - 100.0 fL   MCH 26.6 26.0 - 34.0 pg   MCHC 31.4 30.0 - 36.0 g/dL   RDW 15.4 11.5 - 15.5 %   Platelets 179 150 - 400 K/uL  Basic metabolic panel     Status: Abnormal   Collection Time: 01/10/15  4:17 AM  Result Value Ref Range   Sodium 138 135 - 145 mmol/L   Potassium 3.3 (L) 3.5 - 5.1 mmol/L   Chloride 100 96 - 112 mmol/L   CO2 35 (H) 19 - 32 mmol/L   Glucose, Bld 149 (H) 70 - 99 mg/dL   BUN 12 6 - 23 mg/dL   Creatinine, Ser 1.26 0.50 - 1.35 mg/dL   Calcium 8.1 (L) 8.4 - 10.5 mg/dL   GFR calc non Af Amer 61 (L) >90 mL/min   GFR calc Af Amer 70 (L) >90 mL/min    Comment: (NOTE) The eGFR has been calculated using the CKD EPI equation. This calculation has not been validated in all clinical situations. eGFR's persistently <90 mL/min signify possible Chronic  Kidney Disease.    Anion gap 3 (L) 5 - 15  Carboxyhemoglobin     Status: Abnormal   Collection Time: 01/10/15  5:01 AM  Result Value Ref Range   Total hemoglobin 16.7 13.5 - 18.0 g/dL   O2 Saturation 62.1 %   Carboxyhemoglobin 1.8 (H) 0.5 - 1.5 %   Methemoglobin 0.6 0.0 - 1.5 %     ASSESSMENT AND PLAN: 59 yo with history of CAD s/p multivessel PCI, ischemic cardiomyopathy with systolic CHF, CKD, and atrial flutter presented with atrial flutter/RVR and acute on chronic systolic CHF.   1. Acute on chronic systolic CHF: Ischemic cardiomyopathy +/- component of tachy-mediated CMP from atrial flutter, EF 15% by echo this admission.  He was started on milrinone due to concern for low output and sent to CCU, line was placed.  CVP high today at 15-16.  - Unable to wean milrinone to 0.125 mcg. Continue milrinone at 0.25 mcg/kg/min.  Will need home milrinone. PICC  in place.  - On dig 0.125 mg daily  - Hydralazine 12.5 mg tid /Imdur 15 mg daily. Hold parameters provided.    - Continue spironolactone 25 mg daily  - Still volume overloaded => stop po torsemide and will give Lasix 80 mg IV bid today + 1 dose po metolazone.  - He was evaluated for advanced therapies [facility] and deemed to be a poor candidate (apparently compliance issues and lives alone).  He has children but they do not live with him, not sure he would have much help at home.  We will need to consider his candidacy for LVAD/transplant going forward but doubt he will be candidate.    - Consult cardiac rehab: Needs to walk today.  2. Atrial flutter: S/P AFL ablation 3/25. In NSR.  Amio was stopped.  - Continue Eliquis 5 mg bid.   3. AKI on CKD: Renal function improving, suspect now at baseline.  4. CAD: h/o PCI.  Slight troponin elevation, suspect demand ischemia from CHF.   Consult case manager for home milrinone.   Loralie Champagne    01/10/2015 7:19 AM

## 2015-01-10 NOTE — Progress Notes (Signed)
Abdominal pain continues resolved. Will recheck liver tests which have not been checked since the 27th.

## 2015-01-10 NOTE — Progress Notes (Signed)
Inpatient Diabetes Program Recommendations  AACE/ADA: New Consensus Statement on Inpatient Glycemic Control (2013)  Target Ranges:  Prepandial:   less than 140 mg/dL      Peak postprandial:   less than 180 mg/dL (1-2 hours)      Critically ill patients:  140 - 180 mg/dL   Results for Tommas OlpHALE, Randel T (MRN [id number]) as of 01/10/2015 08:54  Ref. Range 01/09/2015 07:55 01/09/2015 11:48 01/09/2015 16:21 01/09/2015 21:36 01/10/2015 07:58  Glucose-Capillary Latest Range: 70-99 mg/dL 213128 (H) 086231 (H) 578165 (H) 256 (H) 118 (H)    Diabetes history: DM 2 Outpatient Diabetes medications: Pt reports being on Metformin and Lantus prior to admission Current orders for Inpatient glycemic control: Lantus 40 units QHS, Novolog 0-20 units TID, Novolog 0-5 units QHS  Inpatient Diabetes Program Recommendations  Insulin - Meal Coverage: Patient's glucose increases around meal times. Please consider Novolog 4 units TID for meal coverage if patient is consuming at least 50 % of meals.  Thanks,  Christena DeemShannon Salar Molden RN, MSN, [facility] Inpatient Diabetes Coordinator Team Pager [phone]

## 2015-01-10 NOTE — Progress Notes (Addendum)
CARDIAC REHAB PHASE I   PRE:  Rate/Rhythm: 109 ST    BP: sitting 102/77    SaO2: 96 RA  MODE:  Ambulation: 90 ft   POST:  Rate/Rhythm: 120 ST    BP: sitting 109/70     SaO2: 95 RA  Pt able to stand and walk with min assist 2 person using RW. No O2 needed today. C/o lightheadedness. Did not c/o overt SOB but did not want to talk while walking due to "concentrating". Rest x2 briefly. Pt stronger today compared to Mondays walk, able to walk quicker with less rest. To recliner. Will f/u. [id number]   [name], Chiyo Fay Pine HollowKristan CES, ACSM 01/10/2015 12:08 PM

## 2015-01-10 NOTE — Progress Notes (Signed)
[facility]  [facility] Status[facility]  [facility] is providing the following services: HHRN and Home infusion pharmacy for home milrinone. [facility] providing in hospital teaching re: home Mil[facility].  [facility] follow Mr. Sheffield SliderHale until DC to support transition home.  We will be on standby awaiting final DC orders.   If patient discharges after hours, please call [phone].   Sedalia Mutaamela S Chandler 01/10/2015, 9:38 AM

## 2015-01-11 LAB — HEPATIC FUNCTION PANEL
ALK PHOS: 97 U/L (ref 39–117)
ALT: 46 U/L (ref 0–53)
AST: 23 U/L (ref 0–37)
Albumin: 2.7 g/dL — ABNORMAL LOW (ref 3.5–5.2)
BILIRUBIN DIRECT: 0.6 mg/dL — AB (ref 0.0–0.5)
BILIRUBIN TOTAL: 2.3 mg/dL — AB (ref 0.3–1.2)
Indirect Bilirubin: 1.7 mg/dL — ABNORMAL HIGH (ref 0.3–0.9)
TOTAL PROTEIN: 6.1 g/dL (ref 6.0–8.3)

## 2015-01-11 LAB — BASIC METABOLIC PANEL
Anion gap: 9 (ref 5–15)
BUN: 14 mg/dL (ref 6–23)
CALCIUM: 8.7 mg/dL (ref 8.4–10.5)
CO2: 29 mmol/L (ref 19–32)
CREATININE: 1.35 mg/dL (ref 0.50–1.35)
Chloride: 99 mmol/L (ref 96–112)
GFR calc non Af Amer: 56 mL/min — ABNORMAL LOW (ref >90)
GFR, EST AFRICAN AMERICAN: 65 mL/min — AB (ref >90)
GLUCOSE: 185 mg/dL — AB (ref 70–99)
POTASSIUM: 3.8 mmol/L (ref 3.5–5.1)
Sodium: 137 mmol/L (ref 135–145)

## 2015-01-11 LAB — CARBOXYHEMOGLOBIN
CARBOXYHEMOGLOBIN: 2.1 % — AB (ref 0.5–1.5)
Methemoglobin: 0.9 % (ref 0.0–1.5)
O2 SAT: 55.3 %
Total hemoglobin: 11.9 g/dL — ABNORMAL LOW (ref 13.5–18.0)

## 2015-01-11 LAB — GLUCOSE, CAPILLARY
Glucose-Capillary: 124 mg/dL — ABNORMAL HIGH (ref 70–99)
Glucose-Capillary: 229 mg/dL — ABNORMAL HIGH (ref 70–99)
Glucose-Capillary: 179 mg/dL — ABNORMAL HIGH (ref 70–99)
Glucose-Capillary: 176 mg/dL — ABNORMAL HIGH (ref 70–99)

## 2015-01-11 LAB — CBC
HCT: 37.1 % — ABNORMAL LOW (ref 39.0–52.0)
Hemoglobin: 11.7 g/dL — ABNORMAL LOW (ref 13.0–17.0)
MCH: 26.7 pg (ref 26.0–34.0)
MCHC: 31.5 g/dL (ref 30.0–36.0)
MCV: 84.7 fL (ref 78.0–100.0)
PLATELETS: 179 10*3/uL (ref 150–400)
RBC: 4.38 MIL/uL (ref 4.22–5.81)
RDW: 15.2 % (ref 11.5–15.5)
WBC: 10.6 10*3/uL — ABNORMAL HIGH (ref 4.0–10.5)

## 2015-01-11 MED ORDER — COLCHICINE 0.6 MG PO TABS
0.6000 mg | ORAL_TABLET | Freq: Two times a day (BID) | ORAL | Status: AC
  Administered 2015-01-11 (×2): 0.6 mg via ORAL
  Filled 2015-01-11 (×2): qty 1

## 2015-01-11 MED ORDER — POTASSIUM CHLORIDE CRYS ER 20 MEQ PO TBCR
40.0000 meq | EXTENDED_RELEASE_TABLET | Freq: Once | ORAL | Status: AC
  Administered 2015-01-11: 40 meq via ORAL
  Filled 2015-01-11: qty 2

## 2015-01-11 MED ORDER — POTASSIUM CHLORIDE CRYS ER 20 MEQ PO TBCR
40.0000 meq | EXTENDED_RELEASE_TABLET | Freq: Two times a day (BID) | ORAL | Status: DC
  Administered 2015-01-11 – 2015-01-12 (×2): 40 meq via ORAL
  Filled 2015-01-11 (×2): qty 2

## 2015-01-11 MED ORDER — COLCHICINE 0.6 MG PO TABS
0.6000 mg | ORAL_TABLET | Freq: Every day | ORAL | Status: DC
  Administered 2015-01-12: 0.6 mg via ORAL
  Filled 2015-01-11: qty 1

## 2015-01-11 MED ORDER — INSULIN ASPART 100 UNIT/ML ~~LOC~~ SOLN
0.0000 [IU] | Freq: Three times a day (TID) | SUBCUTANEOUS | Status: DC
  Administered 2015-01-11: 4 [IU] via SUBCUTANEOUS
  Administered 2015-01-12: 7 [IU] via SUBCUTANEOUS
  Administered 2015-01-12 (×2): 4 [IU] via SUBCUTANEOUS

## 2015-01-11 MED ORDER — METOLAZONE 2.5 MG PO TABS
2.5000 mg | ORAL_TABLET | Freq: Once | ORAL | Status: AC
  Administered 2015-01-11: 2.5 mg via ORAL
  Filled 2015-01-11: qty 1

## 2015-01-11 NOTE — Progress Notes (Signed)
[facility] Progress Note  Subjective: No abdominal pain, tolerating diet  Objective: Vital signs in last 24 hours: Temp:  [97.6 F (36.4 C)-98.4 F (36.9 C)] 97.9 F (36.6 C) (03/31 0802) Pulse Rate:  [101-112] 105 (03/31 0802) Resp:  [18-20] 18 (03/31 0802) BP: (94-127)/(43-84) 103/61 mmHg (03/31 0802) SpO2:  [94 %-99 %] 98 % (03/31 0802) Weight:  [128.9 kg (284 lb 2.8 oz)-129.4 kg (285 lb 4.4 oz)] 128.9 kg (284 lb 2.8 oz) (03/31 0650) Weight change: -0.2 kg (-7.1 oz)   PE: Abdomen soft slightly distended with normoactive bowel sounds  Lab Results: Results for orders placed or performed during the hospital encounter of 12/31/14 (from the past 24 hour(s))  Glucose, capillary     Status: Abnormal   Collection Time: 01/10/15  1:11 PM  Result Value Ref Range   Glucose-Capillary 159 (H) 70 - 99 mg/dL  Glucose, capillary     Status: Abnormal   Collection Time: 01/10/15  4:54 PM  Result Value Ref Range   Glucose-Capillary 190 (H) 70 - 99 mg/dL   Comment 1 Capillary Specimen   Glucose, capillary     Status: Abnormal   Collection Time: 01/10/15  8:59 PM  Result Value Ref Range   Glucose-Capillary 241 (H) 70 - 99 mg/dL   Comment 1 Capillary Specimen   CBC     Status: Abnormal   Collection Time: 01/11/15  4:40 AM  Result Value Ref Range   WBC 10.6 (H) 4.0 - 10.5 K/uL   RBC 4.38 4.22 - 5.81 MIL/uL   Hemoglobin 11.7 (L) 13.0 - 17.0 g/dL   HCT 78.2 (L) 95.6 - 21.3 %   MCV 84.7 78.0 - 100.0 fL   MCH 26.7 26.0 - 34.0 pg   MCHC 31.5 30.0 - 36.0 g/dL   RDW 08.6 57.8 - 46.9 %   Platelets 179 150 - 400 K/uL  Carboxyhemoglobin     Status: Abnormal   Collection Time: 01/11/15  4:40 AM  Result Value Ref Range   Total hemoglobin 11.9 (L) 13.5 - 18.0 g/dL   O2 Saturation 62.9 %   Carboxyhemoglobin 2.1 (H) 0.5 - 1.5 %   Methemoglobin 0.9 0.0 - 1.5 %  Basic metabolic panel     Status: Abnormal   Collection Time: 01/11/15  4:40 AM  Result Value Ref Range   Sodium 137 135 -  145 mmol/L   Potassium 3.8 3.5 - 5.1 mmol/L   Chloride 99 96 - 112 mmol/L   CO2 29 19 - 32 mmol/L   Glucose, Bld 185 (H) 70 - 99 mg/dL   BUN 14 6 - 23 mg/dL   Creatinine, Ser 5.28 0.50 - 1.35 mg/dL   Calcium 8.7 8.4 - 41.3 mg/dL   GFR calc non Af Amer 56 (L) >90 mL/min   GFR calc Af Amer 65 (L) >90 mL/min   Anion gap 9 5 - 15  Hepatic function panel     Status: Abnormal   Collection Time: 01/11/15  4:40 AM  Result Value Ref Range   Total Protein 6.1 6.0 - 8.3 g/dL   Albumin 2.7 (L) 3.5 - 5.2 g/dL   AST 23 0 - 37 U/L   ALT 46 0 - 53 U/L   Alkaline Phosphatase 97 39 - 117 U/L   Total Bilirubin 2.3 (H) 0.3 - 1.2 mg/dL   Bilirubin, Direct 0.6 (H) 0.0 - 0.5 mg/dL   Indirect Bilirubin 1.7 (H) 0.3 - 0.9 mg/dL    Studies/Results: No results found.  Assessment: Elevated liver function tests improving, probably secondary to congestive heart failure. Lower abdominal pain resolved  Plan: No further GI workup planned at present. We'll sign off for now. Please call if further GI input needed.    Valma Rotenberg C 01/11/2015, 8:36 AM

## 2015-01-11 NOTE — Evaluation (Signed)
Physical Therapy Evaluation Patient Details Name: Daniel Blankenship MRN: [id number] DOB: August 02, 1956 Today's Date: 01/11/2015   History of Present Illness  Pt adm with acute on chronic systolic heart failure. Pt also with a-flutter and underwent ablation 3/25. Reintubated post procedure due to lethargy. Self extubated. PMH - cardiomyopathy, DM, HTN,   Clinical Impression  Pt admitted with above diagnosis. Pt currently with functional limitations due to the deficits listed below (see PT Problem List).  Pt will benefit from skilled PT to increase their independence and safety with mobility to allow discharge to the venue listed below.  Will follow acutely but do not feel he will need PT at dc.     Follow Up Recommendations No PT follow up    Equipment Recommendations  None recommended by PT    Recommendations for Other Services       Precautions / Restrictions Precautions Precautions: None Restrictions Weight Bearing Restrictions: No      Mobility  Bed Mobility                  Transfers Overall transfer level: Needs assistance Equipment used: Rolling walker (2 wheeled) Transfers: Sit to/from Stand Sit to Stand: Min guard            Ambulation/Gait Ambulation/Gait assistance: Supervision Ambulation Distance (Feet): 150 Feet Assistive device: Rolling walker (2 wheeled) Gait Pattern/deviations: Step-through pattern;Decreased step length - right;Decreased step length - left;Trunk flexed   Gait velocity interpretation: Below normal speed for age/gender General Gait Details: Pt required 2 brief standing rest breaks.  Stairs            Wheelchair Mobility    Modified Rankin (Stroke Patients Only)       Balance Overall balance assessment: Needs assistance Sitting-balance support: No upper extremity supported;Feet supported Sitting balance-Leahy Scale: Good     Standing balance support: No upper extremity supported;During functional  activity Standing balance-Leahy Scale: Fair                               Pertinent Vitals/Pain Pain Assessment: Faces Faces Pain Scale: Hurts even more Pain Location: lt hand Pain Intervention(s): Limited activity within patient's tolerance;Repositioned    Home Living Family/patient expects to be discharged to:: Private residence Living Arrangements: Alone   Type of Home: House Home Access: Stairs to enter   Secretary/administrator of Steps: 1 Home Layout: One level Home Equipment: Walker - 2 wheels      Prior Function Level of Independence: Independent with assistive device(s)         Comments: Uses rolling walker at home.     Hand Dominance        Extremity/Trunk Assessment   Upper Extremity Assessment: LUE deficits/detail       LUE Deficits / Details: Lt hand limited by gout   Lower Extremity Assessment: Generalized weakness         Communication   Communication: No difficulties  Cognition Arousal/Alertness: Awake/alert Behavior During Therapy: WFL for tasks assessed/performed Overall Cognitive Status: Within Functional Limits for tasks assessed                      General Comments      Exercises        Assessment/Plan    PT Assessment Patient needs continued PT services  PT Diagnosis Difficulty walking;Generalized weakness   PT Problem List Decreased strength;Decreased activity tolerance;Decreased balance;Decreased mobility;Obesity  PT Treatment Interventions  DME instruction;Gait training;Balance training;Functional mobility training;Therapeutic activities;Therapeutic exercise;Patient/family education   PT Goals (Current goals can be found in the Care Plan section) Acute Rehab PT Goals Patient Stated Goal: Return home PT Goal Formulation: With patient Time For Goal Achievement: 01/18/15 Potential to Achieve Goals: Good    Frequency Min 3X/week   Barriers to discharge        Co-evaluation                End of Session   Activity Tolerance: Patient tolerated treatment well Patient left: in chair;with call bell/phone within reach Nurse Communication: Mobility status         Time: [id number] PT Time Calculation (min) (ACUTE ONLY): 26 min   Charges:   PT Evaluation $Initial PT Evaluation Tier I: 1 Procedure PT Treatments $Gait Training: 8-22 mins   PT G Codes:        Shirl Weir 01/11/2015, 10:43 AM  Skip Mayerary Geraldo Haris PT [phone]

## 2015-01-11 NOTE — Progress Notes (Signed)
[id number] Offered to walk with pt who declined. We had this morning around 1130 and he asked us to check back. Pt stated he walked with PT and he is tired now.  Encouraged to walk later with staff. Pt able to answer teach back questions re weight gain, fluid restriction and sodium restriction. Encourage pt to continue to walk as much as he can.Marland Kitchen. He stated he has walker at home and is considering getting ergometer to work legs. Luetta NuttingCharlene Rami Waddle RN BSN 01/11/2015 2:46 PM

## 2015-01-11 NOTE — Progress Notes (Signed)
Results for Tommas OlpHALE, Michaell T (MRN [id number]) as of 01/11/2015 12:40  Ref. Range 01/10/2015 20:59 01/11/2015 08:05 01/11/2015 11:32  Glucose-Capillary Latest Range: 70-99 mg/dL 324241 (H) 401124 (H) 027229 (H)  Postprandial blood sugars continue to be greater than 180 mg/dl.  Recommend continuing current insulin orders and adding Novolog 3-4 units TID with meals if CBGs continue to be elevated. Will continue to follow while in hospital. Smith MinceKendra Tonae Livolsi RN BSN CDE

## 2015-01-11 NOTE — Progress Notes (Signed)
Patient ID: Daniel Blankenship, male   DOB: February 05, 1956, 59 y.o.   MRN: [id number]   SUBJECTIVE:   Underwent successful atrial flutter ablation on 3/25.  He was lethargic post-extubation so was re-intubated.  He then woke up and self-extubated.  Amiodarone stopped. Maintaining NSR.   Milrinone was cut back to 0.125 mcg but co-ox went down and milrinone was increased back to 0.25 mcg. Co-ox improved.  Back to IV Lasix + metolazone yesterday due to volume overload.  He diuresed well yesterday.  CVP remains about 13 this morning. Walked yesterday, needed assist but doing better.   Creatinine 1.9>1.4 > 1.35 > 1.26 > 1.35  Scheduled Meds: . apixaban  5 mg Oral BID  . atorvastatin  80 mg Oral q1800  . digoxin  0.125 mg Oral Daily  . docusate sodium  200 mg Oral Daily  . furosemide  80 mg Intravenous BID  . hydrALAZINE  12.5 mg Oral 3 times per day  . insulin aspart  0-20 Units Subcutaneous TID WC  . insulin aspart  0-5 Units Subcutaneous QHS  . insulin glargine  40 Units Subcutaneous QHS  . isosorbide mononitrate  30 mg Oral Daily  . metolazone  2.5 mg Oral Once  . off the beat book   Does not apply Once  . pantoprazole  40 mg Oral Daily  . potassium chloride  40 mEq Oral BID  . potassium chloride  40 mEq Oral Once  . sodium chloride  10-40 mL Intracatheter Q12H  . sodium chloride  10-40 mL Intracatheter Q12H  . sodium chloride  3 mL Intravenous Q12H  . spironolactone  25 mg Oral Daily   Continuous Infusions: . milrinone 0.25 mcg/kg/min (01/11/15 0600)   PRN Meds:.sodium chloride, acetaminophen, ALPRAZolam, ondansetron (ZOFRAN) IV, oxyCODONE-acetaminophen, promethazine, sodium chloride, sodium chloride, sodium chloride    Filed Vitals:   01/10/15 1949 01/10/15 2327 01/11/15 0508 01/11/15 0650  BP: 94/61 100/58 100/43   Pulse:  103    Temp: 97.8 F (36.6 C) 98.4 F (36.9 C) 97.6 F (36.4 C)   TempSrc: Oral Oral Oral   Resp: _0 Height:      Weight:   285 lb 4.4 oz  (129.4 kg) 284 lb 2.8 oz (128.9 kg)  SpO2: 95% 99% 98%     Intake/Output Summary (Last 24 hours) at 01/11/15 0742 Last data filed at 01/11/15 0600  Gross per 24 hour  Intake   1188 ml  Output   4155 ml  Net  -2967 ml    LABS: Basic Metabolic Panel:  Recent Labs  01/10/15 0417 01/11/15 0440  NA 138 137  K 3.3* 3.8  CL 100 99  CO2 35* 29  GLUCOSE 149* 185*  BUN 12 14  CREATININE 1.26 1.35  CALCIUM 8.1* 8.7   Liver Function Tests:  Recent Labs  01/11/15 0440  AST 23  ALT 46  ALKPHOS 97  BILITOT 2.3*  PROT 6.1  ALBUMIN 2.7*   No results for input(s): LIPASE, AMYLASE in the last 72 hours. CBC:  Recent Labs  01/10/15 0417 01/11/15 0440  WBC 9.9 10.6*  HGB 11.1* 11.7*  HCT 35.3* 37.1*  MCV 84.7 84.7  PLT 179 179   Cardiac Enzymes: No results for input(s): CKTOTAL, CKMB, CKMBINDEX, TROPONINI in the last 72 hours. BNP: Invalid input(s): POCBNP D-Dimer: No results for input(s): DDIMER in the last 72 hours. Hemoglobin A1C: No results for input(s): HGBA1C in the last 72 hours. Fasting Lipid Panel: No  results for input(s): CHOL, HDL, LDLCALC, TRIG, CHOLHDL, LDLDIRECT in the last 72 hours. Thyroid Function Tests: No results for input(s): TSH, T4TOTAL, T3FREE, THYROIDAB in the last 72 hours.  Invalid input(s): FREET3 Anemia Panel:  Recent Labs  01/09/15 0900  VITAMINB12 1970*  FOLATE 16.0  FERRITIN 184  TIBC 334  IRON 46  RETICCTPCT 2.5    RADIOLOGY: Ct Abdomen Pelvis Wo Contrast  01/03/2015   CLINICAL DATA:  Abdominal pain, elevated LFTs and liver failure. Nausea.  EXAM: CT ABDOMEN AND PELVIS WITHOUT CONTRAST  TECHNIQUE: Multidetector CT imaging of the abdomen and pelvis was performed following the standard protocol without IV contrast.  COMPARISON:  Abdominal ultrasound 01/01/2015  FINDINGS: Small right pleural effusion. The heart is enlarged. There is bibasilar atelectasis in the lower lobes.  Liver is prominent in size with decreased density,  suspect steatosis. There is decreased density adjacent to the falciform ligament that may reflect more focal steatosis. The questioned lesion on ultrasound is not well seen, detailed evaluation limited given lack of intravenous contrast. The gallbladder is physiologically distended. Probable sludge in the gallbladder lumen. No pericholecystic fluid. Common bile duct is not well visualized.  Spleen is normal in size. Pancreas and adrenal glands are normal. There is no hydronephrosis or perinephric stranding. No renal stones. Suspect 1.3 cm cyst in the lower right kidney.  Stomach is decompressed. There are no dilated or thickened bowel loops. The appendix is not definitively identified. Small volume of stool throughout the colon. There is oral contrast throughout the colon.  The abdominal aorta is normal in caliber, densely calcified. No retroperitoneal adenopathy. No free air, free fluid, or intra-abdominal fluid collection. No ascites. There is a small to moderate fat containing umbilical hernia.  Within the pelvis the bladder is minimally distended. Prostate gland is normal in size. No pelvic free fluid or ascites. No pelvic adenopathy.  There is degenerative change in the lower lumbar spine. No acute osseous abnormality or focal osseous lesion.  IMPRESSION: 1. Decreased hepatic density, suspect hepatic steatosis. There is more focal fatty infiltration adjacent to the falciform ligament of the liver. The lesion on ultrasound is not seen without IV contrast, characterization recommended with MRI. 2. Probable sludge in the gallbladder. 3. Small right pleural effusion, new from prior exam. 4. Fat containing umbilical hernia.   Electronically Signed   By: Jeb Levering M.D.   On: 01/03/2015 05:40   Ct Angio Chest Pe W/cm &/or Wo Cm  12/31/2014   CLINICAL DATA:  Shortness of breath. Abdominal pain. Productive cough.  EXAM: CT ANGIOGRAPHY CHEST WITH CONTRAST  TECHNIQUE: Multidetector CT imaging of the chest was  performed using the standard protocol during bolus administration of intravenous contrast. Multiplanar CT image reconstructions and MIPs were obtained to evaluate the vascular anatomy.  CONTRAST:  155m OMNIPAQUE IOHEXOL 350 MG/ML SOLN  COMPARISON:  Chest x-ray dated 12/31/2014  FINDINGS: There are no pulmonary emboli or infiltrates or effusions. There is a 5 mm peripheral nodule in the right lower lobe on image 58 of series 6. The patient has mediastinal and hilar adenopathy. There is a 13 mm node just to the left of the aortic arch on image 29, and azygos node measuring 13 mm on image 33, and a prominent nodal mass measuring 6.6 x 2.7 x 4.8 cm in the subcarinal region. Part of the soft tissue density represents the esophagus but I think of the scar this is adenopathy.  There is cardiomegaly with dilatation of the left ventricle.  No osseous abnormality. The visualized portion of the upper abdomen is normal.  Review of the MIP images confirms the above findings.  IMPRESSION: 1. No pulmonary emboli. 2. Cardiomegaly with dilatation of the right ventricle. 3. Mediastinal and hilar adenopathy of unknown etiology. The possibility of malignancy should be considered. 4. 5 mm nodule in the periphery of the right lower lobe. This should be followed up in 6 months with chest CT without contrast if the adenopathy is not determined to be malignant.   Electronically Signed   By: Lorriane Shire M.D.   On: 12/31/2014 17:33   Portable Chest Xray  01/06/2015   CLINICAL DATA:  Acute respiratory failure  EXAM: PORTABLE CHEST - 1 VIEW  COMPARISON:  January 05, 2015  FINDINGS: Central catheter tip is in the superior vena cava. There is a pacemaker present with leads attached to the right ventricle. No pneumothorax. There is stable cardiomegaly with mild pulmonary venous hypertension. There is airspace consolidation the left base. There are small layering effusions. No new opacity.  IMPRESSION: Findings consistent with a degree of  congestive heart failure. No new opacity. No pneumothorax.   Electronically Signed   By: Lowella Grip III M.D.   On: 01/06/2015 07:51   Portable Chest Xray  01/05/2015   CLINICAL DATA:  Acute respiratory failure  EXAM: PORTABLE CHEST - 1 VIEW  COMPARISON:  January 02, 2015  FINDINGS: There are pleural effusions bilaterally with cardiomegaly and mild pulmonary venous hypertension. There is mild airspace consolidation in the left base, stable. There is no new opacity. Central catheter tip is in the superior vena cava. Pacemaker lead is attached to the right ventricle. No pneumothorax.  IMPRESSION: Evidence of a degree of congestive heart failure. Question atelectasis or pneumonia superimposed in the left base. No new opacity compared to recent prior study.   Electronically Signed   By: Lowella Grip III M.D.   On: 01/05/2015 18:10   Dg Chest Port 1 View  01/02/2015   CLINICAL DATA:  Left central line insertion  EXAM: PORTABLE CHEST - 1 VIEW  COMPARISON:  12/31/2014  FINDINGS: New left IJ catheter, tip at the SVC level. No interval displacement of the right sided pacer/ICD.  Increased hazy density of the lower chest is likely from soft tissue attenuation. There is ongoing pulmonary venous congestion. No pneumothorax.  IMPRESSION: 1. New left IJ catheter is in good position.  No pneumothorax. 2. Unchanged cardiomegaly and pulmonary venous congestion.   Electronically Signed   By: Monte Fantasia M.D.   On: 01/02/2015 19:54   Dg Chest Port 1 View  12/31/2014   CLINICAL DATA:  Shortness of breath and chest pain  EXAM: PORTABLE CHEST - 1 VIEW  COMPARISON:  None.  FINDINGS: Cardiac shadow is enlarged. A defibrillator is noted. No focal infiltrate or sizable effusion is seen.  IMPRESSION: No active disease.   Electronically Signed   By: Inez Catalina M.D.   On: 12/31/2014 14:14   US Abdomen Limited Ruq  01/01/2015   CLINICAL DATA:  Abnormal LFTs  EXAM: US ABDOMEN LIMITED - RIGHT UPPER QUADRANT  COMPARISON:   None.  FINDINGS: Gallbladder:  There is intraluminal sludge. Gallbladder wall thickness is normal, 2.0 mm. The patient was not tender over the gallbladder.  Common bile duct:  Diameter: 5.2 mm, normal  Liver:  The liver is enlarged, over 20 cm craniocaudal. It is diffusely increased in echogenicity consistent with fatty infiltration. There is a 2.5 cm focal hyperechoic homogeneous circumscribed  lesion which likely represents a benign cavernous hemangioma.  IMPRESSION: 1. Enlarged liver with abnormally increased echogenicity. Fatty infiltration or other liver disease can produce this appearance. 2. 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 MRI with contrast would conclusively characterize this abnormality.   Electronically Signed   By: Andreas Newport M.D.   On: 01/01/2015 06:05    PHYSICAL EXAM CVP 13 General: NAD Neck: JVP 10, no thyromegaly or thyroid nodule.  Lungs: Slight crackles at bases.  CV: Lateral PMI.  Heart tachy, regular S1/S2, no S3, no murmur.  Trace ankle edema.   Abdomen: Soft, NT  no hepatosplenomegaly, no distention.  Neurologic: Alert and oriented x 3.  Psych: Normal affect. Extremities: No clubbing or cyanosis.   TELEMETRY: NSR/sinus tach  Results for orders placed or performed during the hospital encounter of 12/31/14 (from the past 48 hour(s))  Glucose, capillary     Status: Abnormal   Collection Time: 01/09/15  7:55 AM  Result Value Ref Range   Glucose-Capillary 128 (H) 70 - 99 mg/dL   Comment 1 Capillary Specimen   Vitamin B12     Status: Abnormal   Collection Time: 01/09/15  9:00 AM  Result Value Ref Range   Vitamin B-12 1970 (H) 211 - 911 pg/mL    Comment: Performed at [facility]  Folate     Status: None   Collection Time: 01/09/15  9:00 AM  Result Value Ref Range   Folate 16.0 ng/mL    Comment: (NOTE) Reference Ranges        Deficient:       0.4 - 3.3  ng/mL        Indeterminate:   3.4 - 5.4 ng/mL        Normal:              > 5.4 ng/mL Performed at [facility]   Iron and TIBC     Status: Abnormal   Collection Time: 01/09/15  9:00 AM  Result Value Ref Range   Iron 46 42 - 165 ug/dL   TIBC 334 215 - 435 ug/dL   Saturation Ratios 14 (L) 20 - 55 %   UIBC 288 125 - 400 ug/dL    Comment: Performed at [facility]  Ferritin     Status: None   Collection Time: 01/09/15  9:00 AM  Result Value Ref Range   Ferritin 184 22 - 322 ng/mL    Comment: Performed at [facility]  Reticulocytes     Status: None   Collection Time: 01/09/15  9:00 AM  Result Value Ref Range   Retic Ct Pct 2.5 0.4 - 3.1 %   RBC. 4.37 4.22 - 5.81 MIL/uL   Retic Count, Manual 109.3 19.0 - 186.0 K/uL  Basic metabolic panel     Status: Abnormal   Collection Time: 01/09/15  9:00 AM  Result Value Ref Range   Sodium 137 135 - 145 mmol/L   Potassium 3.3 (L) 3.5 - 5.1 mmol/L   Chloride 97 96 - 112 mmol/L   CO2 30 19 - 32 mmol/L   Glucose, Bld 215 (H) 70 - 99 mg/dL   BUN 15 6 - 23 mg/dL   Creatinine, Ser 1.34 0.50 - 1.35 mg/dL   Calcium 8.4 8.4 - 10.5 mg/dL   GFR calc non Af Amer 56 (L) >90 mL/min   GFR calc Af Amer 65 (L) >90 mL/min  Comment: (NOTE) The eGFR has been calculated using the CKD EPI equation. This calculation has not been validated in all clinical situations. eGFR's persistently <90 mL/min signify possible Chronic Kidney Disease.    Anion gap 10 5 - 15  Glucose, capillary     Status: Abnormal   Collection Time: 01/09/15 11:48 AM  Result Value Ref Range   Glucose-Capillary 231 (H) 70 - 99 mg/dL   Comment 1 Capillary Specimen   Glucose, capillary     Status: Abnormal   Collection Time: 01/09/15  4:21 PM  Result Value Ref Range   Glucose-Capillary 165 (H) 70 - 99 mg/dL   Comment 1 Capillary Specimen   Glucose, capillary     Status: Abnormal   Collection Time: 01/09/15  9:36 PM  Result Value Ref Range   Glucose-Capillary  256 (H) 70 - 99 mg/dL   Comment 1 Capillary Specimen   CBC     Status: Abnormal   Collection Time: 01/10/15  4:17 AM  Result Value Ref Range   WBC 9.9 4.0 - 10.5 K/uL   RBC 4.17 (L) 4.22 - 5.81 MIL/uL   Hemoglobin 11.1 (L) 13.0 - 17.0 g/dL   HCT 35.3 (L) 39.0 - 52.0 %   MCV 84.7 78.0 - 100.0 fL   MCH 26.6 26.0 - 34.0 pg   MCHC 31.4 30.0 - 36.0 g/dL   RDW 15.4 11.5 - 15.5 %   Platelets 179 150 - 400 K/uL  Basic metabolic panel     Status: Abnormal   Collection Time: 01/10/15  4:17 AM  Result Value Ref Range   Sodium 138 135 - 145 mmol/L   Potassium 3.3 (L) 3.5 - 5.1 mmol/L   Chloride 100 96 - 112 mmol/L   CO2 35 (H) 19 - 32 mmol/L   Glucose, Bld 149 (H) 70 - 99 mg/dL   BUN 12 6 - 23 mg/dL   Creatinine, Ser 1.26 0.50 - 1.35 mg/dL   Calcium 8.1 (L) 8.4 - 10.5 mg/dL   GFR calc non Af Amer 61 (L) >90 mL/min   GFR calc Af Amer 70 (L) >90 mL/min    Comment: (NOTE) The eGFR has been calculated using the CKD EPI equation. This calculation has not been validated in all clinical situations. eGFR's persistently <90 mL/min signify possible Chronic Kidney Disease.    Anion gap 3 (L) 5 - 15  Carboxyhemoglobin     Status: Abnormal   Collection Time: 01/10/15  5:01 AM  Result Value Ref Range   Total hemoglobin 16.7 13.5 - 18.0 g/dL   O2 Saturation 62.1 %   Carboxyhemoglobin 1.8 (H) 0.5 - 1.5 %   Methemoglobin 0.6 0.0 - 1.5 %  Glucose, capillary     Status: Abnormal   Collection Time: 01/10/15  7:58 AM  Result Value Ref Range   Glucose-Capillary 118 (H) 70 - 99 mg/dL  Glucose, capillary     Status: Abnormal   Collection Time: 01/10/15  1:11 PM  Result Value Ref Range   Glucose-Capillary 159 (H) 70 - 99 mg/dL  Glucose, capillary     Status: Abnormal   Collection Time: 01/10/15  4:54 PM  Result Value Ref Range   Glucose-Capillary 190 (H) 70 - 99 mg/dL   Comment 1 Capillary Specimen   Glucose, capillary     Status: Abnormal   Collection Time: 01/10/15  8:59 PM  Result Value Ref  Range   Glucose-Capillary 241 (H) 70 - 99 mg/dL   Comment 1 Capillary Specimen  CBC     Status: Abnormal   Collection Time: 01/11/15  4:40 AM  Result Value Ref Range   WBC 10.6 (H) 4.0 - 10.5 K/uL   RBC 4.38 4.22 - 5.81 MIL/uL   Hemoglobin 11.7 (L) 13.0 - 17.0 g/dL   HCT 37.1 (L) 39.0 - 52.0 %   MCV 84.7 78.0 - 100.0 fL   MCH 26.7 26.0 - 34.0 pg   MCHC 31.5 30.0 - 36.0 g/dL   RDW 15.2 11.5 - 15.5 %   Platelets 179 150 - 400 K/uL  Carboxyhemoglobin     Status: Abnormal   Collection Time: 01/11/15  4:40 AM  Result Value Ref Range   Total hemoglobin 11.9 (L) 13.5 - 18.0 g/dL   O2 Saturation 55.3 %   Carboxyhemoglobin 2.1 (H) 0.5 - 1.5 %   Methemoglobin 0.9 0.0 - 1.5 %  Basic metabolic panel     Status: Abnormal   Collection Time: 01/11/15  4:40 AM  Result Value Ref Range   Sodium 137 135 - 145 mmol/L   Potassium 3.8 3.5 - 5.1 mmol/L   Chloride 99 96 - 112 mmol/L   CO2 29 19 - 32 mmol/L   Glucose, Bld 185 (H) 70 - 99 mg/dL   BUN 14 6 - 23 mg/dL   Creatinine, Ser 1.35 0.50 - 1.35 mg/dL   Calcium 8.7 8.4 - 10.5 mg/dL   GFR calc non Af Amer 56 (L) >90 mL/min   GFR calc Af Amer 65 (L) >90 mL/min    Comment: (NOTE) The eGFR has been calculated using the CKD EPI equation. This calculation has not been validated in all clinical situations. eGFR's persistently <90 mL/min signify possible Chronic Kidney Disease.    Anion gap 9 5 - 15  Hepatic function panel     Status: Abnormal   Collection Time: 01/11/15  4:40 AM  Result Value Ref Range   Total Protein 6.1 6.0 - 8.3 g/dL   Albumin 2.7 (L) 3.5 - 5.2 g/dL   AST 23 0 - 37 U/L   ALT 46 0 - 53 U/L   Alkaline Phosphatase 97 39 - 117 U/L   Total Bilirubin 2.3 (H) 0.3 - 1.2 mg/dL   Bilirubin, Direct 0.6 (H) 0.0 - 0.5 mg/dL   Indirect Bilirubin 1.7 (H) 0.3 - 0.9 mg/dL     ASSESSMENT AND PLAN: 59 yo with history of CAD s/p multivessel PCI, ischemic cardiomyopathy with systolic CHF, CKD, and atrial flutter presented with atrial  flutter/RVR and acute on chronic systolic CHF.   1. Acute on chronic systolic CHF: Ischemic cardiomyopathy +/- component of tachy-mediated CMP from atrial flutter, EF 15% by echo this admission.  He was started on milrinone due to concern for low output and sent to CCU, line was placed.  He diuresed well yesterday, CVP 13 this morning.  - Unable to wean milrinone to 0.125 mcg. Continue milrinone at 0.25 mcg/kg/min.  Will need home milrinone. PICC in place.  - On dig 0.125 mg daily, will check level tomorrow.   - Hydralazine 12.5 mg tid /Imdur 15 mg daily.  Will not titrate with soft blood pressure.  - Continue spironolactone 25 mg daily  - No ACEI yet with low BP and recent AKI, no beta blocker with low output.  - Still volume overloaded will continue Lasix 80 mg IV bid today + 1 dose po metolazone. Hopefully to po diuretics tomorrow.  - He was evaluated for advanced therapies 2 years ago at [facility] and  deemed to be a poor candidate (apparently compliance issues and lives alone).  He has 2 sons in [place]. He is considering moving to [place] to live with one of his sons after discharge.  If he does this (no longer living alone), he could be considered for transplant or LVAD.     - Ambulate today.   2. Atrial flutter: S/P AFL ablation 3/25. In NSR (sinus tachy).  Amio was stopped.  - Continue Eliquis 5 mg bid.   3. AKI on CKD: Renal function improved, suspect now at baseline.  4. CAD: h/o PCI.  Slight troponin elevation, suspect demand ischemia from CHF.   Consult case manager for home milrinone. Possible discharge tomorrow.   Loralie Champagne    01/11/2015 7:42 AM

## 2015-01-12 ENCOUNTER — Encounter (HOSPITAL_COMMUNITY): Payer: Self-pay | Admitting: Student

## 2015-01-12 ENCOUNTER — Telehealth: Payer: Self-pay | Admitting: Cardiology

## 2015-01-12 LAB — GLUCOSE, CAPILLARY
GLUCOSE-CAPILLARY: 160 mg/dL — AB (ref 70–99)
GLUCOSE-CAPILLARY: 211 mg/dL — AB (ref 70–99)
Glucose-Capillary: 201 mg/dL — ABNORMAL HIGH (ref 70–99)
Glucose-Capillary: 180 mg/dL — ABNORMAL HIGH (ref 70–99)

## 2015-01-12 LAB — CARBOXYHEMOGLOBIN
CARBOXYHEMOGLOBIN: 2.7 % — AB (ref 0.5–1.5)
Carboxyhemoglobin: 1.9 % — ABNORMAL HIGH (ref 0.5–1.5)
METHEMOGLOBIN: 1 % (ref 0.0–1.5)
Methemoglobin: 0.9 % (ref 0.0–1.5)
O2 Saturation: 44.7 %
O2 Saturation: 65.3 %
TOTAL HEMOGLOBIN: 11.2 g/dL — AB (ref 13.5–18.0)
Total hemoglobin: 12.6 g/dL — ABNORMAL LOW (ref 13.5–18.0)

## 2015-01-12 LAB — BASIC METABOLIC PANEL
Anion gap: 7 (ref 5–15)
BUN: 18 mg/dL (ref 6–23)
CALCIUM: 8.7 mg/dL (ref 8.4–10.5)
CO2: 30 mmol/L (ref 19–32)
CREATININE: 1.39 mg/dL — AB (ref 0.50–1.35)
Chloride: 98 mmol/L (ref 96–112)
GFR calc non Af Amer: 54 mL/min — ABNORMAL LOW (ref >90)
GFR, EST AFRICAN AMERICAN: 63 mL/min — AB (ref >90)
Glucose, Bld: 239 mg/dL — ABNORMAL HIGH (ref 70–99)
Potassium: 3.9 mmol/L (ref 3.5–5.1)
Sodium: 135 mmol/L (ref 135–145)

## 2015-01-12 LAB — CBC
HCT: 36.9 % — ABNORMAL LOW (ref 39.0–52.0)
Hemoglobin: 11.7 g/dL — ABNORMAL LOW (ref 13.0–17.0)
MCH: 26.7 pg (ref 26.0–34.0)
MCHC: 31.7 g/dL (ref 30.0–36.0)
MCV: 84.1 fL (ref 78.0–100.0)
PLATELETS: 194 10*3/uL (ref 150–400)
RBC: 4.39 MIL/uL (ref 4.22–5.81)
RDW: 15.1 % (ref 11.5–15.5)
WBC: 10.5 10*3/uL (ref 4.0–10.5)

## 2015-01-12 LAB — DIGOXIN LEVEL: Digoxin Level: 0.4 ng/mL — ABNORMAL LOW (ref 0.8–2.0)

## 2015-01-12 MED ORDER — ISOSORBIDE MONONITRATE ER 30 MG PO TB24: 30.0000 mg | ORAL_TABLET | Freq: Every day | ORAL | Status: DC

## 2015-01-12 MED ORDER — POTASSIUM CHLORIDE CRYS ER 20 MEQ PO TBCR: 40.0000 meq | EXTENDED_RELEASE_TABLET | Freq: Two times a day (BID) | ORAL | Status: DC

## 2015-01-12 MED ORDER — APIXABAN 5 MG PO TABS: 5.0000 mg | ORAL_TABLET | Freq: Two times a day (BID) | ORAL | Status: DC

## 2015-01-12 MED ORDER — PANTOPRAZOLE SODIUM 40 MG PO TBEC: 40.0000 mg | DELAYED_RELEASE_TABLET | Freq: Every day | ORAL | Status: DC

## 2015-01-12 MED ORDER — TORSEMIDE 20 MG PO TABS: 40.0000 mg | ORAL_TABLET | Freq: Two times a day (BID) | ORAL | Status: DC

## 2015-01-12 MED ORDER — COLCHICINE 0.6 MG PO TABS: 0.6000 mg | ORAL_TABLET | Freq: Every day | ORAL | Status: DC

## 2015-01-12 MED ORDER — MILRINONE IN DEXTROSE 20 MG/100ML IV SOLN: 0.3750 ug/kg/min | INTRAVENOUS | Status: DC

## 2015-01-12 MED ORDER — HYDRALAZINE HCL 25 MG PO TABS: 12.5000 mg | ORAL_TABLET | Freq: Three times a day (TID) | ORAL | Status: DC

## 2015-01-12 MED ORDER — SPIRONOLACTONE 25 MG PO TABS: 25.0000 mg | ORAL_TABLET | Freq: Every day | ORAL | Status: DC

## 2015-01-12 MED ORDER — INSULIN GLARGINE 100 UNIT/ML ~~LOC~~ SOLN: 40.0000 [IU] | Freq: Every day | SUBCUTANEOUS | Status: DC

## 2015-01-12 MED ORDER — DIGOXIN 125 MCG PO TABS: 0.1250 mg | ORAL_TABLET | Freq: Every day | ORAL | Status: AC

## 2015-01-12 MED ORDER — HEPARIN SOD (PORK) LOCK FLUSH 100 UNIT/ML IV SOLN
250.0000 [IU] | INTRAVENOUS | Status: AC | PRN
  Administered 2015-01-12: 250 [IU]

## 2015-01-12 MED ORDER — DIGOXIN 125 MCG PO TABS: 0.1250 mg | ORAL_TABLET | Freq: Every day | ORAL | Status: DC

## 2015-01-12 MED ORDER — PANTOPRAZOLE SODIUM 40 MG PO TBEC: 40.0000 mg | DELAYED_RELEASE_TABLET | Freq: Every day | ORAL | Status: AC

## 2015-01-12 MED ORDER — SPIRONOLACTONE 25 MG PO TABS: 25.0000 mg | ORAL_TABLET | Freq: Every day | ORAL | Status: AC

## 2015-01-12 MED ORDER — TORSEMIDE 20 MG PO TABS
40.0000 mg | ORAL_TABLET | Freq: Two times a day (BID) | ORAL | Status: DC
  Administered 2015-01-12 (×2): 40 mg via ORAL
  Filled 2015-01-12 (×3): qty 2

## 2015-01-12 MED ORDER — DIGOXIN 125 MCG PO TABS
0.1250 mg | ORAL_TABLET | Freq: Every day | ORAL | Status: DC
Start: 2015-01-12 — End: 2015-01-12

## 2015-01-12 NOTE — Discharge Summary (Signed)
CARDIOLOGY DISCHARGE SUMMARY   Patient ID: Daniel Blankenship MRN: [id number] DOB/AGE: 60-Nov-1957 60 y.o.  Admit date: 12/31/2014 Discharge date: 01/12/2015  PCP: No primary care provider on file. [facility] w[facility] clinic  Primary Discharge Diagnosis: Acute on chronic systolic and diastolic heart failure, NYHA class 4 - discharge weight 274 pounds  Secondary Discharge Diagnosis:    Acute respiratory failure    Atrial flutter with rapid ventricular response   DM type 2, uncontrolled, with renal complications   Hyperglycemia   CKD (chronic kidney disease), stage III   Obesity (BMI 30-39.9)   Essential hypertension   Acute liver failure   Abdominal pain   Acute pulmonary edema   Hypoxemia   Hypokalemia   Gout  Consults: CCM. EP, Gastroenterology, Cardiology, internal medicine  Procedures: intubation and mechanical ventilation, CTA chest, serial CXRs, CT abdomen/pelvis, abd US, 2D echocardiogram, transesophageal echocardiogram, Comprehensive EP study, Coronary sinus pacing and recording, Mapping of supraventricular tachycardia, Radiofrequency ablation of supraventricular tachycardia, ICD interrogation and reprogramming, PICC line placement   Hospital Course: Daniel Blankenship is a 59 y.o. male with past medical history of systolic heart failure with cardiomyopathy status post [name], diabetes mellitus with secondary renal disease and medical noncompliance who was admitted on 3/20 with complaints of leg swelling, abdominal pain and shortness of breath. He was found to be markedly hyperglycemic and in acute systolic heart failure.  1. Acute on chronic systolic and diastolic CHF/acute respiratory failure: He was diuresed with IV Lasix and lost 19 pounds during his hospital stay, discharge weight 274 pounds. He was initially started on Lasix 40 mg IV twice a day as well as IV milrinone. Central line was placed to monitor CVP: His Lasix was increased to 3  times a day, but he ended up with a Lasix drip at 10 mg per hour. Metolazone was used at times to supplement his diuresis. He had spironolactone and hydralazine as well as Imdur added to his medication regimen once his volume status improved somewhat. His medications continue to be adjusted and his volume status was followed during his hospital stay.   Discharge medications are below. He is to remain on the milrinone and a PICC line was placed to facilitate this. He is on torsemide, K Dur, digoxin, hydralazine, nitrates, and spironolactone. He is not on a beta blocker because of the low output state. No ACE inhibitor or ARB with low blood pressure and acute kidney injury. Compliance is encouraged. If he is compliant in follow-up and in medications, he may be considered for advanced therapies. He is thinking of going to [place] to live with his son. He does this and is no longer living alone, he could possibly be considered for transplant or LVAD. [facility] he was previously deemed not a candidate due to compliance and social issues.  2. Intubation and mechanical ventilation: He was in atrial flutter on admission and ablation was recommended. Because of his respiratory status, it was felt that he should be intubated for the ablation. This was performed by anesthesia on 01/05/2015 heart at the ablation. After the procedure, there was a delay in waking up and his oxygen saturation was low. He was reintubated and a CCM consult was called. After he arrived in CCU, he became more awake and self extubated. He was followed by CCM and respiratory therapy until his respiratory status had stabilized.  3. Atrial flutter: He was in atrial flutter on admission, generally with  rapid ventricular response. He was started on IV amiodarone and was on digoxin. Cardizem was not used because of his low EF. He was not on a beta blocker because of his low output state. He was started on IV amiodarone prior to the ablation in  order to control his rate. The amiodarone was discontinued after the ablation. He is still on the digoxin. He is on Eliquis for anticoagulation.  4. Diabetes type 2: He has a history of uncontrolled diabetes. His blood sugar on admission was over 400. Hemoglobin A1c was checked and was 12.9. He has a history of poor diet and medication compliance. He is currently on Lantus 40 units daily at bedtime. He is to continue this and encouraged to be compliant with a diabetic diet. He is to follow-up with his primary care physician at the [facility] for this.  5. CKD stage 3: Ms. BUN and creatinine on admission were 52/2.13. This was followed closely during his hospital stay. His BUN and creatinine improved during his hospital stay, discharge labs are below. His GFR is currently 63.  6. Acute liver failure and abdominal pain: He was seen by Dr. Jeannetta Ellis on 03/23 for severe lower abdominal pain and abnormal LFTs. CT Scan showed hepatic steatosis but no acute changes. His LFTs had been abnormal with a bilirubin of 5.4 on admission plus elevation in his transaminases. An ultrasound showed gallbladder sludge but a normal common bile duct. There was concern that he had transient: Ischemia but Dr. Jeannetta Ellis did not feel that he had acute gallbladder disease. Passive congestion related to heart failure as well as possible reactive up hepatopathy was felt more likely. No invasive procedures were recommended. The patient's abdominal pain resolved. His bilirubin improved during his hospital stay as did his transaminases. Discharge labs are below.  7. Hypokalemia: His electrolytes were followed closely during his hospital stay. His potassium decreased with diuresis, as low as 2.9. It was supplemented and is normal at discharge.  8. Gout: He developed gout in his left hand, and was started on colchicine. His symptoms improved with this and no further treatment is indicated. He is to continue colchicine at discharge with further management  per the [place].  He was seen by cardiac rehabilitation and ambulated with them on a regular basis. He was using a walker and at first required two-person assist. However, his ability to ambulate gradually improved. By discharge, he was able to walk with a rolling walker approximately 100 feet, and did not complain of shortness of breath.  On 01/12/2015, he was seen by Dr. Shirlee Latch and all data were reviewed. He had been on Lipitor 80 mg during his hospital stay but the patient prefers to remain on his home dose of 40 mg at discharge.   Labs:   Lab Results  Component Value Date   WBC 10.5 01/12/2015   HGB 11.7* 01/12/2015   HCT 36.9* 01/12/2015   MCV 84.1 01/12/2015   PLT 194 01/12/2015     Recent Labs Lab 01/11/15 0440 01/12/15 0345  NA 137 135  K 3.8 3.9  CL 99 98  CO2 29 30  BUN 14 18  CREATININE 1.35 1.39*  CALCIUM 8.7 8.7  PROT 6.1  --   BILITOT 2.3*  --   ALKPHOS 97  --   ALT 46  --   AST 23  --   GLUCOSE 185* 239*   B NATRIURETIC PEPTIDE  Date/Time Value Ref Range Status  12/31/2014 02:30 PM 386.4* 0.0 - 100.0  pg/mL Final     Lab Results  Component Value Date   HGBA1C 12.9* 12/31/2014    Radiology: Ct Abdomen Pelvis Wo Contrast 01/03/2015   CLINICAL DATA:  Abdominal pain, elevated LFTs and liver failure. Nausea.  EXAM: CT ABDOMEN AND PELVIS WITHOUT CONTRAST  TECHNIQUE: Multidetector CT imaging of the abdomen and pelvis was performed following the standard protocol without IV contrast.  COMPARISON:  Abdominal ultrasound 01/01/2015  FINDINGS: Small right pleural effusion. The heart is enlarged. There is bibasilar atelectasis in the lower lobes.  Liver is prominent in size with decreased density, suspect steatosis. There is decreased density adjacent to the falciform ligament that may reflect more focal steatosis. The questioned lesion on ultrasound is not well seen, detailed evaluation limited given lack of intravenous contrast. The gallbladder is physiologically distended.  Probable sludge in the gallbladder lumen. No pericholecystic fluid. Common bile duct is not well visualized.  Spleen is normal in size. Pancreas and adrenal glands are normal. There is no hydronephrosis or perinephric stranding. No renal stones. Suspect 1.3 cm cyst in the lower right kidney.  Stomach is decompressed. There are no dilated or thickened bowel loops. The appendix is not definitively identified. Small volume of stool throughout the colon. There is oral contrast throughout the colon.  The abdominal aorta is normal in caliber, densely calcified. No retroperitoneal adenopathy. No free air, free fluid, or intra-abdominal fluid collection. No ascites. There is a small to moderate fat containing umbilical hernia.  Within the pelvis the bladder is minimally distended. Prostate gland is normal in size. No pelvic free fluid or ascites. No pelvic adenopathy.  There is degenerative change in the lower lumbar spine. No acute osseous abnormality or focal osseous lesion.  IMPRESSION: 1. Decreased hepatic density, suspect hepatic steatosis. There is more focal fatty infiltration adjacent to the falciform ligament of the liver. The lesion on ultrasound is not seen without IV contrast, characterization recommended with MRI. 2. Probable sludge in the gallbladder. 3. Small right pleural effusion, new from prior exam. 4. Fat containing umbilical hernia.   Electronically Signed   By: Rubye Oaks M.D.   On: 01/03/2015 05:40   Ct Angio Chest Pe W/cm &/or Wo Cm 12/31/2014   CLINICAL DATA:  Shortness of breath. Abdominal pain. Productive cough.  EXAM: CT ANGIOGRAPHY CHEST WITH CONTRAST  TECHNIQUE: Multidetector CT imaging of the chest was performed using the standard protocol during bolus administration of intravenous contrast. Multiplanar CT image reconstructions and MIPs were obtained to evaluate the vascular anatomy.  CONTRAST:  OMNIPAQUE IOHEXOL 350 MG/ML SOLN  COMPARISON:  Chest x-ray dated 12/31/2014   FINDINGS: There are no pulmonary emboli or infiltrates or effusions. There is a 5 mm peripheral nodule in the right lower lobe on image 58 of series 6. The patient has mediastinal and hilar adenopathy. There is a 13 mm node just to the left of the aortic arch on image 29, and azygos node measuring 13 mm on image 33, and a prominent nodal mass measuring 6.6 x 2.7 x 4.8 cm in the subcarinal region. Part of the soft tissue density represents the esophagus but I think of the scar this is adenopathy.  There is cardiomegaly with dilatation of the left ventricle.  No osseous abnormality. The visualized portion of the upper abdomen is normal.  Review of the MIP images confirms the above findings.  IMPRESSION: 1. No pulmonary emboli. 2. Cardiomegaly with dilatation of the right ventricle. 3. Mediastinal and hilar adenopathy of unknown etiology. The  possibility of malignancy should be considered. 4. 5 mm nodule in the periphery of the right lower lobe. This should be followed up in 6 months with chest CT without contrast if the adenopathy is not determined to be malignant.   Electronically Signed   By: Francene Boyers M.D.   On: 12/31/2014 17:33   Portable Chest Xray 01/06/2015   CLINICAL DATA:  Acute respiratory failure  EXAM: PORTABLE CHEST - 1 VIEW  COMPARISON:  January 05, 2015  FINDINGS: Central catheter tip is in the superior vena cava. There is a pacemaker present with leads attached to the right ventricle. No pneumothorax. There is stable cardiomegaly with mild pulmonary venous hypertension. There is airspace consolidation the left base. There are small layering effusions. No new opacity.  IMPRESSION: Findings consistent with a degree of congestive heart failure. No new opacity. No pneumothorax.   Electronically Signed   By: Bretta Bang III M.D.   On: 01/06/2015 07:51   Portable Chest Xray 01/05/2015   CLINICAL DATA:  Acute respiratory failure  EXAM: PORTABLE CHEST - 1 VIEW  COMPARISON:  January 02, 2015   FINDINGS: There are pleural effusions bilaterally with cardiomegaly and mild pulmonary venous hypertension. There is mild airspace consolidation in the left base, stable. There is no new opacity. Central catheter tip is in the superior vena cava. Pacemaker lead is attached to the right ventricle. No pneumothorax.  IMPRESSION: Evidence of a degree of congestive heart failure. Question atelectasis or pneumonia superimposed in the left base. No new opacity compared to recent prior study.   Electronically Signed   By: Bretta Bang III M.D.   On: 01/05/2015 18:10   Dg Chest Port 1 View 01/02/2015   CLINICAL DATA:  Left central line insertion  EXAM: PORTABLE CHEST - 1 VIEW  COMPARISON:  12/31/2014  FINDINGS: New left IJ catheter, tip at the SVC level. No interval displacement of the right sided pacer/ICD.  Increased hazy density of the lower chest is likely from soft tissue attenuation. There is ongoing pulmonary venous congestion. No pneumothorax.  IMPRESSION: 1. New left IJ catheter is in good position.  No pneumothorax. 2. Unchanged cardiomegaly and pulmonary venous congestion.   Electronically Signed   By: Marnee Spring M.D.   On: 01/02/2015 19:54   Dg Chest Port 1 View 12/31/2014   CLINICAL DATA:  Shortness of breath and chest pain  EXAM: PORTABLE CHEST - 1 VIEW  COMPARISON:  None.  FINDINGS: Cardiac shadow is enlarged. A defibrillator is noted. No focal infiltrate or sizable effusion is seen.  IMPRESSION: No active disease.   Electronically Signed   By: Alcide Clever M.D.   On: 12/31/2014 14:14   US Abdomen Limited Ruq 01/01/2015   CLINICAL DATA:  Abnormal LFTs  EXAM: US ABDOMEN LIMITED - RIGHT UPPER QUADRANT  COMPARISON:  None.  FINDINGS: Gallbladder:  There is intraluminal sludge. Gallbladder wall thickness is normal, 2.0 mm. The patient was not tender over the gallbladder.  Common bile duct:  Diameter: 5.2 mm, normal  Liver:  The liver is enlarged, over 20 cm craniocaudal. It is diffusely increased in  echogenicity consistent with fatty infiltration. There is a 2.5 cm focal hyperechoic homogeneous circumscribed lesion which likely represents a benign cavernous hemangioma.  IMPRESSION: 1. Enlarged liver with abnormally increased echogenicity. Fatty infiltration or other liver disease can produce this appearance. 2. 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 MRI with contrast  would conclusively characterize this abnormality.   Electronically Signed   By: Ellery Plunk M.D.   On: 01/01/2015 06:05   EKG: 01/09/2015 Sinus Tach Vent. rate 102 BPM PR interval 208 ms QRS duration 122 ms QT/QTc 368/479 ms P-R-T axes 63 -40 94  TEE: 01/05/2015 Conclusions - Left ventricle: The cavity size was moderately dilated. Wall thickness was normal. The estimated ejection fraction was 15%.Diffuse hypokinesis. No LV thrombus. - Aortic valve: There was no stenosis. - Mitral valve: There was moderate mitral regurgitation with ERO0.24 cm^2 (likely from annular dilatation). - Left atrium: The atrium was mildly to moderately dilated. No evidence of thrombus in the atrial cavity or appendage. - Right ventricle: The cavity size was moderately dilated. Systolicfunction was severely reduced. - Right atrium: The atrium was mildly to moderately dilated. - Atrial septum: No defect or patent foramen ovale was identified.Echo contrast study showed no right-to-left atrial level shunt,at baseline or with provocation. - Tricuspid valve: There was mild-moderate regurgitation. PeakRV-RA gradient (S): 24 mm Hg. Impressions: - May proceed to atrial flutter ablation.  Echo: 01/02/2015 Conclusions - Left ventricle: The cavity size was severely dilated. Wall thickness was normal. The estimated ejection fraction was 15%. Diffuse hypokinesis. There is severe hypokinesis of the entireanteroseptal myocardium. Due to  tachycardia, there was fusion of early and atrial contributions to ventricular filling. Doppler parameters are consistent with high ventricular filling pressure. - Mitral valve: There was mild regurgitation. - Left atrium: The atrium was moderately dilated. - Tricuspid valve: There was moderate regurgitation. - Pulmonary arteries: PA peak pressure: 49 mm Hg (S). - Pericardium, extracardiac: A trivial pericardial effusion was identified.  FOLLOW UP PLANS AND APPOINTMENTS Allergies  Allergen Reactions  . Sulfa Antibiotics   . Zocor [Simvastatin]   . Lisinopril Rash     Medication List    STOP taking these medications        nitroGLYCERIN 0.4 mg/hr patch  Commonly known as:  NITRODUR - Dosed in mg/24 hr      TAKE these medications        ALPRAZolam 0.5 MG dissolvable tablet  Commonly known as:  NIRAVAM  Take one tablet by mouth three times daily after meals. Hold for sedation.     apixaban 5 MG Tabs tablet  Commonly known as:  ELIQUIS  Take 1 tablet (5 mg total) by mouth 2 (two) times daily.     aspirin 81 MG tablet  Take 81 mg by mouth daily.     atorvastatin 80 MG tablet  Commonly known as:  LIPITOR  Take 40 mg by mouth daily.     colchicine 0.6 MG tablet  Take 1 tablet (0.6 mg total) by mouth daily.     digoxin 0.125 MG tablet  Commonly known as:  LANOXIN  Take 1 tablet (0.125 mg total) by mouth daily.     docusate sodium 100 MG capsule  Commonly known as:  COLACE  Take 200 mg by mouth daily.     hydrALAZINE 25 MG tablet  Commonly known as:  APRESOLINE  Take 0.5 tablets (12.5 mg total) by mouth every 8 (eight) hours.     insulin glargine 100 UNIT/ML injection  Commonly known as:  LANTUS  Inject 0.4 mLs (40 Units total) into the skin at bedtime.     isosorbide mononitrate 30 MG 24 hr tablet  Commonly known as:  IMDUR  Take 1 tablet (30 mg total) by mouth daily.     metoprolol tartrate 25 MG tablet  Commonly  known as:  LOPRESSOR  Take 25 mg  by mouth every 6 (six) hours.     milrinone 20 MG/100ML Soln infusion  Commonly known as:  PRIMACOR  Inject 50.0625 mcg/min into the vein continuous.     oxyCODONE-acetaminophen 5-325 MG per tablet  Commonly known as:  ROXICET  Take 1 tablet by mouth every 6 (six) hours as needed for pain.     pantoprazole 40 MG tablet  Commonly known as:  PROTONIX  Take 1 tablet (40 mg total) by mouth daily.     potassium chloride SA 20 MEQ tablet  Commonly known as:  K-DUR,KLOR-CON  Take 2 tablets (40 mEq total) by mouth 2 (two) times daily.     spironolactone 25 MG tablet  Commonly known as:  ALDACTONE  Take 1 tablet (25 mg total) by mouth daily.     torsemide 20 MG tablet  Commonly known as:  DEMADEX  Take 2 tablets (40 mg total) by mouth 2 (two) times daily.        Discharge Instructions    (HEART FAILURE PATIENTS) Call MD:  Anytime you have any of the following symptoms: 1) 3 pound weight gain in 24 hours or 5 pounds in 1 week 2) shortness of breath, with or without a dry hacking cough 3) swelling in the hands, feet or stomach 4) if you have to sleep on extra pillows at night in order to breathe.    Complete by:  As directed      Diet - low sodium heart healthy    Complete by:  As directed      Diet Carb Modified    Complete by:  As directed      Increase activity slowly    Complete by:  As directed           Follow-up Information    Follow up with Arvilla Meres, MD On 01/24/2015.   Specialty:  Cardiology   Why:  at 3:40 pm in the [facility]--gate code 0007--please bring all medications to appt.   Contact information:   [address] [place] [address] [phone]       BRING ALL MEDICATIONS WITH YOU TO FOLLOW UP APPOINTMENTS  Time spent with patient to include physician time: 65 min Signed: Theodore Demark, PA-C 01/12/2015, 4:11 PM Co-Sign MD

## 2015-01-12 NOTE — Progress Notes (Signed)
1125 Offered to walk with pt but he declined. Stated he was going home and had been walking back and forth to bathroom and wanted to save his energy for discharge. Told pt cardiologist wrote in note he wanted him to walk and it would help determine if ready for home. Again declined nicely. Thanked me for coming to see him and walking with him earlier in week. Luetta NuttingCharlene Cielle Aguila RN BSN 01/12/2015 11:26 AM

## 2015-01-12 NOTE — Progress Notes (Signed)
Prior to shift change, [facility]. [facility]. RN provided pt with the Eliquis pamphlet to try to decrease the out of pocket expense. [facility] Then pts friend called RN and said the total bill was about $530 and nearly $400 of this amount was the Lantus. At 0845 RN paged MD Duke Salviaandolph to ask about pt medications prior to discharge. MD Duke Salviaandolph told RN to administer the PM dose of 40 units of Lantus prior to pts discharge. MD Duke Salviaandolph also wrote a Care Order Instruction order. RN gave Lantus at 2102. [facility] of [facility]us.

## 2015-01-12 NOTE — Telephone Encounter (Signed)
Per Dr Darvin NeighboursMcLean--he will be following this patient in [facility] so paperwork can be dropped off there.  LMTCB for [place].

## 2015-01-12 NOTE — Telephone Encounter (Signed)
New Message   Rn from CCU called to help facilitate pt's FMLA papers. Per pt daughter- was told by Dr. Shirlee LatchMclean to contact office and wanted to know what to do going forward w/ FMLA papers. Please call back and discuss.

## 2015-01-12 NOTE — Progress Notes (Addendum)
Patient ID: Daniel Blankenship, male   DOB: June 30, 1956, 59 y.o.   MRN: [id number]   SUBJECTIVE:   Underwent successful atrial flutter ablation on 3/25.  He was lethargic post-extubation so was re-intubated.  He then woke up and self-extubated.  Amiodarone stopped. Maintaining NSR.   Milrinone was cut back to 0.125 mcg but co-ox went down and milrinone was increased back to 0.25 mcg. Co-ox improved.  Back to IV Lasix + metolazone last 2 days due to volume overload.  He diuresed well again yesterday.  CVP 5-7 this morning. Did well walking yesterday.  Co-ox was 55% 3/31 and 44% 4/1.    Patient has gout left hand, improved with colchicine.    Creatinine 1.9>1.4 > 1.35 > 1.26 > 1.35 > 1.39  Scheduled Meds: . apixaban  5 mg Oral BID  . atorvastatin  80 mg Oral q1800  . colchicine  0.6 mg Oral Daily  . digoxin  0.125 mg Oral Daily  . docusate sodium  200 mg Oral Daily  . hydrALAZINE  12.5 mg Oral 3 times per day  . insulin aspart  0-20 Units Subcutaneous TID WC  . insulin aspart  0-5 Units Subcutaneous QHS  . insulin glargine  40 Units Subcutaneous QHS  . isosorbide mononitrate  30 mg Oral Daily  . off the beat book   Does not apply Once  . pantoprazole  40 mg Oral Daily  . potassium chloride  40 mEq Oral BID  . sodium chloride  10-40 mL Intracatheter Q12H  . sodium chloride  10-40 mL Intracatheter Q12H  . sodium chloride  3 mL Intravenous Q12H  . spironolactone  25 mg Oral Daily  . torsemide  40 mg Oral BID   Continuous Infusions: . milrinone 0.25 mcg/kg/min (01/12/15 0218)   PRN Meds:.sodium chloride, acetaminophen, ALPRAZolam, ondansetron (ZOFRAN) IV, oxyCODONE-acetaminophen, promethazine, sodium chloride, sodium chloride, sodium chloride    Filed Vitals:   01/11/15 2035 01/11/15 2341 01/12/15 0341 01/12/15 0500  BP:  110/86 115/66   Pulse:  115 106   Temp: 98.5 F (36.9 C) 97.4 F (36.3 C) 98.2 F (36.8 C)   TempSrc: Oral Oral Oral   Resp:  20 22   Height:      Weight:     274 lb 4 oz (124.4 kg)  SpO2:  97% 94%     Intake/Output Summary (Last 24 hours) at 01/12/15 0739 Last data filed at 01/12/15 0600  Gross per 24 hour  Intake   1360 ml  Output   4700 ml  Net  -3340 ml    LABS: Basic Metabolic Panel:  Recent Labs  01/11/15 0440 01/12/15 0345  NA 137 135  K 3.8 3.9  CL 99 98  CO2 29 30  GLUCOSE 185* 239*  BUN 14 18  CREATININE 1.35 1.39*  CALCIUM 8.7 8.7   Liver Function Tests:  Recent Labs  01/11/15 0440  AST 23  ALT 46  ALKPHOS 97  BILITOT 2.3*  PROT 6.1  ALBUMIN 2.7*   No results for input(s): LIPASE, AMYLASE in the last 72 hours. CBC:  Recent Labs  01/11/15 0440 01/12/15 0345  WBC 10.6* 10.5  HGB 11.7* 11.7*  HCT 37.1* 36.9*  MCV 84.7 84.1  PLT 179 194   Cardiac Enzymes: No results for input(s): CKTOTAL, CKMB, CKMBINDEX, TROPONINI in the last 72 hours. BNP: Invalid input(s): POCBNP D-Dimer: No results for input(s): DDIMER in the last 72 hours. Hemoglobin A1C: No results for input(s): HGBA1C in the last  72 hours. Fasting Lipid Panel: No results for input(s): CHOL, HDL, LDLCALC, TRIG, CHOLHDL, LDLDIRECT in the last 72 hours. Thyroid Function Tests: No results for input(s): TSH, T4TOTAL, T3FREE, THYROIDAB in the last 72 hours.  Invalid input(s): FREET3 Anemia Panel:  Recent Labs  01/09/15 0900  VITAMINB12 1970*  FOLATE 16.0  FERRITIN 184  TIBC 334  IRON 46  RETICCTPCT 2.5    RADIOLOGY: Ct Abdomen Pelvis Wo Contrast  01/03/2015   CLINICAL DATA:  Abdominal pain, elevated LFTs and liver failure. Nausea.  EXAM: CT ABDOMEN AND PELVIS WITHOUT CONTRAST  TECHNIQUE: Multidetector CT imaging of the abdomen and pelvis was performed following the standard protocol without IV contrast.  COMPARISON:  Abdominal ultrasound 01/01/2015  FINDINGS: Small right pleural effusion. The heart is enlarged. There is bibasilar atelectasis in the lower lobes.  Liver is prominent in size with decreased density, suspect steatosis.  There is decreased density adjacent to the falciform ligament that may reflect more focal steatosis. The questioned lesion on ultrasound is not well seen, detailed evaluation limited given lack of intravenous contrast. The gallbladder is physiologically distended. Probable sludge in the gallbladder lumen. No pericholecystic fluid. Common bile duct is not well visualized.  Spleen is normal in size. Pancreas and adrenal glands are normal. There is no hydronephrosis or perinephric stranding. No renal stones. Suspect 1.3 cm cyst in the lower right kidney.  Stomach is decompressed. There are no dilated or thickened bowel loops. The appendix is not definitively identified. Small volume of stool throughout the colon. There is oral contrast throughout the colon.  The abdominal aorta is normal in caliber, densely calcified. No retroperitoneal adenopathy. No free air, free fluid, or intra-abdominal fluid collection. No ascites. There is a small to moderate fat containing umbilical hernia.  Within the pelvis the bladder is minimally distended. Prostate gland is normal in size. No pelvic free fluid or ascites. No pelvic adenopathy.  There is degenerative change in the lower lumbar spine. No acute osseous abnormality or focal osseous lesion.  IMPRESSION: 1. Decreased hepatic density, suspect hepatic steatosis. There is more focal fatty infiltration adjacent to the falciform ligament of the liver. The lesion on ultrasound is not seen without IV contrast, characterization recommended with MRI. 2. Probable sludge in the gallbladder. 3. Small right pleural effusion, new from prior exam. 4. Fat containing umbilical hernia.   Electronically Signed   By: Jeb Levering M.D.   On: 01/03/2015 05:40   Ct Angio Chest Pe W/cm &/or Wo Cm  12/31/2014   CLINICAL DATA:  Shortness of breath. Abdominal pain. Productive cough.  EXAM: CT ANGIOGRAPHY CHEST WITH CONTRAST  TECHNIQUE: Multidetector CT imaging of the chest was performed using the  standard protocol during bolus administration of intravenous contrast. Multiplanar CT image reconstructions and MIPs were obtained to evaluate the vascular anatomy.  CONTRAST:  198m OMNIPAQUE IOHEXOL 350 MG/ML SOLN  COMPARISON:  Chest x-ray dated 12/31/2014  FINDINGS: There are no pulmonary emboli or infiltrates or effusions. There is a 5 mm peripheral nodule in the right lower lobe on image 58 of series 6. The patient has mediastinal and hilar adenopathy. There is a 13 mm node just to the left of the aortic arch on image 29, and azygos node measuring 13 mm on image 33, and a prominent nodal mass measuring 6.6 x 2.7 x 4.8 cm in the subcarinal region. Part of the soft tissue density represents the esophagus but I think of the scar this is adenopathy.  There is cardiomegaly with  dilatation of the left ventricle.  No osseous abnormality. The visualized portion of the upper abdomen is normal.  Review of the MIP images confirms the above findings.  IMPRESSION: 1. No pulmonary emboli. 2. Cardiomegaly with dilatation of the right ventricle. 3. Mediastinal and hilar adenopathy of unknown etiology. The possibility of malignancy should be considered. 4. 5 mm nodule in the periphery of the right lower lobe. This should be followed up in 6 months with chest CT without contrast if the adenopathy is not determined to be malignant.   Electronically Signed   By: Lorriane Shire M.D.   On: 12/31/2014 17:33   Portable Chest Xray  01/06/2015   CLINICAL DATA:  Acute respiratory failure  EXAM: PORTABLE CHEST - 1 VIEW  COMPARISON:  January 05, 2015  FINDINGS: Central catheter tip is in the superior vena cava. There is a pacemaker present with leads attached to the right ventricle. No pneumothorax. There is stable cardiomegaly with mild pulmonary venous hypertension. There is airspace consolidation the left base. There are small layering effusions. No new opacity.  IMPRESSION: Findings consistent with a degree of congestive heart  failure. No new opacity. No pneumothorax.   Electronically Signed   By: Lowella Grip III M.D.   On: 01/06/2015 07:51   Portable Chest Xray  01/05/2015   CLINICAL DATA:  Acute respiratory failure  EXAM: PORTABLE CHEST - 1 VIEW  COMPARISON:  January 02, 2015  FINDINGS: There are pleural effusions bilaterally with cardiomegaly and mild pulmonary venous hypertension. There is mild airspace consolidation in the left base, stable. There is no new opacity. Central catheter tip is in the superior vena cava. Pacemaker lead is attached to the right ventricle. No pneumothorax.  IMPRESSION: Evidence of a degree of congestive heart failure. Question atelectasis or pneumonia superimposed in the left base. No new opacity compared to recent prior study.   Electronically Signed   By: Lowella Grip III M.D.   On: 01/05/2015 18:10   Dg Chest Port 1 View  01/02/2015   CLINICAL DATA:  Left central line insertion  EXAM: PORTABLE CHEST - 1 VIEW  COMPARISON:  12/31/2014  FINDINGS: New left IJ catheter, tip at the SVC level. No interval displacement of the right sided pacer/ICD.  Increased hazy density of the lower chest is likely from soft tissue attenuation. There is ongoing pulmonary venous congestion. No pneumothorax.  IMPRESSION: 1. New left IJ catheter is in good position.  No pneumothorax. 2. Unchanged cardiomegaly and pulmonary venous congestion.   Electronically Signed   By: Monte Fantasia M.D.   On: 01/02/2015 19:54   Dg Chest Port 1 View  12/31/2014   CLINICAL DATA:  Shortness of breath and chest pain  EXAM: PORTABLE CHEST - 1 VIEW  COMPARISON:  None.  FINDINGS: Cardiac shadow is enlarged. A defibrillator is noted. No focal infiltrate or sizable effusion is seen.  IMPRESSION: No active disease.   Electronically Signed   By: Inez Catalina M.D.   On: 12/31/2014 14:14   US Abdomen Limited Ruq  01/01/2015   CLINICAL DATA:  Abnormal LFTs  EXAM: US ABDOMEN LIMITED - RIGHT UPPER QUADRANT  COMPARISON:  None.  FINDINGS:  Gallbladder:  There is intraluminal sludge. Gallbladder wall thickness is normal, 2.0 mm. The patient was not tender over the gallbladder.  Common bile duct:  Diameter: 5.2 mm, normal  Liver:  The liver is enlarged, over 20 cm craniocaudal. It is diffusely increased in echogenicity consistent with fatty infiltration. There is a  2.5 cm focal hyperechoic homogeneous circumscribed lesion which likely represents a benign cavernous hemangioma.  IMPRESSION: 1. Enlarged liver with abnormally increased echogenicity. Fatty infiltration or other liver disease can produce this appearance. 2. 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 MRI with contrast would conclusively characterize this abnormality.   Electronically Signed   By: Andreas Newport M.D.   On: 01/01/2015 06:05    PHYSICAL EXAM CVP 5-7 General: NAD Neck: JVP 7, no thyromegaly or thyroid nodule.  Lungs: Slight crackles at bases.  CV: Lateral PMI.  Heart tachy, regular S1/S2, no S3, no murmur.  Trace ankle edema.   Abdomen: Soft, NT  no hepatosplenomegaly, no distention.  Neurologic: Alert and oriented x 3.  Psych: Normal affect. Extremities: No clubbing or cyanosis. Warm/erythema left 2nd MCP.   TELEMETRY: NSR/sinus tach  Results for orders placed or performed during the hospital encounter of 12/31/14 (from the past 48 hour(s))  Glucose, capillary     Status: Abnormal   Collection Time: 01/10/15  7:58 AM  Result Value Ref Range   Glucose-Capillary 118 (H) 70 - 99 mg/dL  Glucose, capillary     Status: Abnormal   Collection Time: 01/10/15  1:11 PM  Result Value Ref Range   Glucose-Capillary 159 (H) 70 - 99 mg/dL  Glucose, capillary     Status: Abnormal   Collection Time: 01/10/15  4:54 PM  Result Value Ref Range   Glucose-Capillary 190 (H) 70 - 99 mg/dL   Comment 1 Capillary Specimen   Glucose, capillary     Status: Abnormal   Collection Time:  01/10/15  8:59 PM  Result Value Ref Range   Glucose-Capillary 241 (H) 70 - 99 mg/dL   Comment 1 Capillary Specimen   CBC     Status: Abnormal   Collection Time: 01/11/15  4:40 AM  Result Value Ref Range   WBC 10.6 (H) 4.0 - 10.5 K/uL   RBC 4.38 4.22 - 5.81 MIL/uL   Hemoglobin 11.7 (L) 13.0 - 17.0 g/dL   HCT 37.1 (L) 39.0 - 52.0 %   MCV 84.7 78.0 - 100.0 fL   MCH 26.7 26.0 - 34.0 pg   MCHC 31.5 30.0 - 36.0 g/dL   RDW 15.2 11.5 - 15.5 %   Platelets 179 150 - 400 K/uL  Carboxyhemoglobin     Status: Abnormal   Collection Time: 01/11/15  4:40 AM  Result Value Ref Range   Total hemoglobin 11.9 (L) 13.5 - 18.0 g/dL   O2 Saturation 55.3 %   Carboxyhemoglobin 2.1 (H) 0.5 - 1.5 %   Methemoglobin 0.9 0.0 - 1.5 %  Basic metabolic panel     Status: Abnormal   Collection Time: 01/11/15  4:40 AM  Result Value Ref Range   Sodium 137 135 - 145 mmol/L   Potassium 3.8 3.5 - 5.1 mmol/L   Chloride 99 96 - 112 mmol/L   CO2 29 19 - 32 mmol/L   Glucose, Bld 185 (H) 70 - 99 mg/dL   BUN 14 6 - 23 mg/dL   Creatinine, Ser 1.35 0.50 - 1.35 mg/dL   Calcium 8.7 8.4 - 10.5 mg/dL   GFR calc non Af Amer 56 (L) >90 mL/min   GFR calc Af Amer 65 (L) >90 mL/min    Comment: (NOTE) The eGFR has been calculated using the CKD EPI equation. This calculation has not been validated in all clinical situations. eGFR's persistently <90 mL/min signify  possible Chronic Kidney Disease.    Anion gap 9 5 - 15  Hepatic function panel     Status: Abnormal   Collection Time: 01/11/15  4:40 AM  Result Value Ref Range   Total Protein 6.1 6.0 - 8.3 g/dL   Albumin 2.7 (L) 3.5 - 5.2 g/dL   AST 23 0 - 37 U/L   ALT 46 0 - 53 U/L   Alkaline Phosphatase 97 39 - 117 U/L   Total Bilirubin 2.3 (H) 0.3 - 1.2 mg/dL   Bilirubin, Direct 0.6 (H) 0.0 - 0.5 mg/dL   Indirect Bilirubin 1.7 (H) 0.3 - 0.9 mg/dL  Glucose, capillary     Status: Abnormal   Collection Time: 01/11/15  8:05 AM  Result Value Ref Range   Glucose-Capillary 124  (H) 70 - 99 mg/dL   Comment 1 Capillary Specimen   Glucose, capillary     Status: Abnormal   Collection Time: 01/11/15 11:32 AM  Result Value Ref Range   Glucose-Capillary 229 (H) 70 - 99 mg/dL   Comment 1 Capillary Specimen   Glucose, capillary     Status: Abnormal   Collection Time: 01/11/15  4:46 PM  Result Value Ref Range   Glucose-Capillary 179 (H) 70 - 99 mg/dL  Glucose, capillary     Status: Abnormal   Collection Time: 01/11/15 10:02 PM  Result Value Ref Range   Glucose-Capillary 176 (H) 70 - 99 mg/dL   Comment 1 Capillary Specimen   CBC     Status: Abnormal   Collection Time: 01/12/15  3:45 AM  Result Value Ref Range   WBC 10.5 4.0 - 10.5 K/uL   RBC 4.39 4.22 - 5.81 MIL/uL   Hemoglobin 11.7 (L) 13.0 - 17.0 g/dL   HCT 36.9 (L) 39.0 - 52.0 %   MCV 84.1 78.0 - 100.0 fL   MCH 26.7 26.0 - 34.0 pg   MCHC 31.7 30.0 - 36.0 g/dL   RDW 15.1 11.5 - 15.5 %   Platelets 194 150 - 400 K/uL  Basic metabolic panel     Status: Abnormal   Collection Time: 01/12/15  3:45 AM  Result Value Ref Range   Sodium 135 135 - 145 mmol/L   Potassium 3.9 3.5 - 5.1 mmol/L   Chloride 98 96 - 112 mmol/L   CO2 30 19 - 32 mmol/L   Glucose, Bld 239 (H) 70 - 99 mg/dL   BUN 18 6 - 23 mg/dL   Creatinine, Ser 1.39 (H) 0.50 - 1.35 mg/dL   Calcium 8.7 8.4 - 10.5 mg/dL   GFR calc non Af Amer 54 (L) >90 mL/min   GFR calc Af Amer 63 (L) >90 mL/min    Comment: (NOTE) The eGFR has been calculated using the CKD EPI equation. This calculation has not been validated in all clinical situations. eGFR's persistently <90 mL/min signify possible Chronic Kidney Disease.    Anion gap 7 5 - 15  Digoxin level     Status: Abnormal   Collection Time: 01/12/15  3:45 AM  Result Value Ref Range   Digoxin Level 0.4 (L) 0.8 - 2.0 ng/mL  Carboxyhemoglobin     Status: Abnormal   Collection Time: 01/12/15  5:40 AM  Result Value Ref Range   Total hemoglobin 12.6 (L) 13.5 - 18.0 g/dL   O2 Saturation 44.7 %    Carboxyhemoglobin 1.9 (H) 0.5 - 1.5 %   Methemoglobin 0.9 0.0 - 1.5 %     ASSESSMENT AND PLAN: 59 yo with  history of CAD s/p multivessel PCI, ischemic cardiomyopathy with systolic CHF, CKD, and atrial flutter presented with atrial flutter/RVR and acute on chronic systolic CHF.   1. Acute on chronic systolic CHF: Ischemic cardiomyopathy +/- component of tachy-mediated CMP from atrial flutter, EF 15% by echo this admission.  He was started on milrinone due to concern for low output and sent to CCU, line was placed.  Good diuresis yesterday, CVP now 5-7.  He feels better.  Co-ox remains low, was 55% yesterday and 44% today.  - Will increase milrinone to 0.375 and repeat co-ox today.  If improved and tolerates higher dose, I think that he can go home this afternoon.  He will be going home on milrinone.    - On dig 0.125 mg daily, level ok today.    - Hydralazine 12.5 mg tid /Imdur 30 mg daily.  Will not titrate with soft blood pressure.  - Continue spironolactone 25 mg daily  - No ACEI yet with low BP and recent AKI, no beta blocker with low output.  - Transition to torsemide 40 mg po bid, KCl 40 daily.  - He was evaluated for advanced therapies 2 years ago at [facility] and deemed to be a poor candidate (apparently compliance issues and also lives alone).  He has 2 sons in [place]. He is considering moving to [place] to live with one of his sons after discharge.  If he does this (no longer living alone), he could be considered for transplant or LVAD.     - Ambulate today.   2. Atrial flutter: S/P AFL ablation 3/25. In NSR (sinus tachy) => repeat ECG.  Amio was stopped.  - Continue Eliquis 5 mg bid.   3. AKI on CKD: Renal function improved, suspect now at baseline.  4. CAD: h/o PCI.  Slight troponin elevation, suspect demand ischemia from CHF.  5. Gout: Left hand, better with colchicine.  6. Disposition: Possible home this afternoon.  Will need followup CHF clinic next week. Will need home health for milrinone  infusion.  Home meds: milrinone 0.375 mcg/kg/min, apixaban 5 mg bid, atorvastatin 80 mg daily, hydralazine 25 mg tid, Imdur 30 mg daily, digoxin 0.125 daily, spironolactone 25 daily, torsemide 40 bid, KCl 40 daily, colchicine 0.6 daily, diabetes meds as prior to admission.   Consult case manager for home milrinone. Possible discharge this afternoon.   Loralie Champagne    01/12/2015 7:39 AM

## 2015-01-16 NOTE — Telephone Encounter (Signed)
Daniel Blankenship I don't know if you have these papers or not I have not seen them, thanks

## 2015-01-24 ENCOUNTER — Encounter (HOSPITAL_COMMUNITY): Payer: Non-veteran care

## 2015-01-30 ENCOUNTER — Telehealth (HOSPITAL_COMMUNITY): Payer: Self-pay | Admitting: *Deleted

## 2015-01-30 NOTE — Telephone Encounter (Signed)
Nurse with [facility] said pt has a 5 lb weight gain in 4 days.  Pt weighed 175 Friday and is 280 today. Pt has no extra edema, normal vitals, everything is the same from when she met pt. Pt has an appt Thursday so this is just an FYI.

## 2015-02-01 ENCOUNTER — Ambulatory Visit (HOSPITAL_COMMUNITY)
Admission: RE | Admit: 2015-02-01 | Discharge: 2015-02-01 | Disposition: A | Discharge status: Home / Self Care | Payer: Medicare Other | Source: Ambulatory Visit | Attending: Internal Medicine | Admitting: Internal Medicine

## 2015-02-01 ENCOUNTER — Encounter (HOSPITAL_COMMUNITY): Payer: Self-pay

## 2015-02-01 ENCOUNTER — Other Ambulatory Visit: Payer: Self-pay

## 2015-02-01 VITALS — BP 126/68 | HR 91 | Wt 281.2 lb

## 2015-02-01 DIAGNOSIS — Z794 Long term (current) use of insulin: Secondary | ICD-10-CM | POA: Diagnosis not present

## 2015-02-01 DIAGNOSIS — E785 Hyperlipidemia, unspecified: Secondary | ICD-10-CM | POA: Diagnosis not present

## 2015-02-01 DIAGNOSIS — Z7901 Long term (current) use of anticoagulants: Secondary | ICD-10-CM | POA: Diagnosis not present

## 2015-02-01 DIAGNOSIS — I4892 Unspecified atrial flutter: Secondary | ICD-10-CM | POA: Diagnosis not present

## 2015-02-01 DIAGNOSIS — I5022 Chronic systolic (congestive) heart failure: Secondary | ICD-10-CM | POA: Diagnosis present

## 2015-02-01 DIAGNOSIS — Z79899 Other long term (current) drug therapy: Secondary | ICD-10-CM | POA: Diagnosis not present

## 2015-02-01 DIAGNOSIS — F0150 Vascular dementia without behavioral disturbance: Secondary | ICD-10-CM | POA: Insufficient documentation

## 2015-02-01 DIAGNOSIS — M109 Gout, unspecified: Secondary | ICD-10-CM | POA: Diagnosis not present

## 2015-02-01 DIAGNOSIS — I255 Ischemic cardiomyopathy: Secondary | ICD-10-CM | POA: Insufficient documentation

## 2015-02-01 DIAGNOSIS — I129 Hypertensive chronic kidney disease with stage 1 through stage 4 chronic kidney disease, or unspecified chronic kidney disease: Secondary | ICD-10-CM | POA: Insufficient documentation

## 2015-02-01 DIAGNOSIS — E119 Type 2 diabetes mellitus without complications: Secondary | ICD-10-CM | POA: Diagnosis not present

## 2015-02-01 DIAGNOSIS — Z7982 Long term (current) use of aspirin: Secondary | ICD-10-CM | POA: Insufficient documentation

## 2015-02-01 DIAGNOSIS — Z95810 Presence of automatic (implantable) cardiac defibrillator: Secondary | ICD-10-CM | POA: Diagnosis not present

## 2015-02-01 DIAGNOSIS — I2510 Atherosclerotic heart disease of native coronary artery without angina pectoris: Secondary | ICD-10-CM | POA: Diagnosis not present

## 2015-02-01 DIAGNOSIS — N189 Chronic kidney disease, unspecified: Secondary | ICD-10-CM | POA: Diagnosis not present

## 2015-02-01 NOTE — Patient Instructions (Signed)
STOP Lopressor.  FOLLOW UP in 4 weeks.

## 2015-02-01 NOTE — Progress Notes (Signed)
Patient ID: Daniel OlpChristopher T Fagerstrom, male   DOB: 05-24-1956, 59 y.o.   MRN: [id number] PCP: Primary Cardiologist: Dr Shirlee LatchMclean EP: Dr Johney FrameAllred  HPI: 59 yo with history of CAD s/p multivessel PCI, ischemic cardiomyopathy with systolic CHF, CKD, St jude ICD, and atrial flutter [facility] with atrial flutter/RVR and acute on chronic systolic CHF. Underwent successful atrial flutter ablation on 3/25. He was lethargic post-extubation so was re-intubated. He then woke up and self-extubated. Amiodarone stopped. Maintaining NSR. Unfortunately he had low output and was discharged on home milrinone at 0.375 mcg.   He returns for post hospital follow up for heart failure. Remains on milrinone 0.375 mcg via PICC. Wants to move to [place] with his grown children. Denies SOB/PND. Sleeps with head elevated. SOB with a shower and getting dressed. Weight at home 275-280 pounds. Does not go to the grocery store. [facility] following. Only walking in the house. Taking all medications. He has missed his medications one time.  [facility]    ECHO 01/02/2015 EF 15%   Labs 01/12/2015: k 3.9 Creatinine 1.39 Hgb 11.7 dig level 0.4   ROS: All systems negative except as listed in HPI, PMH and Problem List.  SH:  History   Social History  . Marital Status: Single    Spouse Name: N/A  . Number of Children: N/A  . Years of Education: N/A   Occupational History  . Not on file.   Social History Main Topics  . Smoking status: Never Smoker   . Smokeless tobacco: Not on file  . Alcohol Use: No  . Drug Use: Not on file  . Sexual Activity: Not on file   Other Topics Concern  . Not on file   Social History Narrative    FH:  Family History  Problem Relation Age of Onset  . Family history unknown: Yes    Past Medical History  Diagnosis Date  . Diabetes mellitus without complication   . Atrial flutter with rapid ventricular response   . Acute renal failure   . Vascular dementia   . CHF  (congestive heart failure)   . Delirium   . Ischemic cardiomyopathy   . Septic shock   . Pneumonia   . CAD (coronary artery disease)   . Anemia   . Hypertension   . Hyperlipidemia   . Anxiety     Current Outpatient Prescriptions  Medication Sig Dispense Refill  . apixaban (ELIQUIS) 5 MG TABS tablet Take 1 tablet (5 mg total) by mouth 2 (two) times daily. 60 tablet 11  . aspirin 81 MG tablet Take 81 mg by mouth daily.    Marland Kitchen. atorvastatin (LIPITOR) 80 MG tablet Take 40 mg by mouth daily.     . colchicine 0.6 MG tablet Take 1 tablet (0.6 mg total) by mouth daily. 30 tablet 11  . digoxin (LANOXIN) 0.125 MG tablet Take 1 tablet (0.125 mg total) by mouth daily. 30 tablet 11  . docusate sodium (COLACE) 100 MG capsule Take 200 mg by mouth daily.    . hydrALAZINE (APRESOLINE) 25 MG tablet Take 0.5 tablets (12.5 mg total) by mouth every 8 (eight) hours. 50 tablet 11  . insulin glargine (LANTUS) 100 UNIT/ML injection Inject 0.4 mLs (40 Units total) into the skin at bedtime. 10 mL 0  . isosorbide mononitrate (IMDUR) 30 MG 24 hr tablet Take 1 tablet (30 mg total) by mouth daily. 30 tablet 11  . metoprolol tartrate (LOPRESSOR) 25 MG tablet Take  25 mg by mouth every 6 (six) hours.    . milrinone (PRIMACOR) 20 MG/100ML SOLN infusion Inject 50.0625 mcg/min into the vein continuous. 100 mL 3  . oxyCODONE-acetaminophen (ROXICET) 5-325 MG per tablet Take 1 tablet by mouth every 6 (six) hours as needed for pain. 120 tablet 0  . pantoprazole (PROTONIX) 40 MG tablet Take 1 tablet (40 mg total) by mouth daily. 30 tablet 11  . potassium chloride SA (K-DUR,KLOR-CON) 20 MEQ tablet Take 2 tablets (40 mEq total) by mouth 2 (two) times daily. 60 tablet 11  . spironolactone (ALDACTONE) 25 MG tablet Take 1 tablet (25 mg total) by mouth daily. 30 tablet 11  . torsemide (DEMADEX) 20 MG tablet Take 2 tablets (40 mg total) by mouth 2 (two) times daily. 60 tablet 11   No current facility-administered medications for this  encounter.    Filed Vitals:   02/01/15 1454  BP: 126/68  Pulse: 91  Weight: 281 lb 4 oz (127.574 kg)  SpO2: 97%    PHYSICAL EXAM:  General:  Well appearing. No resp difficulty. Arrived in wheel chair.  HEENT: normal Neck: supple. JVP 5-6. Carotids 2+ bilaterally; no bruits. No lymphadenopathy or thryomegaly appreciated. Cor: PMI normal. Regular rate & rhythm. No rubs, gallops or murmurs. Lungs: clear Abdomen: soft, nontender, nondistended. No hepatosplenomegaly. No bruits or masses. Good bowel sounds. Extremities: no cyanosis, clubbing, rash, edema. LUE PICC double lumen PICC Neuro: alert & orientedx3, cranial nerves grossly intact. Moves all 4 extremities w/o difficulty. Affect pleasant.    ASSESSMENT & PLAN: 1. Chronic systolic CHF: Ischemic cardiomyopathy +/- component of tachy-mediated CMP from atrial flutter, EF 15% by echo 12/2014  On milrinone 0.375 mcg via PICC.  On dig 0.125 mg daily, level ok today.  Volume status  Stable. Continue torsemide 40 mg po bid, KCl 40 daily.  - Hydralazine 12.5 mg tid /Imdur 30 mg daily. Will not titrate with soft blood pressure.  - Continue spironolactone 25 mg daily  - No ACEI yet with low BP and recent AKI, no beta blocker with low output.  [facility] following for [facility]one.  - He was evaluated for advanced therapies 2 years ago at [facility] and deemed to be a poor candidate (apparently compliance issues and also lives alone). He has 2 sons in [place]. He is considering moving to [place] to live with one of his sons after discharge. If he does this (no longer living alone), he could be considered for transplant or LVAD.   2. Atrial flutter: S/P AFL ablation 3/25.  Stop lopressor was not on in the hospital. Continue Eliquis 5 mg bid. No bleeding problems.  3. AKI on CKD: Renal function improved, suspect now at baseline.  4. CAD: h/o PCI. No evidence of ischemia. On statin and eliquis.  5. Gout: Left hand, better with colchicine.   Follow  up in 1 month. I contacted [facility] regarding move to [place]. Will need to make sure he has milrinone 0.375 mcg and home health in [place]. Ok with Dr Gala Romney to move once he has HF doctor in [place]. He was provided with Dr Vito Backers office number.   CLEGG,AMY NP-C  3:38 PM  Patient seen and examined with Tonye Becket, NP. We discussed all aspects of the encounter. I agree with the assessment and plan as stated above.   He is stable on milrinone. Volume status looks ok. Will stop b-blocker. Continue milrinone. Provided contact info fro Dr. Storm Frisk ([facility] doc in [facility]). I asked him to  arrange appt there prior to leaving [place].  Reuel Boom Lyrik Buresh,MD 11:33 PM

## 2015-02-05 ENCOUNTER — Ambulatory Visit (INDEPENDENT_AMBULATORY_CARE_PROVIDER_SITE_OTHER): Payer: Medicare Other | Admitting: Internal Medicine

## 2015-02-05 ENCOUNTER — Encounter: Payer: Self-pay | Admitting: Internal Medicine

## 2015-02-05 VITALS — BP 90/62 | HR 107 | Ht 75.0 in | Wt 281.4 lb

## 2015-02-05 DIAGNOSIS — I4892 Unspecified atrial flutter: Secondary | ICD-10-CM | POA: Diagnosis not present

## 2015-02-05 NOTE — Progress Notes (Signed)
Primary Cardiologist:  CHF clinic  Daniel Blankenship is a 59 y.o. male who presents today for electrophysiology followup.  Since his recent atrial flutter ablation, the patient reports doing very well.  His exercise tolerance and SOB are improved.  + stable  lower extremity edema.   Today, he denies symptoms of palpitations, chest pain, dizziness, presyncope, or syncope.  The patient is otherwise without complaint today.   Past Medical History  Diagnosis Date  . Diabetes mellitus without complication   . Atrial flutter with rapid ventricular response   . Acute renal failure   . Vascular dementia   . CHF (congestive heart failure)   . Delirium   . Ischemic cardiomyopathy   . Septic shock   . Pneumonia   . CAD (coronary artery disease)   . Anemia   . Hypertension   . Hyperlipidemia   . Anxiety    Past Surgical History  Procedure Laterality Date  . Cardioversion    . Joint replacement    . Coronary angioplasty with stent placement    . Cardiac defibrillator placement    . Atrial flutter ablation N/A 01/05/2015    Procedure: ATRIAL FLUTTER ABLATION;  Surgeon: Hillis RangeJames Rolin Schult, MD;  [facility];  Service: Cardiovascular;  Laterality: N/A;  . Tee without cardioversion N/A 01/05/2015    Procedure: TRANSESOPHAGEAL ECHOCARDIOGRAM (TEE);  Surgeon: Laurey Moralealton S McLean, MD;  [facility];  Service: Cardiovascular;  Laterality: N/A;    ROS- all systems are reviewed and negatives except as per HPI above  Current Outpatient Prescriptions  Medication Sig Dispense Refill  . atorvastatin (LIPITOR) 80 MG tablet Take 40 mg by mouth daily.     . colchicine 0.6 MG tablet Take 1 tablet (0.6 mg total) by mouth daily. 30 tablet 11  . digoxin (LANOXIN) 0.125 MG tablet Take 1 tablet (0.125 mg total) by mouth daily. 30 tablet 11  . docusate sodium (COLACE) 100 MG capsule Take 200 mg by mouth daily.    . hydrALAZINE (APRESOLINE) 25 MG tablet Take 0.5 tablets (12.5 mg total) by mouth every 8  (eight) hours. 50 tablet 11  . insulin glargine (LANTUS) 100 UNIT/ML injection Inject 0.4 mLs (40 Units total) into the skin at bedtime. 10 mL 0  . isosorbide mononitrate (IMDUR) 30 MG 24 hr tablet Take 1 tablet (30 mg total) by mouth daily. 30 tablet 11  . milrinone (PRIMACOR) 20 MG/100ML SOLN infusion Inject 50.0625 mcg/min into the vein continuous. 100 mL 3  . oxyCODONE-acetaminophen (ROXICET) 5-325 MG per tablet Take 1 tablet by mouth every 6 (six) hours as needed for pain. 120 tablet 0  . pantoprazole (PROTONIX) 40 MG tablet Take 1 tablet (40 mg total) by mouth daily. 30 tablet 11  . potassium chloride SA (K-DUR,KLOR-CON) 20 MEQ tablet Take 2 tablets (40 mEq total) by mouth 2 (two) times daily. 60 tablet 11  . spironolactone (ALDACTONE) 25 MG tablet Take 1 tablet (25 mg total) by mouth daily. 30 tablet 11  . torsemide (DEMADEX) 20 MG tablet Take 2 tablets (40 mg total) by mouth 2 (two) times daily. 60 tablet 11   No current facility-administered medications for this visit.    Physical Exam: Filed Vitals:   02/05/15 1647  BP: 90/62  Pulse: 107  Height: 6\' 3"  (1.905 m)  Weight: 281 lb 6.4 oz (127.642 kg)    GEN- The patient is chronically ill appearing, alert and oriented x 3 today.   Head- normocephalic, atraumatic Eyes-  Sclera clear,  conjunctiva pink Ears- hearing intact Oropharynx- clear Lungs- Clear to ausculation bilaterally, normal work of breathing Heart- Regular rate and rhythm, no murmurs, rubs or gallops, PMI not laterally displaced GI- soft, NT, ND, + BS Extremities- no clubbing, cyanosis, + edema ICD pocket is well healed  ekg today reveals sinus tachycardia 107 bpm, anterior infarction  Assessment and Plan:  1. Atrial flutter Doing well s/p ablation without recurrence No prior documentation of afib Will therefore stop eliquis CHF clinic to monitor clinically for afib in the future  2. CHF s/p ICD Prefer to have his ICD followed in the [place] system  I will  therefore see as needed going forward   Hillis Range MD, [facility] 02/05/2015 5:13 PM

## 2015-02-05 NOTE — Patient Instructions (Signed)
Medication Instructions:  Your physician has recommended you make the following change in your medication: 1) Stop Eliquis    Labwork: None ordered  Testing/Procedures: None ordered  Follow-Up: Your physician wants you to follow-up as needed   Any Other Special Instructions Will Be Listed Below (If Applicable).

## 2015-03-01 ENCOUNTER — Encounter (HOSPITAL_COMMUNITY): Payer: Self-pay

## 2015-03-01 ENCOUNTER — Encounter (HOSPITAL_COMMUNITY): Payer: Self-pay | Admitting: Internal Medicine

## 2015-03-01 ENCOUNTER — Ambulatory Visit (HOSPITAL_COMMUNITY)
Admission: RE | Admit: 2015-03-01 | Discharge: 2015-03-01 | Disposition: A | Discharge status: Home / Self Care | Payer: Medicare Other | Source: Ambulatory Visit | Attending: Cardiology | Admitting: Cardiology

## 2015-03-01 VITALS — BP 128/82 | HR 100 | Wt 282.2 lb

## 2015-03-01 DIAGNOSIS — I5020 Unspecified systolic (congestive) heart failure: Secondary | ICD-10-CM | POA: Diagnosis not present

## 2015-03-01 DIAGNOSIS — I5022 Chronic systolic (congestive) heart failure: Secondary | ICD-10-CM | POA: Diagnosis not present

## 2015-03-01 LAB — CARBOXYHEMOGLOBIN
CARBOXYHEMOGLOBIN: 1.1 % (ref 0.5–1.5)
Methemoglobin: 0.8 % (ref 0.0–1.5)
O2 SAT: 76.1 %
Total hemoglobin: 14.7 g/dL (ref 13.5–18.0)

## 2015-03-01 MED ORDER — HYDRALAZINE HCL 25 MG PO TABS: 25.0000 mg | ORAL_TABLET | Freq: Three times a day (TID) | ORAL | Status: DC

## 2015-03-01 NOTE — Patient Instructions (Signed)
INCREASE Hydralazine to 25mg  (1 tablet) three times daily.  Follow up 4 weeks.  Do the following things EVERYDAY: 1) Weigh yourself in the morning before breakfast. Write it down and keep it in a log. 2) Take your medicines as prescribed 3) Eat low salt foods-Limit salt (sodium) to 2000 mg per day.  4) Stay as active as you can everyday 5) Limit all fluids for the day to less than 2 liters

## 2015-03-01 NOTE — Progress Notes (Signed)
Patient ID: Tommas OlpChristopher T Shiner, male   DOB: September 16, 1956, 59 y.o.   MRN: [id number] PCP: Primary Cardiologist: Dr Shirlee LatchMclean EP: Dr Johney FrameAllred  HPI: 59 yo with history of CAD s/p multivessel PCI, ischemic cardiomyopathy with systolic CHF, CKD, St jude ICD, and atrial flutter [facility] with atrial flutter/RVR and acute on chronic systolic CHF. Underwent successful atrial flutter ablation on 3/25. He was lethargic post-extubation so was re-intubated. He then woke up and self-extubated. Amiodarone stopped. Maintaining NSR. Unfortunately he had low output and was discharged on home milrinone at 0.375 mcg.   He returns for follow up for heart failure. Remains on milrinone 0.375 mcg via PICC. Denies SOB/PND. Sleeps with head elevated. SOB with a shower and getting dressed. Weight at home 275-280 pounds. . [facility] following. Only walking in the house. Taking all medications.  [facility]   ECHO 01/02/2015 EF 15%   Labs 01/12/2015: k 3.9 Creatinine 1.39 Hgb 11.7 dig level 0.4   ROS: All systems negative except as listed in HPI, PMH and Problem List.  SH:  History   Social History  . Marital Status: Single    Spouse Name: N/A  . Number of Children: N/A  . Years of Education: N/A   Occupational History  . Not on file.   Social History Main Topics  . Smoking status: Never Smoker   . Smokeless tobacco: Not on file  . Alcohol Use: No  . Drug Use: Not on file  . Sexual Activity: Not on file   Other Topics Concern  . Not on file   Social History Narrative    FH:  Family History  Problem Relation Age of Onset  . Family history unknown: Yes    Past Medical History  Diagnosis Date  . Diabetes mellitus without complication   . Atrial flutter with rapid ventricular response   . Acute renal failure   . Vascular dementia   . CHF (congestive heart failure)   . Delirium   . Ischemic cardiomyopathy   . Septic shock   . Pneumonia   . CAD (coronary artery disease)    . Anemia   . Hypertension   . Hyperlipidemia   . Anxiety     Current Outpatient Prescriptions  Medication Sig Dispense Refill  . atorvastatin (LIPITOR) 80 MG tablet Take 40 mg by mouth daily.     . colchicine 0.6 MG tablet Take 1 tablet (0.6 mg total) by mouth daily. 30 tablet 11  . digoxin (LANOXIN) 0.125 MG tablet Take 1 tablet (0.125 mg total) by mouth daily. 30 tablet 11  . docusate sodium (COLACE) 100 MG capsule Take 200 mg by mouth daily.    . hydrALAZINE (APRESOLINE) 25 MG tablet Take 0.5 tablets (12.5 mg total) by mouth every 8 (eight) hours. 50 tablet 11  . insulin glargine (LANTUS) 100 UNIT/ML injection Inject 0.4 mLs (40 Units total) into the skin at bedtime. 10 mL 0  . isosorbide mononitrate (IMDUR) 30 MG 24 hr tablet Take 1 tablet (30 mg total) by mouth daily. 30 tablet 11  . milrinone (PRIMACOR) 20 MG/100ML SOLN infusion Inject 50.0625 mcg/min into the vein continuous. 100 mL 3  . oxyCODONE-acetaminophen (ROXICET) 5-325 MG per tablet Take 1 tablet by mouth every 6 (six) hours as needed for pain. 120 tablet 0  . pantoprazole (PROTONIX) 40 MG tablet Take 1 tablet (40 mg total) by mouth daily. 30 tablet 11  . potassium chloride SA (K-DUR,KLOR-CON) 20 MEQ tablet Take  2 tablets (40 mEq total) by mouth 2 (two) times daily. 60 tablet 11  . spironolactone (ALDACTONE) 25 MG tablet Take 1 tablet (25 mg total) by mouth daily. 30 tablet 11  . torsemide (DEMADEX) 20 MG tablet Take 2 tablets (40 mg total) by mouth 2 (two) times daily. 60 tablet 11   No current facility-administered medications for this encounter.    Filed Vitals:   03/01/15 1347  BP: 128/82  Pulse: 100  Weight: 282 lb 4 oz (128.028 kg)  SpO2: 98%    PHYSICAL EXAM:  General:  Well appearing. No resp difficulty. Arrived in wheel chair.  HEENT: normal Neck: supple. JVP 5-6. Carotids 2+ bilaterally; no bruits. No lymphadenopathy or thryomegaly appreciated. Cor: PMI normal. Regular rate & rhythm. No rubs, gallops  or murmurs. Lungs: clear Abdomen: soft, nontender, nondistended. No hepatosplenomegaly. No bruits or masses. Good bowel sounds. Extremities: no cyanosis, clubbing, rash, edema. LUE PICC double lumen PICC Neuro: alert & orientedx3, cranial nerves grossly intact. Moves all 4 extremities w/o difficulty. Affect pleasant.    ASSESSMENT & PLAN: 1. Chronic systolic CHF: Ischemic cardiomyopathy +/- component of tachy-mediated CMP from atrial flutter, EF 15% by echo 12/2014  On milrinone 0.375 mcg via PICC. Check CO-OX today  Continue dig 0.125 mg daily  Volume status stable. Continue torsemide 40 mg po bid, KCl 40 daily.  - Increase Hydralazine  25 mg tid /Imdur 30 mg daily.  - Continue spironolactone 25 mg daily  - No ACEI yet with low BP and recent AKI, no beta blocker with low output.  [facility] following for milrinone.  - He [facility] and deemed to be a poor candidate (apparently compliance issues and also lives alone). He had decided not to go [place].  2. Atrial flutter: S/P AFL ablation 3/25.  Continue Eliquis 5 mg bid. No bleeding problems.  3. AKI on CKD: Renal function improved, suspect now at baseline.  4. CAD: h/o PCI. No evidence of ischemia. On statin and eliquis.  5. Gout: Left hand, better with colchicine.   Follow up in 1 month   CLEGG,AMY NP-C  2:29 PM  Patient seen and examined with Tonye BecketAmy Clegg, NP. We discussed all aspects of the encounter. I agree with the assessment and plan as stated above.   His HF is stable. Will check co-ox today and attempt to wean milrinone as tolerated. Given social issues I do not feel that he is an ideal candidate for VAD. Will increase hydralazine.   Dotty Gonzalo,MD 10:12 PM

## 2015-03-06 ENCOUNTER — Encounter: Payer: Self-pay | Admitting: Internal Medicine

## 2015-03-08 ENCOUNTER — Encounter: Payer: Self-pay | Admitting: Internal Medicine

## 2015-03-09 ENCOUNTER — Telehealth (HOSPITAL_COMMUNITY): Payer: Self-pay

## 2015-03-09 NOTE — Telephone Encounter (Signed)
[facility] called to recert orders with patient, including lab work, PICC line mgmt, milrinone drip mgmt, and CHF mgmt.  Orders given for 60 day continuation.  Ave FilterBradley, Megan Genevea

## 2015-03-11 ENCOUNTER — Encounter (HOSPITAL_COMMUNITY): Payer: Self-pay | Admitting: Emergency Medicine

## 2015-03-11 DIAGNOSIS — I509 Heart failure, unspecified: Secondary | ICD-10-CM | POA: Diagnosis not present

## 2015-03-11 DIAGNOSIS — Z8701 Personal history of pneumonia (recurrent): Secondary | ICD-10-CM | POA: Diagnosis not present

## 2015-03-11 DIAGNOSIS — I2510 Atherosclerotic heart disease of native coronary artery without angina pectoris: Secondary | ICD-10-CM | POA: Insufficient documentation

## 2015-03-11 DIAGNOSIS — Z87448 Personal history of other diseases of urinary system: Secondary | ICD-10-CM | POA: Insufficient documentation

## 2015-03-11 DIAGNOSIS — M109 Gout, unspecified: Secondary | ICD-10-CM | POA: Diagnosis present

## 2015-03-11 DIAGNOSIS — E119 Type 2 diabetes mellitus without complications: Secondary | ICD-10-CM | POA: Diagnosis not present

## 2015-03-11 DIAGNOSIS — I10 Essential (primary) hypertension: Secondary | ICD-10-CM | POA: Insufficient documentation

## 2015-03-11 DIAGNOSIS — E785 Hyperlipidemia, unspecified: Secondary | ICD-10-CM | POA: Diagnosis not present

## 2015-03-11 DIAGNOSIS — Z794 Long term (current) use of insulin: Secondary | ICD-10-CM | POA: Diagnosis not present

## 2015-03-11 DIAGNOSIS — Z862 Personal history of diseases of the blood and blood-forming organs and certain disorders involving the immune mechanism: Secondary | ICD-10-CM | POA: Diagnosis not present

## 2015-03-11 DIAGNOSIS — Z79899 Other long term (current) drug therapy: Secondary | ICD-10-CM | POA: Diagnosis not present

## 2015-03-11 DIAGNOSIS — F0150 Vascular dementia without behavioral disturbance: Secondary | ICD-10-CM | POA: Insufficient documentation

## 2015-03-11 DIAGNOSIS — M10041 Idiopathic gout, right hand: Secondary | ICD-10-CM | POA: Insufficient documentation

## 2015-03-11 DIAGNOSIS — Z8619 Personal history of other infectious and parasitic diseases: Secondary | ICD-10-CM | POA: Diagnosis not present

## 2015-03-11 NOTE — ED Notes (Signed)
Pt. reports gout flare up at right hand today with pain and swelling , pt. stated that he is not taking  medication for his gout. Denies fever or chills.

## 2015-03-12 ENCOUNTER — Emergency Department (HOSPITAL_COMMUNITY)
Admission: EM | Admit: 2015-03-12 | Discharge: 2015-03-12 | Disposition: A | Discharge status: Home Health | Payer: Medicare Other | Attending: Emergency Medicine | Admitting: Emergency Medicine

## 2015-03-12 DIAGNOSIS — M109 Gout, unspecified: Secondary | ICD-10-CM

## 2015-03-12 HISTORY — DX: Gout, unspecified: M10.9

## 2015-03-12 MED ORDER — COLCHICINE 0.6 MG PO TABS: 0.6000 mg | ORAL_TABLET | Freq: Once | ORAL | Status: DC

## 2015-03-12 MED ORDER — OXYCODONE-ACETAMINOPHEN 5-325 MG PO TABS
1.0000 | ORAL_TABLET | Freq: Once | ORAL | Status: AC
Start: 2015-03-12 — End: 2015-03-12
  Administered 2015-03-12: 1 via ORAL
  Filled 2015-03-12: qty 1

## 2015-03-12 MED ORDER — COLCHICINE 0.6 MG PO TABS
1.2000 mg | ORAL_TABLET | Freq: Once | ORAL | Status: AC
  Administered 2015-03-12: 1.2 mg via ORAL
  Filled 2015-03-12: qty 2

## 2015-03-12 NOTE — Discharge Instructions (Signed)
Gout °Gout is when your joints become red, sore, and swell (inflamed). This is caused by the buildup of uric acid crystals in the joints. Uric acid is a chemical that is normally in the blood. If the level of uric acid gets too high in the blood, these crystals form in your joints and tissues. Over time, these crystals can form into masses near the joints and tissues. These masses can destroy bone and cause the bone to look misshapen (deformed). °HOME CARE  °· Do not take aspirin for pain. °· Only take medicine as told by your doctor. °· Rest the joint as much as you can. When in bed, keep sheets and blankets off painful areas. °· Keep the sore joints raised (elevated). °· Put warm or cold packs on painful joints. Use of warm or cold packs depends on which works best for you. °· Use crutches if the painful joint is in your leg. °· Drink enough fluids to keep your pee (urine) clear or pale yellow. Limit alcohol, sugary drinks, and drinks with fructose in them. °· Follow your diet instructions. Pay careful attention to how much protein you eat. Include fruits, vegetables, whole grains, and fat-free or low-fat milk products in your daily diet. Talk to your doctor or dietitian about the use of coffee, vitamin C, and cherries. These may help lower uric acid levels. °· Keep a healthy body weight. °GET HELP RIGHT AWAY IF:  °· You have watery poop (diarrhea), throw up (vomit), or have any side effects from medicines. °· You do not feel better in 24 hours, or you are getting worse. °· Your joint becomes suddenly more tender, and you have chills or a fever. °MAKE SURE YOU:  °· Understand these instructions. °· Will watch your condition. °· Will get help right away if you are not doing well or get worse. °Document Released: 07/08/2008 Document Revised: 02/13/2014 Document Reviewed: 05/12/2012 °ExitCare® Patient Information ©2015 [employer]. This information is not intended to replace advice given to you by your health care  provider. Make sure you discuss any questions you have with your health care provider. ° °Low-Purine Diet °Purines are compounds that affect the level of uric acid in your body. A low-purine diet is a diet that is low in purines. Eating a low-purine diet can prevent the level of uric acid in your body from getting too high and causing gout or kidney stones or both. °WHAT DO I NEED TO KNOW ABOUT THIS DIET? °· Choose low-purine foods. Examples of low-purine foods are listed in the next section. °· Drink plenty of fluids, especially water. Fluids can help remove uric acid from your body. Try to drink 8-16 cups (1.9-3.8 L) a day. °· Limit foods high in fat, especially saturated fat, as fat makes it harder for the body to get rid of uric acid. Foods high in saturated fat include pizza, cheese, ice cream, whole milk, fried foods, and gravies. Choose foods that are lower in fat and lean sources of protein. Use olive oil when cooking as it contains healthy fats that are not high in saturated fat. °· Limit alcohol. Alcohol interferes with the elimination of uric acid from your body. If you are having a gout attack, avoid all alcohol. °· Keep in mind that different people's bodies react differently to different foods. You will probably learn over time which foods do or do not affect you. If you discover that a food tends to cause your gout to flare up, avoid eating that   food. You can more freely enjoy foods that do not cause problems. If you have any questions about a food item, talk to your dietitian or health care provider. °WHICH FOODS ARE LOW, MODERATE, AND HIGH IN PURINES? °The following is a list of foods that are low, moderate, and high in purines. You can eat any amount of the foods that are low in purines. You may be able to have small amounts of foods that are moderate in purines. Ask your health care provider how much of a food moderate in purines you can have. Avoid foods high in purines. °Grains °· Foods low in  purines: Enriched white bread, pasta, rice, cake, cornbread, popcorn. °· Foods moderate in purines: Whole-grain breads and cereals, wheat germ, bran, oatmeal. Uncooked oatmeal. Dry wheat bran or wheat germ. °· Foods high in purines: Pancakes, French toast, biscuits, muffins. °Vegetables °· Foods low in purines: All vegetables, except those that are moderate in purines. °· Foods moderate in purines: Asparagus, cauliflower, spinach, mushrooms, green peas. °Fruits °· All fruits are low in purines. °Meats and other Protein Foods °· Foods low in purines: Eggs, nuts, peanut butter. °· Foods moderate in purines: 80-90% lean beef, lamb, veal, pork, poultry, fish, eggs, peanut butter, nuts. Crab, lobster, oysters, and shrimp. Cooked dried beans, peas, and lentils. °· Foods high in purines: Anchovies, sardines, herring, mussels, tuna, codfish, scallops, trout, and haddock. Bacon. Organ meats (such as liver or kidney). Tripe. Game meat. Goose. Sweetbreads. °Dairy °· All dairy foods are low in purines. Low-fat and fat-free dairy products are best because they are low in saturated fat. °Beverages °· Drinks low in purines: Water, carbonated beverages, tea, coffee, cocoa. °· Drinks moderate in purines: Soft drinks and other drinks sweetened with high-fructose corn syrup. Juices. To find whether a food or drink is sweetened with high-fructose corn syrup, look at the ingredients list. °· Drinks high in purines: Alcoholic beverages (such as beer). °Condiments °· Foods low in purines: Salt, herbs, olives, pickles, relishes, vinegar. °· Foods moderate in purines: Butter, margarine, oils, mayonnaise. °Fats and Oils °· Foods low in purines: All types, except gravies and sauces made with meat. °· Foods high in purines: Gravies and sauces made with meat. °Other Foods °· Foods low in purines: Sugars, sweets, gelatin. Cake. Soups made without meat. °· Foods moderate in purines: Meat-based or fish-based soups, broths, or bouillons. Foods and  drinks sweetened with high-fructose corn syrup. °· Foods high in purines: High-fat desserts (such as ice cream, cookies, cakes, pies, doughnuts, and chocolate). °Contact your dietitian for more information on foods that are not listed here. °Document Released: 01/24/2011 Document Revised: 10/04/2013 Document Reviewed: 09/05/2013 °ExitCare® Patient Information ©2015 [employer]. This information is not intended to replace advice given to you by your health care provider. Make sure you discuss any questions you have with your health care provider. ° °

## 2015-03-12 NOTE — ED Notes (Signed)
picc Line is dry and intact. Family to bedside

## 2015-03-12 NOTE — ED Provider Notes (Signed)
CSN: [id number]     Arrival date & time 03/11/15  2346 History   None    Chief Complaint  Patient presents with  . Gout     (Consider location/radiation/quality/duration/timing/severity/associated sxs/prior Treatment) HPI  Pt is a 59yo male with hx of gout, presenting to ED with c/o sudden onset pain, swelling and redness of Right hand that started yesterday. Pain is burning and throbbing, 10/10, worse with movement and light touch. States it feels like the gout flare he had 1 month ago. States he was prescribed colchicine 1 month ago, took it for 3 days and symptoms resolved completely. Pt states he has not been taking his gout medication at home since the pain had completely resolved. States he did try percocet last night with temporary relief. Denies fever, chills, n/v/d. Denies known injury to hand or insect bites. Pt states he stays inside all day.   Past Medical History  Diagnosis Date  . Diabetes mellitus without complication   . Atrial flutter with rapid ventricular response   . Acute renal failure   . Vascular dementia   . CHF (congestive heart failure)   . Delirium   . Ischemic cardiomyopathy   . Septic shock   . Pneumonia   . CAD (coronary artery disease)   . Anemia   . Hypertension   . Hyperlipidemia   . Anxiety   . Gout of hand    Past Surgical History  Procedure Laterality Date  . Cardioversion    . Joint replacement    . Coronary angioplasty with stent placement    . Cardiac defibrillator placement    . Atrial flutter ablation N/A 01/05/2015    Procedure: ATRIAL FLUTTER ABLATION;  Surgeon: Hillis Range, MD;  Location: [facility] CATH LAB;  Service: Cardiovascular;  Laterality: N/A;  . Tee without cardioversion N/A 01/05/2015    Procedure: TRANSESOPHAGEAL ECHOCARDIOGRAM (TEE);  Surgeon: Laurey Morale, MD;  Location: [facility] ENDOSCOPY;  Service: Cardiovascular;  Laterality: N/A;   Family History  Problem Relation Age of Onset  . Family history unknown: Yes   History   Substance Use Topics  . Smoking status: Never Smoker   . Smokeless tobacco: Not on file  . Alcohol Use: No    Review of Systems  Constitutional: Negative for fever, chills and fatigue.  Respiratory: Negative for cough and shortness of breath.   Cardiovascular: Negative for chest pain and palpitations.  Gastrointestinal: Negative for nausea, vomiting and diarrhea.  Musculoskeletal: Positive for myalgias, joint swelling and arthralgias.       Right hand  Skin: Positive for color change. Negative for rash and wound.       Right hand redness  Neurological: Negative for weakness and numbness.  All other systems reviewed and are negative.     Allergies  Sulfa antibiotics; Sulfamethoxazole; Zocor; and Lisinopril  Home Medications   Prior to Admission medications   Medication Sig Start Date End Date Taking? Authorizing Provider  apixaban (ELIQUIS) 5 MG TABS tablet Take 5 mg by mouth 2 (two) times daily.    Historical Provider, MD  atorvastatin (LIPITOR) 80 MG tablet Take 40 mg by mouth daily.     Historical Provider, MD  colchicine 0.6 MG tablet Take 1 tablet (0.6 mg total) by mouth daily. 01/12/15   Rhonda G Barrett, PA-C  colchicine 0.6 MG tablet Take 1 tablet (0.6 mg total) by mouth once. You may take 2 tabs the first time for gout flare, then 1 tab one hour later. Take 1  tab daily until symptoms resolve 03/12/15   Junius FinnerErin O'Malley, PA-C  digoxin (LANOXIN) 0.125 MG tablet Take 1 tablet (0.125 mg total) by mouth daily. 01/12/15   Rhonda G Barrett, PA-C  docusate sodium (COLACE) 100 MG capsule Take 200 mg by mouth daily.    Historical Provider, MD  hydrALAZINE (APRESOLINE) 25 MG tablet Take 1 tablet (25 mg total) by mouth every 8 (eight) hours. 03/01/15   Dolores Pattyaniel R Bensimhon, MD  insulin glargine (LANTUS) 100 UNIT/ML injection Inject 0.4 mLs (40 Units total) into the skin at bedtime. 01/12/15   Rhonda G Barrett, PA-C  isosorbide mononitrate (IMDUR) 30 MG 24 hr tablet Take 1 tablet (30 mg total) by  mouth daily. 01/12/15   Rhonda G Barrett, PA-C  milrinone (PRIMACOR) 20 MG/100ML SOLN infusion Inject 50.0625 mcg/min into the vein continuous. 01/12/15   Rhonda G Barrett, PA-C  oxyCODONE-acetaminophen (ROXICET) 5-325 MG per tablet Take 1 tablet by mouth every 6 (six) hours as needed for pain. 04/14/13   Tiffany L Reed, DO  pantoprazole (PROTONIX) 40 MG tablet Take 1 tablet (40 mg total) by mouth daily. 01/12/15   Rhonda G Barrett, PA-C  potassium chloride SA (K-DUR,KLOR-CON) 20 MEQ tablet Take 2 tablets (40 mEq total) by mouth 2 (two) times daily. 01/12/15   Rhonda G Barrett, PA-C  spironolactone (ALDACTONE) 25 MG tablet Take 1 tablet (25 mg total) by mouth daily. 01/12/15   Rhonda G Barrett, PA-C  torsemide (DEMADEX) 20 MG tablet Take 2 tablets (40 mg total) by mouth 2 (two) times daily. 01/12/15   Rhonda G Barrett, PA-C   BP 115/72 mmHg  Pulse 120  Temp(Src) 100.1 F (37.8 C) (Oral)  Resp 16  SpO2 97% Physical Exam  Constitutional: He is oriented to person, place, and time. He appears well-developed and well-nourished.  HENT:  Head: Normocephalic and atraumatic.  Eyes: EOM are normal.  Neck: Normal range of motion.  Cardiovascular: Normal rate.   Pulses:      Radial pulses are 2+ on the right side.  Right hand: Cap refill <3 seconds  PICC line in place. Appears clean and dry.   Pulmonary/Chest: Effort normal.  Musculoskeletal: Normal range of motion. He exhibits edema and tenderness.  Right hand: moderate edema with erythema and warmth. Severe tenderness with light touch.  Increased pain with movement of Right hand. FROM.  Right wrist: FROM, non-tender  Neurological: He is alert and oriented to person, place, and time.  Skin: Skin is warm and dry. There is erythema.  Right hand: skin in tact, erythema over dorsum of hand, more pronounced over index and middle finger. Severe tenderness with light touch. No induration or fluctuance.   Psychiatric: He has a normal mood and affect. His behavior is  normal.  Nursing note and vitals reviewed.   ED Course  Procedures (including critical care time) Labs Review Labs Reviewed - No data to display  Imaging Review No results found.   EKG Interpretation None      MDM   Final diagnoses:  Acute gout of right hand, unspecified cause    Pt is 59yo male with hx of gout, presenting to ED with c/o gout flare. No known injury. Pt does have a low grade temp of 100.1.  Exam of Right hand c/w gout.  Pt otherwise appears well.  PICC line appears clean and dry.  No other symptoms besides hand pain, swelling and redness. Skin is in tact. No evidence of abscess. Will tx with colchicine, first dose given  in ED. Strongly encouraged pt to return for further evaluation if symptoms not improving in 2 days. Return precautions provided. Pt verbalized understanding and agreement with tx plan.     Junius Finner, PA-C 03/12/15 0020  Derwood Kaplan, MD 03/12/15 [phone]

## 2015-03-12 NOTE — ED Notes (Signed)
Pt states he wants to go home and go to bed.  Family states they will bring him back if he is not imnproving in the am

## 2015-03-14 ENCOUNTER — Telehealth (HOSPITAL_COMMUNITY): Payer: Self-pay | Admitting: Cardiology

## 2015-03-14 NOTE — Telephone Encounter (Signed)
Lab results, labs drawn 5/27 magnesium 1.7 Per Vo Amy Clegg, nP Pt should start magnesium 200 mg twice a day  Pt aware and voiced understanding

## 2015-03-19 ENCOUNTER — Encounter: Payer: Self-pay | Admitting: Internal Medicine

## 2015-03-20 ENCOUNTER — Encounter: Payer: Self-pay | Admitting: Internal Medicine

## 2015-03-28 ENCOUNTER — Encounter: Payer: Self-pay | Admitting: Internal Medicine

## 2015-03-29 ENCOUNTER — Ambulatory Visit (HOSPITAL_COMMUNITY)
Admission: RE | Admit: 2015-03-29 | Discharge: 2015-03-29 | Disposition: A | Discharge status: Home / Self Care | Payer: Medicare Other | Source: Ambulatory Visit | Attending: Internal Medicine | Admitting: Internal Medicine

## 2015-03-29 ENCOUNTER — Encounter (HOSPITAL_COMMUNITY): Payer: Self-pay

## 2015-03-29 VITALS — BP 116/72 | HR 90 | Wt 262.0 lb

## 2015-03-29 DIAGNOSIS — I5022 Chronic systolic (congestive) heart failure: Secondary | ICD-10-CM | POA: Insufficient documentation

## 2015-03-29 DIAGNOSIS — M109 Gout, unspecified: Secondary | ICD-10-CM | POA: Insufficient documentation

## 2015-03-29 DIAGNOSIS — N189 Chronic kidney disease, unspecified: Secondary | ICD-10-CM | POA: Insufficient documentation

## 2015-03-29 DIAGNOSIS — I2510 Atherosclerotic heart disease of native coronary artery without angina pectoris: Secondary | ICD-10-CM | POA: Diagnosis not present

## 2015-03-29 DIAGNOSIS — N183 Chronic kidney disease, stage 3 unspecified: Secondary | ICD-10-CM

## 2015-03-29 DIAGNOSIS — I4892 Unspecified atrial flutter: Secondary | ICD-10-CM | POA: Diagnosis not present

## 2015-03-29 LAB — CARBOXYHEMOGLOBIN
Carboxyhemoglobin: 1.5 % (ref 0.5–1.5)
METHEMOGLOBIN: 1 % (ref 0.0–1.5)
O2 Saturation: 86.4 %
TOTAL HEMOGLOBIN: 10.4 g/dL — AB (ref 13.5–18.0)

## 2015-03-29 NOTE — Addendum Note (Signed)
Encounter addended by: Noralee Space, RN on: 03/29/2015  2:45 PM<BR>     Documentation filed: Patient Instructions Section

## 2015-03-29 NOTE — Patient Instructions (Signed)
Your physician recommends that you schedule a follow-up appointment in: 4 weeks  

## 2015-03-29 NOTE — Addendum Note (Signed)
Encounter addended by: Noralee Space, RN on: 03/29/2015  2:34 PM<BR>     Documentation filed: Dx [employer], Orders

## 2015-03-29 NOTE — Progress Notes (Signed)
Patient ID: GUNNER MONTALBANO, male   DOB: 1956/05/27, 59 y.o.   MRN: [id number] PCP: Primary Cardiologist: Dr Shirlee Latch EP: Dr Johney Frame  HPI: 59 yo with history of CAD s/p multivessel PCI, ischemic cardiomyopathy with systolic CHF, CKD, St jude ICD, and atrial flutter presented to [facility] in March 2016 with atrial flutter/RVR and acute on chronic systolic CHF. Underwent successful atrial flutter ablation on 3/25. He was lethargic post-extubation so was re-intubated. He then woke up and self-extubated. Amiodarone stopped. Maintaining NSR. Unfortunately he had low output and was discharged on home milrinone at 0.375 mcg.   He returns for follow up for heart failure. Remains on milrinone 0.375 mcg via PICC. Last visit hydralazine was increased to 25 mg tid. Overall feeing much better. Denies SOB/PND. Sleeps with head elevated. Able to perform ADLs. etting dressed. Weight at home down form 280 to 265 pounds. Following low salt diet.  [facility] following. Only walking in the house. Taking all medications.  Actively followed by [facility].   ECHO 01/02/2015 EF 15%   Labs 01/12/2015: k 3.9 Creatinine 1.39 Hgb 11.7 dig level 0.4   ROS: All systems negative except as listed in HPI, PMH and Problem List.  SH:  History   Social History  . Marital Status: Single    Spouse Name: N/A  . Number of Children: N/A  . Years of Education: N/A   Occupational History  . Not on file.   Social History Main Topics  . Smoking status: Never Smoker   . Smokeless tobacco: Not on file  . Alcohol Use: No  . Drug Use: Not on file  . Sexual Activity: Not on file   Other Topics Concern  . Not on file   Social History Narrative    FH:  Family History  Problem Relation Age of Onset  . Family history unknown: Yes    Past Medical History  Diagnosis Date  . Diabetes mellitus without complication   . Atrial flutter with rapid ventricular response   . Acute renal failure   . Vascular dementia   . CHF (congestive heart  failure)   . Delirium   . Ischemic cardiomyopathy   . Septic shock   . Pneumonia   . CAD (coronary artery disease)   . Anemia   . Hypertension   . Hyperlipidemia   . Anxiety   . Gout of hand     Current Outpatient Prescriptions  Medication Sig Dispense Refill  . apixaban (ELIQUIS) 5 MG TABS tablet Take 5 mg by mouth 2 (two) times daily.    Marland Kitchen atorvastatin (LIPITOR) 80 MG tablet Take 40 mg by mouth daily.     . colchicine 0.6 MG tablet Take 1 tablet (0.6 mg total) by mouth daily. 30 tablet 11  . colchicine 0.6 MG tablet Take 1 tablet (0.6 mg total) by mouth once. You may take 2 tabs the first time for gout flare, then 1 tab one hour later. Take 1 tab daily until symptoms resolve 15 tablet 0  . digoxin (LANOXIN) 0.125 MG tablet Take 1 tablet (0.125 mg total) by mouth daily. 30 tablet 11  . docusate sodium (COLACE) 100 MG capsule Take 200 mg by mouth daily.    . hydrALAZINE (APRESOLINE) 25 MG tablet Take 1 tablet (25 mg total) by mouth every 8 (eight) hours. 90 tablet 11  . insulin glargine (LANTUS) 100 UNIT/ML injection Inject 0.4 mLs (40 Units total) into the skin at bedtime. 10 mL 0  . isosorbide mononitrate (IMDUR) 30  MG 24 hr tablet Take 1 tablet (30 mg total) by mouth daily. 30 tablet 11  . milrinone (PRIMACOR) 20 MG/100ML SOLN infusion Inject 50.0625 mcg/min into the vein continuous. 100 mL 3  . oxyCODONE-acetaminophen (ROXICET) 5-325 MG per tablet Take 1 tablet by mouth every 6 (six) hours as needed for pain. 120 tablet 0  . pantoprazole (PROTONIX) 40 MG tablet Take 1 tablet (40 mg total) by mouth daily. 30 tablet 11  . potassium chloride SA (K-DUR,KLOR-CON) 20 MEQ tablet Take 2 tablets (40 mEq total) by mouth 2 (two) times daily. 60 tablet 11  . spironolactone (ALDACTONE) 25 MG tablet Take 1 tablet (25 mg total) by mouth daily. 30 tablet 11  . torsemide (DEMADEX) 20 MG tablet Take 2 tablets (40 mg total) by mouth 2 (two) times daily. 60 tablet 11   No current facility-administered  medications for this encounter.    Filed Vitals:   03/29/15 1352  BP: 116/72  Pulse: 90  Weight: 262 lb (118.842 kg)  SpO2: 98%    PHYSICAL EXAM:  General:  Well appearing. No resp difficulty. Ambulated in the clinic.  HEENT: normal Neck: supple. JVP 5-6. Carotids 2+ bilaterally; no bruits. No lymphadenopathy or thryomegaly appreciated. Cor: PMI normal. Regular rate & rhythm. No rubs, gallops or murmurs. Lungs: clear Abdomen: soft, nontender, nondistended. No hepatosplenomegaly. No bruits or masses. Good bowel sounds. Extremities: no cyanosis, clubbing, rash, edema. LUE PICC double lumen PICC Neuro: alert & orientedx3, cranial nerves grossly intact. Moves all 4 extremities w/o difficulty. Affect pleasant.    ASSESSMENT & PLAN: 1. 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 On milrinone 0.375 mcg via PICC. Check CO-OX today  Continue dig 0.125 mg daily  Volume status stable. Continue torsemide 40 mg po bid, KCl 40 daily.  - Continue Hydralazine  25 mg tid /Imdur 30 mg daily.  - Continue spironolactone 25 mg daily  - No ACEI yet with low BP and recent AKI, no beta blocker with low output.  [facility] following for milrinone.  - He was evaluated for advanced therapies 2 years ago at [facility] and deemed to be a poor candidate (apparently compliance issues and also lives alone). He had decided not to go [place].  2. Atrial flutter: S/P AFL ablation 3/25.  Continue Eliquis 5 mg bid. No bleeding problems.  3. CKD: Renal function improved, suspect now at baseline.  4. CAD: h/o PCI. No evidence of ischemia. On statin and eliquis.  5. Gout: Left hand, better with colchicine. Seems to be stable.   Follow up in 1 month. Check CO-OX and consider cutting back milrinone.  CLEGG,AMY NP-C  2:23 PM

## 2015-04-02 ENCOUNTER — Telehealth (HOSPITAL_COMMUNITY): Payer: Self-pay | Admitting: *Deleted

## 2015-04-02 NOTE — Telephone Encounter (Signed)
recieved labs from [facility], bun 27 cr 1.37 K 4.4, glucose 356, per Tonye Becket, NP labs stable except glucose, needs f/u w/pcp.  Spoke w/pt, he is aware and states his CBG has been running higher at home, he is on lantus, he states the [place] is his pcp but he hasn't seen them in a while, he states he is going to call them and make an appt

## 2015-04-06 ENCOUNTER — Encounter: Payer: Self-pay | Admitting: Internal Medicine

## 2015-04-17 ENCOUNTER — Ambulatory Visit (HOSPITAL_BASED_OUTPATIENT_CLINIC_OR_DEPARTMENT_OTHER)
Admission: RE | Admit: 2015-04-17 | Discharge: 2015-04-17 | Disposition: A | Discharge status: Home / Self Care | Payer: Medicare Other | Source: Ambulatory Visit | Attending: Cardiology | Admitting: Cardiology

## 2015-04-17 ENCOUNTER — Telehealth (HOSPITAL_COMMUNITY): Payer: Self-pay | Admitting: Adult Health

## 2015-04-17 DIAGNOSIS — Z794 Long term (current) use of insulin: Secondary | ICD-10-CM

## 2015-04-17 DIAGNOSIS — Y848 Other medical procedures as the cause of abnormal reaction of the patient, or of later complication, without mention of misadventure at the time of the procedure: Secondary | ICD-10-CM | POA: Diagnosis present

## 2015-04-17 DIAGNOSIS — I4892 Unspecified atrial flutter: Secondary | ICD-10-CM | POA: Diagnosis present

## 2015-04-17 DIAGNOSIS — Z95810 Presence of automatic (implantable) cardiac defibrillator: Secondary | ICD-10-CM

## 2015-04-17 DIAGNOSIS — Z87891 Personal history of nicotine dependence: Secondary | ICD-10-CM

## 2015-04-17 DIAGNOSIS — I129 Hypertensive chronic kidney disease with stage 1 through stage 4 chronic kidney disease, or unspecified chronic kidney disease: Secondary | ICD-10-CM | POA: Diagnosis present

## 2015-04-17 DIAGNOSIS — F4310 Post-traumatic stress disorder, unspecified: Secondary | ICD-10-CM | POA: Diagnosis present

## 2015-04-17 DIAGNOSIS — I255 Ischemic cardiomyopathy: Secondary | ICD-10-CM | POA: Diagnosis present

## 2015-04-17 DIAGNOSIS — Z79899 Other long term (current) drug therapy: Secondary | ICD-10-CM

## 2015-04-17 DIAGNOSIS — E1165 Type 2 diabetes mellitus with hyperglycemia: Secondary | ICD-10-CM | POA: Diagnosis present

## 2015-04-17 DIAGNOSIS — F419 Anxiety disorder, unspecified: Secondary | ICD-10-CM | POA: Diagnosis present

## 2015-04-17 DIAGNOSIS — N183 Chronic kidney disease, stage 3 (moderate): Secondary | ICD-10-CM | POA: Diagnosis present

## 2015-04-17 DIAGNOSIS — R7881 Bacteremia: Secondary | ICD-10-CM | POA: Diagnosis present

## 2015-04-17 DIAGNOSIS — E1122 Type 2 diabetes mellitus with diabetic chronic kidney disease: Secondary | ICD-10-CM | POA: Diagnosis present

## 2015-04-17 DIAGNOSIS — I252 Old myocardial infarction: Secondary | ICD-10-CM

## 2015-04-17 DIAGNOSIS — I5022 Chronic systolic (congestive) heart failure: Secondary | ICD-10-CM | POA: Diagnosis not present

## 2015-04-17 DIAGNOSIS — E1310 Other specified diabetes mellitus with ketoacidosis without coma: Secondary | ICD-10-CM | POA: Diagnosis present

## 2015-04-17 DIAGNOSIS — Z7901 Long term (current) use of anticoagulants: Secondary | ICD-10-CM

## 2015-04-17 DIAGNOSIS — Z881 Allergy status to other antibiotic agents status: Secondary | ICD-10-CM

## 2015-04-17 DIAGNOSIS — E785 Hyperlipidemia, unspecified: Secondary | ICD-10-CM | POA: Diagnosis present

## 2015-04-17 DIAGNOSIS — Z888 Allergy status to other drugs, medicaments and biological substances status: Secondary | ICD-10-CM

## 2015-04-17 DIAGNOSIS — I5043 Acute on chronic combined systolic (congestive) and diastolic (congestive) heart failure: Secondary | ICD-10-CM | POA: Diagnosis present

## 2015-04-17 DIAGNOSIS — I2510 Atherosclerotic heart disease of native coronary artery without angina pectoris: Secondary | ICD-10-CM | POA: Diagnosis present

## 2015-04-17 DIAGNOSIS — T80211A Bloodstream infection due to central venous catheter, initial encounter: Principal | ICD-10-CM | POA: Diagnosis present

## 2015-04-17 DIAGNOSIS — E870 Hyperosmolality and hypernatremia: Secondary | ICD-10-CM | POA: Diagnosis present

## 2015-04-17 DIAGNOSIS — Z79891 Long term (current) use of opiate analgesic: Secondary | ICD-10-CM

## 2015-04-17 DIAGNOSIS — F0150 Vascular dementia without behavioral disturbance: Secondary | ICD-10-CM | POA: Diagnosis present

## 2015-04-17 DIAGNOSIS — M109 Gout, unspecified: Secondary | ICD-10-CM | POA: Diagnosis present

## 2015-04-17 DIAGNOSIS — Z955 Presence of coronary angioplasty implant and graft: Secondary | ICD-10-CM

## 2015-04-17 DIAGNOSIS — Z882 Allergy status to sulfonamides status: Secondary | ICD-10-CM

## 2015-04-17 MED ORDER — MAGNESIUM OXIDE -MG SUPPLEMENT 200 MG PO TABS: 200.0000 mg | ORAL_TABLET | Freq: Two times a day (BID) | ORAL | Status: DC

## 2015-04-17 NOTE — Telephone Encounter (Signed)
   Magnesium - Increase mag oxide 200 mg twice a day.    WBC trending up 10.5>13>14.9  Glucose 405 - [facility] He has been increasing Lantus by 1 unit nightly.   Denies fever, chills. PICC line dressing ok.   He will present today for blood cultures.    Monesha Monreal NP-C  10:39 AM

## 2015-04-18 ENCOUNTER — Encounter (HOSPITAL_COMMUNITY): Payer: Self-pay | Admitting: General Practice

## 2015-04-18 ENCOUNTER — Inpatient Hospital Stay (HOSPITAL_COMMUNITY)
Admission: AD | Admit: 2015-04-18 | Discharge: 2015-04-24 | DRG: 314 | Disposition: A | Discharge status: Home Health | Payer: Medicare Other | Source: Ambulatory Visit | Attending: Cardiology | Admitting: Cardiology

## 2015-04-18 DIAGNOSIS — E1129 Type 2 diabetes mellitus with other diabetic kidney complication: Secondary | ICD-10-CM | POA: Diagnosis not present

## 2015-04-18 DIAGNOSIS — R7881 Bacteremia: Secondary | ICD-10-CM | POA: Diagnosis present

## 2015-04-18 DIAGNOSIS — T827XXD Infection and inflammatory reaction due to other cardiac and vascular devices, implants and grafts, subsequent encounter: Secondary | ICD-10-CM | POA: Diagnosis not present

## 2015-04-18 DIAGNOSIS — E1110 Type 2 diabetes mellitus with ketoacidosis without coma: Secondary | ICD-10-CM | POA: Insufficient documentation

## 2015-04-18 DIAGNOSIS — B9689 Other specified bacterial agents as the cause of diseases classified elsewhere: Secondary | ICD-10-CM | POA: Diagnosis not present

## 2015-04-18 DIAGNOSIS — Z882 Allergy status to sulfonamides status: Secondary | ICD-10-CM | POA: Diagnosis not present

## 2015-04-18 DIAGNOSIS — IMO0002 Reserved for concepts with insufficient information to code with codable children: Secondary | ICD-10-CM | POA: Diagnosis present

## 2015-04-18 DIAGNOSIS — I4892 Unspecified atrial flutter: Secondary | ICD-10-CM | POA: Diagnosis present

## 2015-04-18 DIAGNOSIS — Z95810 Presence of automatic (implantable) cardiac defibrillator: Secondary | ICD-10-CM | POA: Diagnosis not present

## 2015-04-18 DIAGNOSIS — N183 Chronic kidney disease, stage 3 unspecified: Secondary | ICD-10-CM | POA: Diagnosis present

## 2015-04-18 DIAGNOSIS — T827XXA Infection and inflammatory reaction due to other cardiac and vascular devices, implants and grafts, initial encounter: Secondary | ICD-10-CM | POA: Diagnosis present

## 2015-04-18 DIAGNOSIS — Z794 Long term (current) use of insulin: Secondary | ICD-10-CM | POA: Diagnosis not present

## 2015-04-18 DIAGNOSIS — I38 Endocarditis, valve unspecified: Secondary | ICD-10-CM | POA: Diagnosis not present

## 2015-04-18 DIAGNOSIS — Z881 Allergy status to other antibiotic agents status: Secondary | ICD-10-CM | POA: Diagnosis not present

## 2015-04-18 DIAGNOSIS — M10061 Idiopathic gout, right knee: Secondary | ICD-10-CM | POA: Diagnosis not present

## 2015-04-18 DIAGNOSIS — E1165 Type 2 diabetes mellitus with hyperglycemia: Secondary | ICD-10-CM | POA: Diagnosis not present

## 2015-04-18 DIAGNOSIS — T80211A Bloodstream infection due to central venous catheter, initial encounter: Secondary | ICD-10-CM | POA: Diagnosis present

## 2015-04-18 DIAGNOSIS — E1100 Type 2 diabetes mellitus with hyperosmolarity without nonketotic hyperglycemic-hyperosmolar coma (NKHHC): Secondary | ICD-10-CM | POA: Insufficient documentation

## 2015-04-18 DIAGNOSIS — F0150 Vascular dementia without behavioral disturbance: Secondary | ICD-10-CM | POA: Diagnosis present

## 2015-04-18 DIAGNOSIS — E870 Hyperosmolality and hypernatremia: Secondary | ICD-10-CM | POA: Diagnosis present

## 2015-04-18 DIAGNOSIS — F419 Anxiety disorder, unspecified: Secondary | ICD-10-CM | POA: Diagnosis present

## 2015-04-18 DIAGNOSIS — Z87891 Personal history of nicotine dependence: Secondary | ICD-10-CM | POA: Diagnosis not present

## 2015-04-18 DIAGNOSIS — I252 Old myocardial infarction: Secondary | ICD-10-CM | POA: Diagnosis not present

## 2015-04-18 DIAGNOSIS — I2510 Atherosclerotic heart disease of native coronary artery without angina pectoris: Secondary | ICD-10-CM | POA: Diagnosis present

## 2015-04-18 DIAGNOSIS — Y848 Other medical procedures as the cause of abnormal reaction of the patient, or of later complication, without mention of misadventure at the time of the procedure: Secondary | ICD-10-CM | POA: Diagnosis present

## 2015-04-18 DIAGNOSIS — I255 Ischemic cardiomyopathy: Secondary | ICD-10-CM | POA: Diagnosis present

## 2015-04-18 DIAGNOSIS — Z79891 Long term (current) use of opiate analgesic: Secondary | ICD-10-CM | POA: Diagnosis not present

## 2015-04-18 DIAGNOSIS — I5043 Acute on chronic combined systolic (congestive) and diastolic (congestive) heart failure: Secondary | ICD-10-CM | POA: Diagnosis present

## 2015-04-18 DIAGNOSIS — M10071 Idiopathic gout, right ankle and foot: Secondary | ICD-10-CM | POA: Diagnosis not present

## 2015-04-18 DIAGNOSIS — R7309 Other abnormal glucose: Secondary | ICD-10-CM | POA: Diagnosis not present

## 2015-04-18 DIAGNOSIS — E1122 Type 2 diabetes mellitus with diabetic chronic kidney disease: Secondary | ICD-10-CM | POA: Diagnosis present

## 2015-04-18 DIAGNOSIS — B957 Other staphylococcus as the cause of diseases classified elsewhere: Secondary | ICD-10-CM | POA: Diagnosis not present

## 2015-04-18 DIAGNOSIS — Z7901 Long term (current) use of anticoagulants: Secondary | ICD-10-CM | POA: Diagnosis not present

## 2015-04-18 DIAGNOSIS — Z955 Presence of coronary angioplasty implant and graft: Secondary | ICD-10-CM | POA: Diagnosis not present

## 2015-04-18 DIAGNOSIS — M109 Gout, unspecified: Secondary | ICD-10-CM | POA: Diagnosis present

## 2015-04-18 DIAGNOSIS — D72829 Elevated white blood cell count, unspecified: Secondary | ICD-10-CM | POA: Diagnosis not present

## 2015-04-18 DIAGNOSIS — R509 Fever, unspecified: Secondary | ICD-10-CM | POA: Diagnosis not present

## 2015-04-18 DIAGNOSIS — E1101 Type 2 diabetes mellitus with hyperosmolarity with coma: Secondary | ICD-10-CM | POA: Diagnosis not present

## 2015-04-18 DIAGNOSIS — Z888 Allergy status to other drugs, medicaments and biological substances status: Secondary | ICD-10-CM | POA: Diagnosis not present

## 2015-04-18 DIAGNOSIS — Z79899 Other long term (current) drug therapy: Secondary | ICD-10-CM | POA: Diagnosis not present

## 2015-04-18 DIAGNOSIS — E1310 Other specified diabetes mellitus with ketoacidosis without coma: Secondary | ICD-10-CM | POA: Diagnosis not present

## 2015-04-18 DIAGNOSIS — I509 Heart failure, unspecified: Secondary | ICD-10-CM | POA: Diagnosis not present

## 2015-04-18 DIAGNOSIS — E785 Hyperlipidemia, unspecified: Secondary | ICD-10-CM | POA: Diagnosis present

## 2015-04-18 DIAGNOSIS — Z959 Presence of cardiac and vascular implant and graft, unspecified: Secondary | ICD-10-CM | POA: Diagnosis not present

## 2015-04-18 DIAGNOSIS — M1000 Idiopathic gout, unspecified site: Secondary | ICD-10-CM | POA: Diagnosis not present

## 2015-04-18 DIAGNOSIS — N189 Chronic kidney disease, unspecified: Secondary | ICD-10-CM | POA: Diagnosis not present

## 2015-04-18 DIAGNOSIS — I129 Hypertensive chronic kidney disease with stage 1 through stage 4 chronic kidney disease, or unspecified chronic kidney disease: Secondary | ICD-10-CM | POA: Diagnosis present

## 2015-04-18 DIAGNOSIS — F4310 Post-traumatic stress disorder, unspecified: Secondary | ICD-10-CM | POA: Diagnosis present

## 2015-04-18 HISTORY — DX: Acute myocardial infarction, unspecified: I21.9

## 2015-04-18 HISTORY — DX: Personal history of other medical treatment: Z92.89

## 2015-04-18 HISTORY — DX: Long term (current) use of insulin: Z79.4

## 2015-04-18 HISTORY — DX: Panic disorder (episodic paroxysmal anxiety): F41.0

## 2015-04-18 HISTORY — DX: Presence of automatic (implantable) cardiac defibrillator: Z95.810

## 2015-04-18 HISTORY — DX: Reserved for inherently not codable concepts without codable children: IMO0001

## 2015-04-18 HISTORY — DX: Type 2 diabetes mellitus without complications: E11.9

## 2015-04-18 HISTORY — DX: Post-traumatic stress disorder, unspecified: F43.10

## 2015-04-18 HISTORY — DX: Unspecified osteoarthritis, unspecified site: M19.90

## 2015-04-18 LAB — COMPREHENSIVE METABOLIC PANEL
ALBUMIN: 3.2 g/dL — AB (ref 3.5–5.0)
ALT: 16 U/L — ABNORMAL LOW (ref 17–63)
ANION GAP: 14 (ref 5–15)
AST: 19 U/L (ref 15–41)
Alkaline Phosphatase: 130 U/L — ABNORMAL HIGH (ref 38–126)
BUN: 23 mg/dL — AB (ref 6–20)
CALCIUM: 9.2 mg/dL (ref 8.9–10.3)
CO2: 24 mmol/L (ref 22–32)
CREATININE: 1.53 mg/dL — AB (ref 0.61–1.24)
Chloride: 91 mmol/L — ABNORMAL LOW (ref 101–111)
GFR calc non Af Amer: 48 mL/min — ABNORMAL LOW (ref >60)
GFR, EST AFRICAN AMERICAN: 56 mL/min — AB (ref >60)
Glucose, Bld: 597 mg/dL (ref 65–99)
Potassium: 4.3 mmol/L (ref 3.5–5.1)
Sodium: 129 mmol/L — ABNORMAL LOW (ref 135–145)
TOTAL PROTEIN: 7.9 g/dL (ref 6.5–8.1)
Total Bilirubin: 0.7 mg/dL (ref 0.3–1.2)

## 2015-04-18 LAB — GLUCOSE, CAPILLARY
Glucose-Capillary: 548 mg/dL — ABNORMAL HIGH (ref 65–99)
Glucose-Capillary: 334 mg/dL — ABNORMAL HIGH (ref 65–99)

## 2015-04-18 LAB — CBC WITH DIFFERENTIAL/PLATELET
BASOS PCT: 0 % (ref 0–1)
Basophils Absolute: 0 10*3/uL (ref 0.0–0.1)
Eosinophils Absolute: 0.2 10*3/uL (ref 0.0–0.7)
Eosinophils Relative: 2 % (ref 0–5)
HCT: 39.6 % (ref 39.0–52.0)
HEMOGLOBIN: 13.4 g/dL (ref 13.0–17.0)
Lymphocytes Relative: 13 % (ref 12–46)
Lymphs Abs: 1.9 10*3/uL (ref 0.7–4.0)
MCH: 26.1 pg (ref 26.0–34.0)
MCHC: 33.8 g/dL (ref 30.0–36.0)
MCV: 77 fL — ABNORMAL LOW (ref 78.0–100.0)
MONO ABS: 0.5 10*3/uL (ref 0.1–1.0)
Monocytes Relative: 4 % (ref 3–12)
NEUTROS PCT: 81 % — AB (ref 43–77)
Neutro Abs: 11.8 10*3/uL — ABNORMAL HIGH (ref 1.7–7.7)
Platelets: 289 10*3/uL (ref 150–400)
RBC: 5.14 MIL/uL (ref 4.22–5.81)
RDW: 13.8 % (ref 11.5–15.5)
WBC: 14.5 10*3/uL — ABNORMAL HIGH (ref 4.0–10.5)

## 2015-04-18 LAB — BASIC METABOLIC PANEL
Anion gap: 10 (ref 5–15)
BUN: 25 mg/dL — ABNORMAL HIGH (ref 6–20)
CHLORIDE: 95 mmol/L — AB (ref 101–111)
CO2: 29 mmol/L (ref 22–32)
Calcium: 9.1 mg/dL (ref 8.9–10.3)
Creatinine, Ser: 1.35 mg/dL — ABNORMAL HIGH (ref 0.61–1.24)
GFR calc Af Amer: >60 mL/min (ref >60)
GFR, EST NON AFRICAN AMERICAN: 56 mL/min — AB (ref >60)
GLUCOSE: 214 mg/dL — AB (ref 65–99)
POTASSIUM: 3.7 mmol/L (ref 3.5–5.1)
SODIUM: 134 mmol/L — AB (ref 135–145)

## 2015-04-18 LAB — TSH: TSH: 1.945 u[IU]/mL (ref 0.350–4.500)

## 2015-04-18 LAB — DIGOXIN LEVEL: DIGOXIN LVL: 0.4 ng/mL — AB (ref 0.8–2.0)

## 2015-04-18 LAB — MAGNESIUM: Magnesium: 2.1 mg/dL (ref 1.7–2.4)

## 2015-04-18 MED ORDER — ISOSORBIDE MONONITRATE ER 30 MG PO TB24
30.0000 mg | ORAL_TABLET | Freq: Every day | ORAL | Status: DC
  Administered 2015-04-19 – 2015-04-24 (×6): 30 mg via ORAL
  Filled 2015-04-18 (×6): qty 1

## 2015-04-18 MED ORDER — ATORVASTATIN CALCIUM 40 MG PO TABS
40.0000 mg | ORAL_TABLET | Freq: Every day | ORAL | Status: DC
  Administered 2015-04-19 – 2015-04-24 (×6): 40 mg via ORAL
  Filled 2015-04-18 (×6): qty 1

## 2015-04-18 MED ORDER — SODIUM CHLORIDE 0.9 % IV SOLN
INTRAVENOUS | Status: DC
  Administered 2015-04-18: 19:00:00 via INTRAVENOUS

## 2015-04-18 MED ORDER — COLCHICINE 0.6 MG PO TABS: 0.6000 mg | ORAL_TABLET | Freq: Every day | ORAL | Status: DC

## 2015-04-18 MED ORDER — SODIUM CHLORIDE 0.9 % IV SOLN: 250.0000 mL | INTRAVENOUS | Status: DC | PRN

## 2015-04-18 MED ORDER — POTASSIUM CHLORIDE CRYS ER 20 MEQ PO TBCR
40.0000 meq | EXTENDED_RELEASE_TABLET | Freq: Two times a day (BID) | ORAL | Status: DC
  Administered 2015-04-18 – 2015-04-23 (×11): 40 meq via ORAL
  Filled 2015-04-18 (×15): qty 2

## 2015-04-18 MED ORDER — MILRINONE IN DEXTROSE 20 MG/100ML IV SOLN
0.3750 ug/kg/min | INTRAVENOUS | Status: DC
  Administered 2015-04-18 – 2015-04-24 (×18): 0.375 ug/kg/min via INTRAVENOUS
  Filled 2015-04-18 (×20): qty 100

## 2015-04-18 MED ORDER — INSULIN ASPART 100 UNIT/ML ~~LOC~~ SOLN: 0.0000 [IU] | Freq: Three times a day (TID) | SUBCUTANEOUS | Status: DC

## 2015-04-18 MED ORDER — DIGOXIN 125 MCG PO TABS
0.1250 mg | ORAL_TABLET | Freq: Every day | ORAL | Status: DC
  Administered 2015-04-19 – 2015-04-24 (×6): 0.125 mg via ORAL
  Filled 2015-04-18 (×7): qty 1

## 2015-04-18 MED ORDER — ONDANSETRON HCL 4 MG/2ML IJ SOLN
4.0000 mg | Freq: Four times a day (QID) | INTRAMUSCULAR | Status: DC | PRN
  Administered 2015-04-22: 4 mg via INTRAVENOUS
  Filled 2015-04-18: qty 2

## 2015-04-18 MED ORDER — DOCUSATE SODIUM 100 MG PO CAPS
200.0000 mg | ORAL_CAPSULE | Freq: Every day | ORAL | Status: DC
Start: 2015-04-19 — End: 2015-04-24
  Administered 2015-04-19 – 2015-04-22 (×4): 200 mg via ORAL
  Filled 2015-04-18 (×7): qty 2

## 2015-04-18 MED ORDER — ACETAMINOPHEN 325 MG PO TABS: 650.0000 mg | ORAL_TABLET | ORAL | Status: DC | PRN

## 2015-04-18 MED ORDER — SODIUM CHLORIDE 0.9 % IJ SOLN
3.0000 mL | Freq: Two times a day (BID) | INTRAMUSCULAR | Status: DC
  Administered 2015-04-18 – 2015-04-23 (×6): 3 mL via INTRAVENOUS

## 2015-04-18 MED ORDER — DEXTROSE-NACL 5-0.45 % IV SOLN
INTRAVENOUS | Status: DC
  Administered 2015-04-18: 23:00:00 via INTRAVENOUS

## 2015-04-18 MED ORDER — TORSEMIDE 20 MG PO TABS
40.0000 mg | ORAL_TABLET | Freq: Two times a day (BID) | ORAL | Status: DC
  Administered 2015-04-18 – 2015-04-23 (×10): 40 mg via ORAL
  Filled 2015-04-18 (×12): qty 2

## 2015-04-18 MED ORDER — VANCOMYCIN HCL 10 G IV SOLR
2000.0000 mg | Freq: Once | INTRAVENOUS | Status: AC
  Administered 2015-04-18: 2000 mg via INTRAVENOUS
  Filled 2015-04-18: qty 2000

## 2015-04-18 MED ORDER — VANCOMYCIN HCL 10 G IV SOLR
1500.0000 mg | INTRAVENOUS | Status: DC
  Administered 2015-04-19 – 2015-04-20 (×2): 1500 mg via INTRAVENOUS
  Filled 2015-04-18 (×3): qty 1500

## 2015-04-18 MED ORDER — DEXTROSE 50 % IV SOLN: 25.0000 mL | INTRAVENOUS | Status: DC | PRN

## 2015-04-18 MED ORDER — APIXABAN 5 MG PO TABS
5.0000 mg | ORAL_TABLET | Freq: Two times a day (BID) | ORAL | Status: DC
  Administered 2015-04-18 – 2015-04-24 (×12): 5 mg via ORAL
  Filled 2015-04-18 (×13): qty 1

## 2015-04-18 MED ORDER — PANTOPRAZOLE SODIUM 40 MG PO TBEC
40.0000 mg | DELAYED_RELEASE_TABLET | Freq: Every day | ORAL | Status: DC
  Administered 2015-04-19 – 2015-04-24 (×6): 40 mg via ORAL
  Filled 2015-04-18 (×5): qty 1

## 2015-04-18 MED ORDER — INSULIN GLARGINE 100 UNIT/ML ~~LOC~~ SOLN: 40.0000 [IU] | Freq: Every day | SUBCUTANEOUS | Status: DC

## 2015-04-18 MED ORDER — INSULIN REGULAR BOLUS VIA INFUSION
0.0000 [IU] | Freq: Three times a day (TID) | INTRAVENOUS | Status: DC
  Filled 2015-04-18: qty 10

## 2015-04-18 MED ORDER — MAGNESIUM OXIDE 400 (241.3 MG) MG PO TABS
200.0000 mg | ORAL_TABLET | Freq: Two times a day (BID) | ORAL | Status: DC
Start: 2015-04-18 — End: 2015-04-24
  Administered 2015-04-18 – 2015-04-24 (×12): 200 mg via ORAL
  Filled 2015-04-18 (×14): qty 0.5

## 2015-04-18 MED ORDER — INSULIN ASPART 100 UNIT/ML ~~LOC~~ SOLN
25.0000 [IU] | Freq: Once | SUBCUTANEOUS | Status: AC
  Administered 2015-04-18: 25 [IU] via SUBCUTANEOUS

## 2015-04-18 MED ORDER — OXYCODONE-ACETAMINOPHEN 5-325 MG PO TABS
1.0000 | ORAL_TABLET | Freq: Four times a day (QID) | ORAL | Status: DC | PRN
  Administered 2015-04-19 – 2015-04-24 (×15): 1 via ORAL
  Filled 2015-04-18 (×16): qty 1

## 2015-04-18 MED ORDER — SODIUM CHLORIDE 0.9 % IJ SOLN: 3.0000 mL | INTRAMUSCULAR | Status: DC | PRN

## 2015-04-18 MED ORDER — SPIRONOLACTONE 25 MG PO TABS
25.0000 mg | ORAL_TABLET | Freq: Every day | ORAL | Status: DC
  Administered 2015-04-19 – 2015-04-24 (×6): 25 mg via ORAL
  Filled 2015-04-18 (×6): qty 1

## 2015-04-18 MED ORDER — SODIUM CHLORIDE 0.9 % IV SOLN
INTRAVENOUS | Status: DC
  Administered 2015-04-18: 2.7 [IU]/h via INTRAVENOUS
  Administered 2015-04-18: 4.9 [IU]/h via INTRAVENOUS
  Administered 2015-04-18: 1.7 [IU]/h via INTRAVENOUS
  Administered 2015-04-18: 1.9 [IU]/h via INTRAVENOUS
  Administered 2015-04-18: 3.5 [IU]/h via INTRAVENOUS
  Administered 2015-04-19: 2.7 [IU]/h via INTRAVENOUS
  Administered 2015-04-19: 4.2 [IU]/h via INTRAVENOUS
  Administered 2015-04-19: 6 [IU]/h via INTRAVENOUS
  Administered 2015-04-19: 4.3 [IU]/h via INTRAVENOUS
  Administered 2015-04-19: 2.5 [IU]/h via INTRAVENOUS
  Administered 2015-04-19: 5.7 [IU]/h via INTRAVENOUS
  Administered 2015-04-19: 4.9 [IU]/h via INTRAVENOUS
  Administered 2015-04-19: 5.8 [IU]/h via INTRAVENOUS
  Administered 2015-04-19: 6.6 [IU]/h via INTRAVENOUS
  Filled 2015-04-18: qty 2.5

## 2015-04-18 MED ORDER — HYDRALAZINE HCL 25 MG PO TABS
25.0000 mg | ORAL_TABLET | Freq: Three times a day (TID) | ORAL | Status: DC
  Administered 2015-04-18 – 2015-04-20 (×5): 25 mg via ORAL
  Filled 2015-04-18 (×8): qty 1

## 2015-04-18 MED ORDER — ENSURE ENLIVE PO LIQD: 237.0000 mL | Freq: Two times a day (BID) | ORAL | Status: DC

## 2015-04-18 NOTE — Consult Note (Signed)
[facility] Consultation note                                                                                    Daniel Blankenship, is a 59 y.o. male  MRN: [id number]   DOB - 1956-07-17  Admit Date - 04/18/2015  Outpatient Primary MD for the patient is a [place] [facility]  Referring MD: Shirlee Latch / Cardiology  Reason for consultation: Uncontrolled diabetes with CBG greater than 600  Other consulting MD: Campbell/infectious disease  With History of -  Past Medical History  Diagnosis Date  . Atrial flutter with rapid ventricular response   . Vascular dementia   . CHF (congestive heart failure)   . Delirium   . Ischemic cardiomyopathy   . Septic shock 12/2014  . CAD (coronary artery disease)   . Anemia   . Hypertension   . Hyperlipidemia   . Gout of hand   . [name] (automatic cardioverter/defibrillator) present   . Myocardial infarction 1979?  Marland Kitchen Pneumonia     "maybe 3 times" (04/18/2015)  . IDDM (insulin dependent diabetes mellitus)   . History of blood transfusion 1982    "related to crushed leg/knee"  . Arthritis     "hands, knees, ankles" (04/18/2015)  . Anxiety   . Panic attacks   . Acute renal failure   . 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"      Past Surgical History  Procedure Laterality Date  . Cardioversion    . Cardiac defibrillator placement  ~ 2013  . Atrial flutter ablation N/A 01/05/2015    Procedure: ATRIAL FLUTTER ABLATION;  Surgeon: Hillis Range, MD;  Location: [facility] CATH LAB;  Service: Cardiovascular;  Laterality: N/A;  . Tee without cardioversion N/A 01/05/2015    Procedure: TRANSESOPHAGEAL ECHOCARDIOGRAM (TEE);  Surgeon: Laurey Morale, MD;  Location: [facility] ENDOSCOPY;  Service: Cardiovascular;  Laterality: N/A;  . Appendectomy    . Tibia fracture surgery Left 1982    "crushed my thigh/leg/knee"  . Patella reconstruction Left 1982  . Ankle fracture surgery Bilateral 1980's  . Joint replacement    . Fracture surgery    . Coronary  angioplasty with stent placement  ~ 2013    in for GPC bacteremia in patient with known PIC line; new finding of uncontrolled diabetes with CBG greater than 600    HPI This is a 59 year old male patient known history of CAD, ischemic cardiomyopathy with EF 15%, status post [employer]. Jude ICD and on chronic IV milrinone therapy through PICC line. Prior history of atrial flutter with failed ablation treatment in March 2016. He was admitted today by the heart failure team after being found to have 1 out of 2 blood cultures positive for gram-positive cocci. According to the H&P, over the past several weeks patient's white count has been trending upward based on home health labs: 10,000, 13,000 and 14,000. Patient denied fevers or chills. Patient also reported over 1-1/2 weeks CBGs trending upward. He checked his glucometer to ensure this was functional and had no problems found. He denied changes in medications or nonadherence to medications or diet. He reports continued successful weight  loss in regards to his CHF symptoms with weight currently down to 266 pounds at home. [facility] patient's Lantus dosage somewhat without any improvement in his symptoms. At time of evaluation electrolyte panel was pending so it is unknown if patient has an elevated anion gap are not with CBG greater than 600 obtained after arrival. He is already being given 25 units of NovoLog insulin subcutaneous prior to consultation request.   Review of Systems   In addition to the HPI above,  No Fever-chills, myalgias or other constitutional symptoms No Headache, changes with Vision or hearing, new weakness, tingling, numbness in any extremity, No problems swallowing food or Liquids, indigestion/reflux No Chest pain, Cough or Shortness of Breath, palpitations, orthopnea or DOE No Abdominal pain, N/V; no melena or hematochezia, no dark tarry stools, Bowel  movements are regular, No dysuria, hematuria or flank pain No new skin rashes, lesions, masses or bruises, No new joints pains-aches No polyuria, polydypsia or polyphagia,  *A full 10 point Review of Systems was done, except as stated above, all other Review of Systems were negative.  Social History History  Substance Use Topics  . Smoking status: Former Smoker -- 1.50 packs/day for 14 years    Types: Cigarettes  . Smokeless tobacco: Never Used     Comment: 04/18/2015 "stopped smoking in ~ 2000"  . Alcohol Use: Yes     Comment: 04/18/2015 "stopped drinking in the 1980's    Resides at: Private residence  Lives with: Alone  Ambulatory status: Without assistive devices   Family History Family History  Problem Relation Age of Onset  .  patient denies significant family problems that would be contributory to current admission      Prior to Admission medications   Medication Sig Start Date End Date Taking? Authorizing Provider  apixaban (ELIQUIS) 5 MG TABS tablet Take 5 mg by mouth 2 (two) times daily.    Historical Provider, MD  atorvastatin (LIPITOR) 80 MG tablet Take 40 mg by mouth daily.     Historical Provider, MD  colchicine 0.6 MG tablet Take 1 tablet (0.6 mg total) by mouth daily. 01/12/15   Rhonda G Barrett, PA-C  colchicine 0.6 MG tablet Take 1 tablet (0.6 mg total) by mouth once. You may take 2 tabs the first time for gout flare, then 1 tab one hour later. Take 1 tab daily until symptoms resolve 03/12/15   Junius Finner, PA-C  digoxin (LANOXIN) 0.125 MG tablet Take 1 tablet (0.125 mg total) by mouth daily. 01/12/15   Rhonda G Barrett, PA-C  docusate sodium (COLACE) 100 MG capsule Take 200 mg by mouth daily.    Historical Provider, MD  hydrALAZINE (APRESOLINE) 25 MG tablet Take 1 tablet (25 mg total) by mouth every 8 (eight) hours. 03/01/15   Dolores Patty, MD  insulin glargine (LANTUS) 100 UNIT/ML injection Inject 0.4 mLs (40 Units total) into the skin at bedtime. 01/12/15    Rhonda G Barrett, PA-C  isosorbide mononitrate (IMDUR) 30 MG 24 hr tablet Take 1 tablet (30 mg total) by mouth daily. 01/12/15   Rhonda G Barrett, PA-C  Magnesium Oxide 200 MG TABS Take 1 tablet (200 mg total) by mouth 2 (two) times daily. 04/17/15   Amy D Filbert Schilder, NP  milrinone (PRIMACOR) 20 MG/100ML SOLN infusion Inject 50.0625 mcg/min into the vein continuous. 01/12/15   Rhonda G Barrett, PA-C  oxyCODONE-acetaminophen (ROXICET) 5-325 MG per tablet Take 1 tablet  by mouth every 6 (six) hours as needed for pain. 04/14/13   Tiffany L Reed, DO  pantoprazole (PROTONIX) 40 MG tablet Take 1 tablet (40 mg total) by mouth daily. 01/12/15   Rhonda G Barrett, PA-C  potassium chloride SA (K-DUR,KLOR-CON) 20 MEQ tablet Take 2 tablets (40 mEq total) by mouth 2 (two) times daily. 01/12/15   Rhonda G Barrett, PA-C  spironolactone (ALDACTONE) 25 MG tablet Take 1 tablet (25 mg total) by mouth daily. 01/12/15   Rhonda G Barrett, PA-C  torsemide (DEMADEX) 20 MG tablet Take 2 tablets (40 mg total) by mouth 2 (two) times daily. 01/12/15   Joline Salthonda G Barrett, PA-C    Allergies  Allergen Reactions  . Sulfa Antibiotics     unknown  . Sulfamethoxazole     unknown  . Zocor [Simvastatin]     unknown  . Lisinopril Rash    Other reaction(s): Angioedema (ALLERGY/intolerance)    Physical Exam  Vitals  Blood pressure 131/87, pulse 120, temperature 97.8 F (36.6 C), temperature source Oral, resp. rate 20, height 6\' 3"  (1.905 m), weight 266 lb 14.4 oz (121.065 kg), SpO2 99 %.   General:  In no acute distress, appears healthy and well nourished  Psych:  Normal affect, Denies Suicidal or Homicidal ideations, Awake Alert, Oriented X 3. Speech and thought patterns are clear and appropriate, no apparent short term memory deficits  Neuro:   No focal neurological deficits, CN II through XII intact, Strength 5/5 all 4 extremities, Sensation intact all 4 extremities.  ENT:  Ears and Eyes appear Normal, Conjunctivae clear, PER. Moist oral  mucosa without erythema or exudates.  Neck:  Supple, No lymphadenopathy appreciated  Respiratory:  Symmetrical chest wall movement, Good air movement bilaterally, CTAB. Room Air  Cardiac:  RRR, No Murmurs, no LE edema noted, no JVD, No carotid bruits, peripheral pulses palpable at 2+  Abdomen:  Positive bowel sounds, Soft, Non tender, Non distended,  No masses appreciated, no obvious hepatosplenomegaly  Skin:  No Cyanosis, Normal Skin Turgor, No Skin Rash or Bruise. PICC line site left antecubital unremarkable  Extremities: Symmetrical without obvious trauma or injury,  no effusions.  Data Review  CBC  Recent Labs Lab 04/18/15 1600  WBC 14.5*  HGB 13.4  HCT 39.6  PLT 289  MCV 77.0*  MCH 26.1  MCHC 33.8  RDW 13.8  LYMPHSABS 1.9  MONOABS 0.5  EOSABS 0.2  BASOSABS 0.0    Chemistries  No results for input(s): NA, K, CL, CO2, GLUCOSE, BUN, CREATININE, CALCIUM, MG, AST, ALT, ALKPHOS, BILITOT in the last 168 hours.  Invalid input(s): GFRCGP  CrCl cannot be calculated (Patient has no serum creatinine result on file.).  No results for input(s): TSH, T4TOTAL, T3FREE, THYROIDAB in the last 72 hours.  Invalid input(s): FREET3  Coagulation profile No results for input(s): INR, PROTIME in the last 168 hours.  No results for input(s): DDIMER in the last 72 hours.  Cardiac Enzymes No results for input(s): CKMB, TROPONINI, MYOGLOBIN in the last 168 hours.  Invalid input(s): CK  Invalid input(s): POCBNP  Urinalysis    Component Value Date/Time   COLORURINE ORANGE* 12/31/2014 1445   APPEARANCEUR CLOUDY* 12/31/2014 1445   LABSPEC 1.023 12/31/2014 1445   PHURINE 5.0 12/31/2014 1445   GLUCOSEU 500* 12/31/2014 1445   HGBUR NEGATIVE 12/31/2014 1445   BILIRUBINUR MODERATE* 12/31/2014 1445   KETONESUR 15* 12/31/2014 1445   PROTEINUR NEGATIVE 12/31/2014 1445   UROBILINOGEN 1.0 12/31/2014 1445   NITRITE NEGATIVE 12/31/2014 1445  LEUKOCYTESUR NEGATIVE 12/31/2014 1445     Imaging results:   No results found.     Assessment & Plan  Principal Problem:   DM type 2, uncontrolled, with renal complications -Admitted to telemetry by primary heart failure team -CBG greater than 600 so we'll begin with IV insulin infusion -Transition to Lantus either after anion gap closed or once CBG less than or equal to 250 on insulin infusion in AG not elevated initially -Follow up on electrolyte panel to determine anion gap -NPO if requires insulin infusion in setting of elevated anion gap; allow diet once anion gap is closed -Given EF of 15% will only allow fluids at keep open rate -Check hemoglobin A1c; was markedly elevated in March 2016 at 12.9  Active Problems:   CKD (chronic kidney disease), stage III -Renal function stable and at baseline 18/1.39    Bacteremia associated with intravascular line -To be managed by infectious disease service/Dr. Orvan Falconer    CAD/Acute on chronic systolic and diastolic heart failure, NYHA class 4  -To be managed by the primary team   Condition:  Stable  Discharge disposition: At discretion of primary team  Time spent in minutes : 60      ELLIS,ALLISON L. ANP on 04/18/2015 at 5:18 PM  Between 7am to 7pm - Pager - [phone]  After 7pm go to [email or web address] - password [email or web address]  And look for the night coverage person covering me after hours  [facility]  Patient was seen, examined,treatment plan was discussed with the Advance Practice Provider.  I have directly reviewed the clinical findings, lab, imaging studies and management of this patient in detail. I have made the necessary changes to the above noted documentation, and agree with the documentation, as recorded by the Advance Practice Provider.   Pamella Pert, MD [facility] [phone]

## 2015-04-18 NOTE — Consult Note (Signed)
[facility]    Date of Admission:  04/18/2015            Day 1 vancomycin       Reason for Consult: Positive blood culture    Referring Physician: Dr. Marca Anconaalton McLean  Principal Problem:   Bacteremia associated with intravascular line Active Problems:   CAD (coronary artery disease)   DM type 2, uncontrolled, with renal complications   Acute on chronic systolic and diastolic heart failure, NYHA class 4   CKD (chronic kidney disease), stage III   . apixaban  5 mg Oral BID  . atorvastatin  40 mg Oral Daily  . colchicine  0.6 mg Oral Daily  . digoxin  0.125 mg Oral Daily  . docusate sodium  200 mg Oral Daily  . feeding supplement (ENSURE ENLIVE)  237 mL Oral BID BM  . hydrALAZINE  25 mg Oral 3 times per day  . insulin aspart  0-20 Units Subcutaneous TID WC  . [START ON 04/19/2015] insulin regular  0-10 Units Intravenous TID WC  . isosorbide mononitrate  30 mg Oral Daily  . magnesium oxide  200 mg Oral BID  . pantoprazole  40 mg Oral Daily  . potassium chloride SA  40 mEq Oral BID  . sodium chloride  3 mL Intravenous Q12H  . spironolactone  25 mg Oral Daily  . torsemide  40 mg Oral BID  . vancomycin  2,000 mg Intravenous Once    Recommendations: 1. Continue vancomycin pending final blood culture results 2. Repeat blood cultures in 24-48 hours 3. Remove PICC and, if possible, leave out until blood cultures are negative 4. Transthoracic echocardiogram   Assessment: I suspect that the one positive blood culture represents true bacteremia from his PICC. His increasing leukocytosis and hyperglycemia are probably do to his infection. He has already started receiving IV vancomycin. He is at risk for native valve endocarditis and infection of his defibrillator leads.   HPI: Daniel Blankenship is a 59 y.o. male with heart failure who has been receiving outpatient IV milrinone through a left arm PICC. He has not had any fever, chills or sweats but  one week ago he began to note that his blood sugars were much higher than normal. One lumen of his PICC clotted around that same time and his white blood count has have been steadily increasing. Blood cultures were obtained yesterday and one of 2 cultures already growing gram-positive cocci in clusters leading to admission this afternoon.   Review of Systems: Pertinent items are noted in HPI.  Past Medical History  Diagnosis Date  . Atrial flutter with rapid ventricular response   . Vascular dementia   . CHF (congestive heart failure)   . Delirium   . Ischemic cardiomyopathy   . Septic shock 12/2014  . CAD (coronary artery disease)   . Anemia   . Hypertension   . Hyperlipidemia   . Gout of hand   . [name] (automatic cardioverter/defibrillator) present   . Myocardial infarction 1979?  Marland Kitchen. Pneumonia     "maybe 3 times" (04/18/2015)  . IDDM (insulin dependent diabetes mellitus)   . History of blood transfusion 1982    "related to crushed leg/knee"  . Arthritis     "hands, knees, ankles" (04/18/2015)  . Anxiety   . Panic attacks   . Acute renal failure   . 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 Smoking status: Former Smoker -- 1.50 packs/day for 14 years    Types: Cigarettes  . Smokeless tobacco: Never Used     Comment: 04/18/2015 "stopped smoking in ~ 2000"  . Alcohol Use: Yes     Comment: 04/18/2015 "stopped drinking in the 1980's    Family History  Problem Relation Age of Onset  . Family history unknown: Yes   Allergies  Allergen Reactions  . Sulfa Antibiotics     unknown  . Sulfamethoxazole     unknown  . Zocor [Simvastatin]     unknown  . Lisinopril Rash    Other reaction(s): Angioedema (ALLERGY/intolerance)    OBJECTIVE: Blood pressure 131/87, pulse 120, temperature 97.8 F (36.6 C), temperature source Oral, resp. rate 20, height  (1.905 m), weight 266 lb 14.4 oz (121.065 kg), SpO2 99 %. General: he is  alert, talkative and in no distress Skin: no rash, splinter or conjunctival hemorrhages Chest: Right anterior chest [name] site appears normal without obvious infection Lungs: clear Cor: tachycardic but regular S1 and S2 with no murmurs heard Abdomen: obese, soft and nontender Extremities: Left arm PICC site appears normal. Old, healed scar on left knee  Lab Results Lab Results  Component Value Date   WBC 14.5* 04/18/2015   HGB 13.4 04/18/2015   HCT 39.6 04/18/2015   MCV 77.0* 04/18/2015   PLT 289 04/18/2015    Lab Results  Component Value Date   CREATININE 1.39* 01/12/2015   BUN 18 01/12/2015   NA 135 01/12/2015   K 3.9 01/12/2015   CL 98 01/12/2015   CO2 30 01/12/2015    Lab Results  Component Value Date   ALT 46 01/11/2015   AST 23 01/11/2015   ALKPHOS 97 01/11/2015   BILITOT 2.3* 01/11/2015     Microbiology: Recent Results (from the past 240 hour(s))  Culture, blood (routine x 2)     Status: None (Preliminary result)   Collection Time: 04/17/15 11:25 AM  Result Value Ref Range Status   Specimen Description BLOOD RIGHT ARM  Final   Special Requests BOTTLES DRAWN AEROBIC AND ANAEROBIC 10CC  Final   Culture NO GROWTH 1 DAY  Final   Report Status PENDING  Incomplete  Culture, blood (routine x 2)     Status: None (Preliminary result)   Collection Time: 04/17/15 11:25 AM  Result Value Ref Range Status   Specimen Description BLOOD LEFT ARM  Final   Special Requests BOTTLES DRAWN AEROBIC AND ANAEROBIC 10CC  Final   Culture  Setup Time   Final    GRAM POSITIVE COCCI IN CLUSTERS AEROBIC BOTTLE ONLY CRITICAL RESULT CALLED TO, READ BACK BY AND VERIFIED WITH: Margrett Rud AT 1149 04/18/15 BY L BENFIELD    Culture GRAM POSITIVE COCCI  Final   Report Status PENDING  Incomplete    Cliffton Asters, MD [facility] [phone] pager   [phone] cell 04/18/2015, 5:22 PM

## 2015-04-18 NOTE — Progress Notes (Signed)
MD notified of lab calling RN concerning pt glucose of 597 as well as pt rechecked CBG results. MD said to start insulin drip once approved by pharmacy. Pt asymptomatic; will closely monitor. P. Amo Abbiegail Landgren RN. 

## 2015-04-18 NOTE — Progress Notes (Addendum)
Pharmacy CONSULT NOTE - INITIAL  Pharmacy Consult for vancomycin, Eliquis Indication: bacteremia, aflutter  Allergies  Allergen Reactions  . Sulfa Antibiotics     unknown  . Sulfamethoxazole     unknown  . Zocor [Simvastatin]     unknown  . Lisinopril Rash    Other reaction(s): Angioedema (ALLERGY/intolerance)    Patient Measurements:   Adjusted Body Weight: est 118 kg  Vital Signs:   Intake/Output from previous day:   Intake/Output from this shift:    Labs: No results for input(s): WBC, HGB, PLT, LABCREA, CREATININE in the last 72 hours. CrCl cannot be calculated (Unknown ideal weight.). No results for input(s): VANCOTROUGH, VANCOPEAK, VANCORANDOM, GENTTROUGH, GENTPEAK, GENTRANDOM, TOBRATROUGH, TOBRAPEAK, TOBRARND, AMIKACINPEAK, AMIKACINTROU, AMIKACIN in the last 72 hours.   Microbiology: Recent Results (from the past 720 hour(s))  Culture, blood (routine x 2)     Status: None (Preliminary result)   Collection Time: 04/17/15 11:25 AM  Result Value Ref Range Status   Specimen Description BLOOD RIGHT ARM  Final   Special Requests BOTTLES DRAWN AEROBIC AND ANAEROBIC 10CC  Final   Culture NO GROWTH 1 DAY  Final   Report Status PENDING  Incomplete  Culture, blood (routine x 2)     Status: None (Preliminary result)   Collection Time: 04/17/15 11:25 AM  Result Value Ref Range Status   Specimen Description BLOOD LEFT ARM  Final   Special Requests BOTTLES DRAWN AEROBIC AND ANAEROBIC 10CC  Final   Culture  Setup Time   Final    GRAM POSITIVE COCCI IN CLUSTERS AEROBIC BOTTLE ONLY CRITICAL RESULT CALLED TO, READ BACK BY AND VERIFIED WITH: Margrett RudM BRADLEY,RN AT 1149 04/18/15 BY L BENFIELD    Culture GRAM POSITIVE COCCI  Final   Report Status PENDING  Incomplete    Medical History: Past Medical History  Diagnosis Date  . Diabetes mellitus without complication   . Atrial flutter with rapid ventricular response   . Acute renal failure   . Vascular dementia   . CHF (congestive  heart failure)   . Delirium   . Ischemic cardiomyopathy   . Septic shock   . Pneumonia   . CAD (coronary artery disease)   . Anemia   . Hypertension   . Hyperlipidemia   . Anxiety   . Gout of hand     Assessment: 59 yo male with hx home milrinone, and PICC line.  Now with blood cultures positive for GPC in 1 of 2.  Line to be pulled and pharmacy asked to add empiric vancomycin.  Also with hx aflutter, on chronic Eliquis 5 mg BID.  Goal of Therapy:  Vancomycin trough level 15-20 mcg/ml  Plan:  1. Vancomycin 2g x 1 now.  Await labs for further dosing. 2. Continue Eliquis 5 mg BID.  Tad MooreJessica Carney, Pharm D, BCPS  Clinical Pharmacist Pager [phone]  04/18/2015 2:49 PM    Addendum:  SCr 1.53, estimated CrCl ~ 50 ml/min.  Will start Vancomycin 1500 mg IV q24h - next dose due 7/7 @ 1600.  Toys 'R' UsKimberly Elsi Stelzer, Pharm.D., BCPS Clinical Pharmacist Pager [phone] 04/18/2015 7:02 PM

## 2015-04-18 NOTE — Progress Notes (Signed)
Pt arrived to the unit as a direct admit from home; Pt A&O x4; oriented to the unit and room telemetry applied and verified; skin intact; PICC intact and infusing; 2 peripheral IV established; IV team paged to removed PICC as ordered but unable to yet d/t awaiting on pharmacy to send Milrinone for pt first; pt CBG >600 (checked twice); NP Clegg notified and she said she will be up to see pt. Pt in bed with call light within reach. Will closely monitor. Arabella MerlesP. Amo Harlie Buening RN.

## 2015-04-18 NOTE — Progress Notes (Signed)
Pt stable in room; insulin drip started; reported off to incoming RN. Arabella MerlesP. Amo Candies Palm RN.

## 2015-04-18 NOTE — H&P (Signed)
Advanced Heart Failure Team History and Physical Note   Primary Physician: Primary Cardiologist:  Dr Shirlee LatchMcLean  Primary Heart Failure: Dr Shirlee LatchMcLean EP: Dr Johney FrameAllred   Reason for Admission: Bacteremia likely associated with PICC.   HPI:   Mr Sheffield SliderHale is a 59 yo with history of CAD s/p multivessel PCI, ischemic cardiomyopathy with systolic CHF, CKD, St Jude ICD, and atrial flutter, and  S/P A flutter ablation 12/2014. A flutter ablation was complicated respiratory distress and required short term intubation. Unfortunately he had low output and was discharged on home milrinone at 0.375 mcg.   Today he is admitted for bacteremia noted from blood cultures. [facility]. Over the last few weeks WBC has been trending up 10>13>14. Most recent WBC up 14. No fevers or chills reported. Taking all medications. Denies SOB/PND/Orthopnea. + Bendopnea.  Weight at home has been 260. Prior to admit glucose has been uncontrolled. [facility] 1 unit nightly until glucose under 200.    ECHO 01/02/2015 EF 15%  Review of Systems: [y] = yes, [ ]  = no   General: Weight gain [ ] ; Weight loss [ ] ; Anorexia [ ] ; Fatigue [ ] ; Fever [ ] ; Chills [ ] ; Weakness [ ]   Cardiac: Chest pain/pressure [ ] ; Resting SOB [ ] ; Exertional SOB [Y ]; Orthopnea [ ] ; Pedal Edema [ ] ; Palpitations [ ] ; Syncope [ ] ; Presyncope [ ] ; Paroxysmal nocturnal dyspnea[ ]   Pulmonary: Cough [ ] ; Wheezing[ ] ; Hemoptysis[ ] ; Sputum [ ] ; Snoring [ ]   GI: Vomiting[ ] ; Dysphagia[ ] ; Melena[ ] ; Hematochezia [ ] ; Heartburn[ ] ; Abdominal pain [ ] ; Constipation [ ] ; Diarrhea [ ] ; BRBPR [ ]   GU: Hematuria[ ] ; Dysuria [ ] ; Nocturia[ ]   Vascular: Pain in legs with walking [ ] ; Pain in feet with lying flat [ ] ; Non-healing sores [ ] ; Stroke [ ] ; TIA [ ] ; Slurred speech [ ] ;  Neuro: Headaches[ ] ; Vertigo[ ] ; Seizures[ ] ; Paresthesias[ ] ;Blurred vision [ ] ; Diplopia [ ] ; Vision changes [ ]   Ortho/Skin: Arthritis [ ] ;  Joint pain [ ] ; Muscle pain [ ] ; Joint swelling [ ] ; Back Pain [ ] ; Rash [ ]   Psych: Depression[ ] ; Anxiety[ ]   Heme: Bleeding problems [ ] ; Clotting disorders [ ] ; Anemia [ ]   Endocrine: Diabetes [Y ]; Thyroid dysfunction[ ]   Home Medications Prior to Admission medications   Medication Sig Start Date End Date Taking? Authorizing Provider  apixaban (ELIQUIS) 5 MG TABS tablet Take 5 mg by mouth 2 (two) times daily.    Historical Provider, MD  atorvastatin (LIPITOR) 80 MG tablet Take 40 mg by mouth daily.     Historical Provider, MD  colchicine 0.6 MG tablet Take 1 tablet (0.6 mg total) by mouth daily. 01/12/15   Rhonda G Barrett, PA-C  colchicine 0.6 MG tablet Take 1 tablet (0.6 mg total) by mouth once. You may take 2 tabs the first time for gout flare, then 1 tab one hour later. Take 1 tab daily until symptoms resolve 03/12/15   Junius FinnerErin O'Malley, PA-C  digoxin (LANOXIN) 0.125 MG tablet Take 1 tablet (0.125 mg total) by mouth daily. 01/12/15   Rhonda G Barrett, PA-C  docusate sodium (COLACE) 100 MG capsule Take 200 mg by mouth daily.    Historical Provider, MD  hydrALAZINE (APRESOLINE) 25 MG tablet Take 1 tablet (25 mg total) by mouth every 8 (eight) hours. 03/01/15   Dolores Pattyaniel R Bensimhon, MD  insulin  glargine (LANTUS) 100 UNIT/ML injection Inject 0.4 mLs (40 Units total) into the skin at bedtime. 01/12/15   Rhonda G Barrett, PA-C  isosorbide mononitrate (IMDUR) 30 MG 24 hr tablet Take 1 tablet (30 mg total) by mouth daily. 01/12/15   Rhonda G Barrett, PA-C  Magnesium Oxide 200 MG TABS Take 1 tablet (200 mg total) by mouth 2 (two) times daily. 04/17/15   Amy D Filbert Schilder, NP  milrinone (PRIMACOR) 20 MG/100ML SOLN infusion Inject 50.0625 mcg/min into the vein continuous. 01/12/15   Rhonda G Barrett, PA-C  oxyCODONE-acetaminophen (ROXICET) 5-325 MG per tablet Take 1 tablet by mouth every 6 (six) hours as needed for pain. 04/14/13   Tiffany L Reed, DO  pantoprazole (PROTONIX) 40 MG tablet Take 1 tablet (40 mg total) by mouth  daily. 01/12/15   Rhonda G Barrett, PA-C  potassium chloride SA (K-DUR,KLOR-CON) 20 MEQ tablet Take 2 tablets (40 mEq total) by mouth 2 (two) times daily. 01/12/15   Rhonda G Barrett, PA-C  spironolactone (ALDACTONE) 25 MG tablet Take 1 tablet (25 mg total) by mouth daily. 01/12/15   Rhonda G Barrett, PA-C  torsemide (DEMADEX) 20 MG tablet Take 2 tablets (40 mg total) by mouth 2 (two) times daily. 01/12/15   Darrol Jump, PA-C    Past Medical History: Past Medical History  Diagnosis Date  . Atrial flutter with rapid ventricular response   . Vascular dementia   . CHF (congestive heart failure)   . Delirium   . Ischemic cardiomyopathy   . Septic shock 12/2014  . CAD (coronary artery disease)   . Anemia   . Hypertension   . Hyperlipidemia   . Gout of hand   . [name] (automatic cardioverter/defibrillator) present   . Myocardial infarction 1979?  Marland Kitchen Pneumonia     "maybe 3 times" (04/18/2015)  . IDDM (insulin dependent diabetes mellitus)   . History of blood transfusion 1982    "related to crushed leg/knee"  . Arthritis     "hands, knees, ankles" (04/18/2015)  . Anxiety   . Panic attacks   . Acute renal failure     Past Surgical History: Past Surgical History  Procedure Laterality Date  . Cardioversion    . Cardiac defibrillator placement  ~ 2013  . Atrial flutter ablation N/A 01/05/2015    Procedure: ATRIAL FLUTTER ABLATION;  Surgeon: Hillis Range, MD;  Location: [facility] CATH LAB;  Service: Cardiovascular;  Laterality: N/A;  . Tee without cardioversion N/A 01/05/2015    Procedure: TRANSESOPHAGEAL ECHOCARDIOGRAM (TEE);  Surgeon: Laurey Morale, MD;  Location: [facility] ENDOSCOPY;  Service: Cardiovascular;  Laterality: N/A;  . Appendectomy    . Tibia fracture surgery Left 1982    "crushed my thigh/leg/knee"  . Patella reconstruction Left 1982  . Ankle fracture surgery Bilateral 1980's  . Joint replacement    . Fracture surgery    . Coronary angioplasty with stent placement  ~ 2013    Family  History: Family History  Problem Relation Age of Onset  . Family history unknown: Yes    Social History: History   Social History  . Marital Status: Divorced    Spouse Name: N/A  . Number of Children: N/A  . Years of Education: N/A   Social History Main Topics  . Smoking status: Former Smoker    Types: Cigarettes  . Smokeless tobacco: Not on file     Comment: 04/18/2015 "stopped smoking in ~ 2000"  . Alcohol Use: No  . Drug Use: Not on file  .  Sexual Activity: Not Currently   Other Topics Concern  . Not on file   Social History Narrative    Allergies:  Allergies  Allergen Reactions  . Sulfa Antibiotics     unknown  . Sulfamethoxazole     unknown  . Zocor [Simvastatin]     unknown  . Lisinopril Rash    Other reaction(s): Angioedema (ALLERGY/intolerance)   Scheduled Meds: . apixaban  5 mg Oral BID  . atorvastatin  40 mg Oral Daily  . colchicine  0.6 mg Oral Daily  . digoxin  0.125 mg Oral Daily  . docusate sodium  200 mg Oral Daily  . hydrALAZINE  25 mg Oral 3 times per day  . insulin aspart  0-20 Units Subcutaneous TID WC  . insulin aspart  25 Units Subcutaneous Once  . insulin glargine  40 Units Subcutaneous QHS  . isosorbide mononitrate  30 mg Oral Daily  . magnesium oxide  200 mg Oral BID  . pantoprazole  40 mg Oral Daily  . potassium chloride SA  40 mEq Oral BID  . sodium chloride  3 mL Intravenous Q12H  . spironolactone  25 mg Oral Daily  . torsemide  40 mg Oral BID  . vancomycin  2,000 mg Intravenous Once   Continuous Infusions: . milrinone     PRN Meds:.sodium chloride, acetaminophen, ondansetron (ZOFRAN) IV, oxyCODONE-acetaminophen, sodium chloride   Objective:    Vital Signs:   Temp:  [97.8 F (36.6 C)] 97.8 F (36.6 C) (07/06 1443) Pulse Rate:  [120] 120 (07/06 1443) Resp:  [20] 20 (07/06 1443) BP: (131)/(87) 131/87 mmHg (07/06 1443) SpO2:  [99 %] 99 % (07/06 1443) Weight:  [266 lb 14.4 oz (121.065 kg)] 266 lb 14.4 oz (121.065 kg)  (07/06 1443)   Filed Weights   04/18/15 1443  Weight: 266 lb 14.4 oz (121.065 kg)    Physical Exam: General:  Well appearing. No resp difficulty. Sitting on the side of the bed.  HEENT: normal Neck: supple. JVP 5-6 . Carotids 2+ bilat; no bruits. No lymphadenopathy or thryomegaly appreciated. Cor: PMI nondisplaced. Tachy Regular rate & rhythm. No rubs, or murmurs. + S3  Lungs: clear Abdomen: soft, nontender, nondistended. No hepatosplenomegaly. No bruits or masses. Good bowel sounds. Extremities: no cyanosis, clubbing, rash, edema. RUE PICC  Neuro: alert & orientedx3, cranial nerves grossly intact. moves all 4 extremities w/o difficulty. Affect pleasant  Telemetry:  Sinus Tach 110s   Labs: Basic Metabolic Panel: No results for input(s): NA, K, CL, CO2, GLUCOSE, BUN, CREATININE, CALCIUM, MG, PHOS in the last 168 hours.  Liver Function Tests: No results for input(s): AST, ALT, ALKPHOS, BILITOT, PROT, ALBUMIN in the last 168 hours. No results for input(s): LIPASE, AMYLASE in the last 168 hours. No results for input(s): AMMONIA in the last 168 hours.  CBC: No results for input(s): WBC, NEUTROABS, HGB, HCT, MCV, PLT in the last 168 hours.  Cardiac Enzymes: No results for input(s): CKTOTAL, CKMB, CKMBINDEX, TROPONINI in the last 168 hours.  BNP: BNP (last 3 results)  Recent Labs  12/31/14 1430  BNP 386.4*    ProBNP (last 3 results) No results for input(s): PROBNP in the last 8760 hours.   CBG:  Recent Labs Lab 04/18/15 1543 04/18/15 1545  GLUCAP >600* >600*    Coagulation Studies: No results for input(s): LABPROT, INR in the last 72 hours.  Other results: EKG:  Imaging: No results found.      Assessment/Plan Discussion.   Mr Enslin is a  59 year old with chronic heart failure on home milrinone at 0.375 mcg, admitted today due to gram + cocci noted blood cultures 04/17/15.   1. Bacteremia- Blood Cultures obtained 7/5 with gram + cocci noted. Plan to remove  PICC and place PIV for home milrinone and antibiotics. Has St Jude ICD. May need TEE depending on speciation. ID consult requested. Start vancomycin per pharmacy.  Will eventually need another PICC for home milrinone. Has been followed by [facility]  2. Chronic Systolic HF ICM +/- component of tachy mediated CMP from A flutter. Most recent ECHO 12/2014 with EF 15%.  On home milrinone 0.375 mcg. Check CO-OX today prior to PICC removal.  Continue dig 0.125 mg daily, check level.  Volume status stable today. Continue torsemide 40 mg bid with 40 meq potassium  No bb with output. He is not on an Ace due to angioedema.  Check BMET, dig level, TSH today.  3.  A flutter: S/P AFL ablation 01/05/2015.  On eliquis 5 mg twice a day. Check CBC today.  4. CKD:  5. CAD: H/O PCI. No chest pain. On eliquis so no aspirin. Continue statin . Not on bb with low output. 6. Gout- on colchicine.  7. DMII- Uncontrolled Diabetes. Check Hgb A1C. On lantus. Add sliding scale. Check glucose AC/HS. Glucose on admit >600. Give 25 units novolog now. Consult [facility] hospitalist.  8. ST Jude ICD.   Length of Stay: 0   CLEGG,AMY  NP-C   04/18/2015, 4:43 PM  Advanced Heart Failure Team Pager [phone] (M-F; 7a - 4p)  Please contact [facility] for night-coverage after hours (4p -7a ) and weekends on [email or web address]  Patient seen with NP, agree with the above note.   1. Bacteremia: GPCs in blood, WBCs elevated.  No fever.  Likely PICC-related.  He has a [employer] school [employer] ICD which will be a concern. If S aureus, he will need TEE.   - Remove PICC - Vancomycin per pharmacy - When repeat blood cultures negative, will need PICC replaced for milrinone.  - ID to see.  2. Chronic systolic CHF: EF 45% in 3/16.  Ischemic CMP +/- component of tachy-mediated CMP given prior atrial flutter with RVR.  He has been on home milrinone at 0.375.  NYHA class II symptoms with stable volume.  Will check co-ox today, ?titrate down on milrinone.  Continue other home meds.   Check digoxin level.  He is going to be staying in [place] (not moving to [place]).  Will need to start LVAD evaluation (social situation may be a barrier).  3. Atrial flutter: s/p ablation.  Sinus tachy on today's ECG.  Remains on Eliquis.  4. Hyperglycemia: Uncontrolled DM. Will enlist [facility] help.  5. CAD: Stable.  On Eliquis so not on aspirin.   Marca Ancona 04/18/2015 5:07 PM

## 2015-04-19 ENCOUNTER — Inpatient Hospital Stay (HOSPITAL_COMMUNITY): Payer: Medicare Other

## 2015-04-19 DIAGNOSIS — T827XXD Infection and inflammatory reaction due to other cardiac and vascular devices, implants and grafts, subsequent encounter: Secondary | ICD-10-CM

## 2015-04-19 DIAGNOSIS — R509 Fever, unspecified: Secondary | ICD-10-CM

## 2015-04-19 DIAGNOSIS — E1310 Other specified diabetes mellitus with ketoacidosis without coma: Secondary | ICD-10-CM

## 2015-04-19 DIAGNOSIS — E1100 Type 2 diabetes mellitus with hyperosmolarity without nonketotic hyperglycemic-hyperosmolar coma (NKHHC): Secondary | ICD-10-CM | POA: Insufficient documentation

## 2015-04-19 LAB — BASIC METABOLIC PANEL
ANION GAP: 10 (ref 5–15)
ANION GAP: 11 (ref 5–15)
Anion gap: 11 (ref 5–15)
Anion gap: 11 (ref 5–15)
BUN: 23 mg/dL — AB (ref 6–20)
BUN: 24 mg/dL — ABNORMAL HIGH (ref 6–20)
BUN: 25 mg/dL — ABNORMAL HIGH (ref 6–20)
BUN: 25 mg/dL — ABNORMAL HIGH (ref 6–20)
CALCIUM: 9 mg/dL (ref 8.9–10.3)
CHLORIDE: 102 mmol/L (ref 101–111)
CHLORIDE: 100 mmol/L — AB (ref 101–111)
CO2: 24 mmol/L (ref 22–32)
CO2: 25 mmol/L (ref 22–32)
CO2: 26 mmol/L (ref 22–32)
CO2: 28 mmol/L (ref 22–32)
CREATININE: 1.34 mg/dL — AB (ref 0.61–1.24)
Calcium: 9.2 mg/dL (ref 8.9–10.3)
Calcium: 9.4 mg/dL (ref 8.9–10.3)
Calcium: 9.4 mg/dL (ref 8.9–10.3)
Chloride: 100 mmol/L — ABNORMAL LOW (ref 101–111)
Chloride: 97 mmol/L — ABNORMAL LOW (ref 101–111)
Creatinine, Ser: 1.36 mg/dL — ABNORMAL HIGH (ref 0.61–1.24)
Creatinine, Ser: 1.24 mg/dL (ref 0.61–1.24)
Creatinine, Ser: 1.33 mg/dL — ABNORMAL HIGH (ref 0.61–1.24)
GFR calc Af Amer: >60 mL/min (ref >60)
GFR calc Af Amer: >60 mL/min (ref >60)
GFR calc non Af Amer: 55 mL/min — ABNORMAL LOW (ref >60)
GFR calc non Af Amer: >60 mL/min (ref >60)
GFR calc non Af Amer: 57 mL/min — ABNORMAL LOW (ref >60)
GFR, EST NON AFRICAN AMERICAN: 56 mL/min — AB (ref >60)
GLUCOSE: 181 mg/dL — AB (ref 65–99)
GLUCOSE: 186 mg/dL — AB (ref 65–99)
GLUCOSE: 128 mg/dL — AB (ref 65–99)
Glucose, Bld: 256 mg/dL — ABNORMAL HIGH (ref 65–99)
POTASSIUM: 3.6 mmol/L (ref 3.5–5.1)
POTASSIUM: 3.9 mmol/L (ref 3.5–5.1)
Potassium: 3.5 mmol/L (ref 3.5–5.1)
Potassium: 3.7 mmol/L (ref 3.5–5.1)
SODIUM: 135 mmol/L (ref 135–145)
Sodium: 136 mmol/L (ref 135–145)
Sodium: 136 mmol/L (ref 135–145)
Sodium: 138 mmol/L (ref 135–145)

## 2015-04-19 LAB — GLUCOSE, CAPILLARY
GLUCOSE-CAPILLARY: 123 mg/dL — AB (ref 65–99)
GLUCOSE-CAPILLARY: 143 mg/dL — AB (ref 65–99)
GLUCOSE-CAPILLARY: 174 mg/dL — AB (ref 65–99)
GLUCOSE-CAPILLARY: 176 mg/dL — AB (ref 65–99)
GLUCOSE-CAPILLARY: 192 mg/dL — AB (ref 65–99)
GLUCOSE-CAPILLARY: 193 mg/dL — AB (ref 65–99)
GLUCOSE-CAPILLARY: 210 mg/dL — AB (ref 65–99)
GLUCOSE-CAPILLARY: 233 mg/dL — AB (ref 65–99)
GLUCOSE-CAPILLARY: 249 mg/dL — AB (ref 65–99)
GLUCOSE-CAPILLARY: 251 mg/dL — AB (ref 65–99)
Glucose-Capillary: 230 mg/dL — ABNORMAL HIGH (ref 65–99)
Glucose-Capillary: 224 mg/dL — ABNORMAL HIGH (ref 65–99)
Glucose-Capillary: 146 mg/dL — ABNORMAL HIGH (ref 65–99)
Glucose-Capillary: 255 mg/dL — ABNORMAL HIGH (ref 65–99)
Glucose-Capillary: 298 mg/dL — ABNORMAL HIGH (ref 65–99)
Glucose-Capillary: 132 mg/dL — ABNORMAL HIGH (ref 65–99)

## 2015-04-19 LAB — HEMOGLOBIN A1C
Hgb A1c MFr Bld: 12.2 % — ABNORMAL HIGH (ref 4.8–5.6)
MEAN PLASMA GLUCOSE: 303 mg/dL

## 2015-04-19 MED ORDER — INSULIN ASPART 100 UNIT/ML ~~LOC~~ SOLN
0.0000 [IU] | Freq: Every day | SUBCUTANEOUS | Status: DC
  Administered 2015-04-20: 3 [IU] via SUBCUTANEOUS
  Administered 2015-04-23: 2 [IU] via SUBCUTANEOUS

## 2015-04-19 MED ORDER — PERFLUTREN LIPID MICROSPHERE
1.0000 mL | INTRAVENOUS | Status: AC | PRN
  Administered 2015-04-19: 5 mL via INTRAVENOUS
  Filled 2015-04-19: qty 10

## 2015-04-19 MED ORDER — INSULIN GLARGINE 100 UNIT/ML ~~LOC~~ SOLN
45.0000 [IU] | Freq: Every day | SUBCUTANEOUS | Status: DC
  Administered 2015-04-19: 45 [IU] via SUBCUTANEOUS
  Filled 2015-04-19 (×2): qty 0.45

## 2015-04-19 MED ORDER — INSULIN ASPART 100 UNIT/ML ~~LOC~~ SOLN
8.0000 [IU] | Freq: Three times a day (TID) | SUBCUTANEOUS | Status: DC
  Administered 2015-04-19 – 2015-04-20 (×3): 8 [IU] via SUBCUTANEOUS

## 2015-04-19 MED ORDER — INSULIN ASPART 100 UNIT/ML ~~LOC~~ SOLN
0.0000 [IU] | Freq: Three times a day (TID) | SUBCUTANEOUS | Status: DC
  Administered 2015-04-19: 11 [IU] via SUBCUTANEOUS
  Administered 2015-04-20: 7 [IU] via SUBCUTANEOUS
  Administered 2015-04-20: 4 [IU] via SUBCUTANEOUS
  Administered 2015-04-20: 15 [IU] via SUBCUTANEOUS
  Administered 2015-04-21 (×2): 11 [IU] via SUBCUTANEOUS
  Administered 2015-04-21: 7 [IU] via SUBCUTANEOUS
  Administered 2015-04-22: 11 [IU] via SUBCUTANEOUS
  Administered 2015-04-22 (×2): 7 [IU] via SUBCUTANEOUS
  Administered 2015-04-23: 20 [IU] via SUBCUTANEOUS
  Administered 2015-04-23: 7 [IU] via SUBCUTANEOUS
  Administered 2015-04-24: 3 [IU] via SUBCUTANEOUS
  Administered 2015-04-24: 4 [IU] via SUBCUTANEOUS

## 2015-04-19 MED ORDER — INSULIN ASPART 100 UNIT/ML ~~LOC~~ SOLN
0.0000 [IU] | Freq: Three times a day (TID) | SUBCUTANEOUS | Status: DC
  Administered 2015-04-19: 8 [IU] via SUBCUTANEOUS

## 2015-04-19 NOTE — Progress Notes (Signed)
[facility] PROGRESS NOTE  Daniel Blankenship WUJ:[id number] DOB: 01/06/1956 DOA: 04/18/2015 PCP: PROVIDER NOT IN SYSTEM  Assessment/Plan: 1. 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 BMP revealed anion gap less than 12 with a bicarbonate of 25 -Underlying Infectious process likely increasing insulin requirement.  -Initially placed on IV insulin with blood sugars trending down. This morning he was transitioned to his home dose of Lantus at 45 units subcutaneous daily with discontinuation of IV insulin -Provide mealtime coverage with 8 units of NovoLog  -Continue to monitor blood sugars.  2. Probable Infected line -Patient having a PICC line previously placed for administration of IV milrinone, no having positive blood cultures -Infectious disease was consulted, PICC line was removed as patient was started on IV vancomycin. -Plan to continue IV vancomycin until cultures are finalized. His blood cultures will be repeated in 24-48 hours  3.  Ischemic cardiomyopathy -Patient with history of ischemic cardiomyopathy, with transthoracic echocardiogram performed on 01/05/2015 showing ejection fraction of 15% with diffuse hypokinesis. Status post [name] placement. -Repeat transthoracic echocardiogram performed on 04/19/2015 showing poor image quality, however no obvious vegetation seen. -Patient may need transesophageal echocardiogram, cardiology managing  Code Status: Full code Family Communication:  Disposition Plan:    HPI/Subjective: Patient is a 59 year old with a past medical history of ischemic cardiac myopathy have an ejection fraction 15%, status post ICD placement, on chronic IV milrinone therapy, status post PICC line placement, having blood cultures drawn that were drawn on 04/17/2015 that grew gram-positive cocci in clusters. Patient was started on IV  vancomycin with removal of PICC line. Medicine consulted for management of hyperglycemia and DM. He was started on IV insulin with blood sugars trending down.   Objective: Filed Vitals:   04/19/15 1528  BP: 128/77  Pulse: 110  Temp: 97.9 F (36.6 C)  Resp:     Intake/Output Summary (Last 24 hours) at 04/19/15 1541 Last data filed at 04/19/15 0647  Gross per 24 hour  Intake 159.63 ml  Output   1475 ml  Net -1315.37 ml   Filed Weights   04/18/15 1443 04/19/15 0553  Weight: 121.065 kg (266 lb 14.4 oz) 120.4 kg (265 lb 6.9 oz)    Exam:   General:  Patient is awake alert, and related down the hallway, states doing well, he is nontoxic appearing  Cardiovascular:  regular rate rhythm, normal S1-S2, no edema   Respiratory: Normal respiratory effort, minimal bibasilar crackles  Abdomen: Soft nontender nondistended  Musculoskeletal: No edema  Data Reviewed: Basic Metabolic Panel:  Recent Labs Lab 04/18/15 1600 04/18/15 2257 04/19/15 04/19/15 0345 04/19/15 0741 04/19/15 1225  NA 129* 134* 136 136 138 135  K 4.3 3.7 3.5 3.6 3.7 3.9  CL 91* 95* 97* 100* 102 100*  CO2 GLUCOSE 597* 214* 186* 181* 128* 256*  BUN 23* 25* 25* 25* 23* 24*  CREATININE 1.53* 1.35* 1.34* 1.36* 1.24 1.33*  CALCIUM 9.2 9.1 9.0 9.2 9.4 9.4  MG 2.1  --   --   --   --   --    Liver Function Tests:  Recent Labs Lab 04/18/15 1600  AST 19  ALT 16*  ALKPHOS 130*  BILITOT 0.7  PROT 7.9  ALBUMIN 3.2*   No results for input(s): LIPASE, AMYLASE in the last 168 hours. No resJUNE VACHAs): AMMONIA in the last 168 hours. CBC:  Recent Labs Lab 04/18/15 1600  WBC 14.5*  NEUTROABS 11.8*  HGB 13.4  HCT 39.6  MCV 77.0*  PLT 289   Cardiac Enzymes: No results for input(s): CKTOTAL, CKMB, CKMBINDEX, TROPONINI in the last 168 hours. BNP (last 3 results)  Recent Labs  12/31/14 1430  BNP 386.4*    ProBNP (last 3 results) No results for input(s): PROBNP in the last  8760 hours.  CBG:  Recent Labs Lab 04/19/15 0636 04/19/15 0757 04/19/15 0908 04/19/15 1039 04/19/15 1153  GLUCAP 146* 143* 123* 249* 255*    Recent Results (from the past 240 hour(s))  Culture, blood (routine x 2)     Status: None (Preliminary result)   Collection Time: 04/17/15 11:25 AM  Result Value Ref Range Status   Specimen Description BLOOD RIGHT ARM  Final   Special Requests BOTTLES DRAWN AEROBIC AND ANAEROBIC 10CC  Final   Culture NO GROWTH 2 DAYS  Final   Report Status PENDING  Incomplete  Culture, blood (routine x 2)     Status: None (Preliminary result)   Collection Time: 04/17/15 11:25 AM  Result Value Ref Range Status   Specimen Description BLOOD LEFT ARM  Final   Special Requests BOTTLES DRAWN AEROBIC AND ANAEROBIC 10CC  Final   Culture  Setup Time   Final    GRAM POSITIVE COCCI IN CLUSTERS AEROBIC BOTTLE ONLY CRITICAL RESULT CALLED TO, READ BACK BY AND VERIFIED WITH: M BRADLEY,RN AT 1149 04/18/15 BY L BENFIELD GRAM POSITIVE RODS ANAEROBIC BOTTLE ONLY    Culture GRAM POSITIVE COCCI  Final   Report Status PENDING  Incomplete     Studies: No results found.  Scheduled Meds: . apixaban  5 mg Oral BID  . atorvastatin  40 mg Oral Daily  . digoxin  0.125 mg Oral Daily  . docusate sodium  200 mg Oral Daily  . hydrALAZINE  25 mg Oral 3 times per day  . insulin aspart  0-20 Units Subcutaneous TID WC  . insulin aspart  0-5 Units Subcutaneous QHS  . insulin aspart  8 Units Subcutaneous TID WC  . insulin glargine  45 Units Subcutaneous QHS  . isosorbide mononitrate  30 mg Oral Daily  . magnesium oxide  200 mg Oral BID  . pantoprazole  40 mg Oral Daily  . potassium chloride SA  40 mEq Oral BID  . sodium chloride  3 mL Intravenous Q12H  . spironolactone  25 mg Oral Daily  . torsemide  40 mg Oral BID  . vancomycin  1,500 mg Intravenous Q24H   Continuous Infusions: . sodium chloride 10 mL/hr at 04/18/15 1847  . milrinone 0.375 mcg/kg/min (04/19/15 0326)     Principal Problem:   Bacteremia associated with intravascular line Active Problems:   CAD (coronary artery disease)   DM type 2, uncontrolled, with renal complications   Acute on chronic systolic and diastolic heart failure, NYHA class 4   CKD (chronic kidney disease), stage III   Diabetic ketoacidosis without coma associated with type 2 diabetes mellitus    Time spent: 25 min    Jeralyn BennettZAMORA, Abdishakur Gottschall  [facility] Pager [phone]. If 7PM-7AM, please contact night-coverage at [email or web address], password [email or web address] 04/19/2015, 3:41 PM  LOS: 1 day

## 2015-04-19 NOTE — Progress Notes (Signed)
[facility] EDUCATION NOTE:   VAD teaching initiated with patient; no family present.     VAD educational packet including "HM II Patient Handbook", "HM II Left Ventricular Assist System" packet, and "[place] HM II Patient Education" left at bedside for continued reference. Explained need for 24/7 care when pt is  discharged home due to sternal precautions, adaptation to living on support, emotional support, consistent and meticulous exit site care and management, medication adherence and high volume of follow up visits with the V[facility] Clinic after discharge.   Explained that LVAD can be implanted for two indications in the setting of advanced left ventricular heart failure treatment:  1. Bridge to transplant - used for patients who cannot safely wait for heart transplant without this device.  Or   2. Destination therapy - used for patients until end of life or recovery of heart function.  Extended the option to have one of our current patients and caregiver come to talk with him about living on support to assist with decision making.   Pt wants to think about VAD option before consenting to evaluation process. VAD consent along with teaching packet left at bedside.    All questions have been answered at this time and contact information was provided should he have any further questions.    Total Session Time:  30 minutes  Hessie DienerMolly Hargun Spurling, RN VAD Coordinator  Office: [phone] 24/7 VAD Pager: [phone]

## 2015-04-19 NOTE — Progress Notes (Signed)
Patient ID: Daniel Blankenship, male   DOB: 05/10/1956, 59 y.o.   MRN: [id number]         [facility]    Date of Admission:  04/18/2015           Day 1 vancomycin  Principal Problem:   Bacteremia associated with intravascular line Active Problems:   CAD (coronary artery disease)   DM type 2, uncontrolled, with renal complications   Acute on chronic systolic and diastolic heart failure, NYHA class 4   CKD (chronic kidney disease), stage III   Diabetic ketoacidosis without coma associated with type 2 diabetes mellitus   . apixaban  5 mg Oral BID  . atorvastatin  40 mg Oral Daily  . digoxin  0.125 mg Oral Daily  . docusate sodium  200 mg Oral Daily  . hydrALAZINE  25 mg Oral 3 times per day  . insulin aspart  0-15 Units Subcutaneous TID WC  . insulin aspart  0-5 Units Subcutaneous QHS  . insulin glargine  45 Units Subcutaneous QHS  . isosorbide mononitrate  30 mg Oral Daily  . magnesium oxide  200 mg Oral BID  . pantoprazole  40 mg Oral Daily  . potassium chloride SA  40 mEq Oral BID  . sodium chloride  3 mL Intravenous Q12H  . spironolactone  25 mg Oral Daily  . torsemide  40 mg Oral BID  . vancomycin  1,500 mg Intravenous Q24H    Subjective: He did not get much sleep because he was being stuck every hour for blood sugars. He has not had any chills or sweats.  Review of Systems: Pertinent items are noted in HPI.  Past Medical History  Diagnosis Date  . Atrial flutter with rapid ventricular response   . Vascular dementia   . CHF (congestive heart failure)   . Delirium   . Ischemic cardiomyopathy   . Septic shock 12/2014  . CAD (coronary artery disease)   . Anemia   . Hypertension   . Hyperlipidemia   . Gout of hand   . [name] (automatic cardioverter/defibrillator) present   . Myocardial infarction 1979?  [place]. Pneumonia     "maybe 3 times" (04/18/2015)  . IDDM (insulin dependent diabetes mellitus)   . History of blood transfusion 1982   "related to crushed leg/knee"  . Arthritis     "hands, knees, ankles" (04/18/2015)  . Anxiety   . Panic attacks   . Acute renal failure   . 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"    History  Substance Use Topics  . Smoking status: Former Smoker -- 1.50 packs/day for 14 years    Types: Cigarettes  . Smokeless tobacco: Never Used     Comment: 04/18/2015 "stopped smoking in ~ 2000"  . Alcohol Use: Yes     Comment: 04/18/2015 "stopped drinking in the 1980's    Family History  Problem Relation Age of Onset  . Family history unknown: Yes   Allergies  Allergen Reactions  . Sulfa Antibiotics Anaphylaxis and Hives  . Sulfamethoxazole Anaphylaxis and Hives    unknown  . Zocor [Simvastatin] Other (See Comments)    Swelling, trouble breathing.  . Lisinopril Rash    Other reaction(s): Angioedema (ALLERGY/intolerance)    OBJECTIVE: Blood pressure 113/71, pulse 109, temperature 98.2 F (36.8 C), temperature source Oral, resp. rate 18, height 6\' 3"  (1.905 m), weight 265 lb 6.9 oz (120.4 kg), SpO2 91 %. General: He is  alert, talkative and spirits Skin: No rash Lungs: Clear Cor: Regular S1 and S2 with no murmurs Chest: [name] site appears normal  Lab Results Lab Results  Component Value Date   WBC 14.5* 04/18/2015   HGB 13.4 04/18/2015   HCT 39.6 04/18/2015   MCV 77.0* 04/18/2015   PLT 289 04/18/2015    Lab Results  Component Value Date   CREATININE 1.24 04/19/2015   BUN 23* 04/19/2015   NA 138 04/19/2015   K 3.7 04/19/2015   CL 102 04/19/2015   CO2 26 04/19/2015    Lab Results  Component Value Date   ALT 16* 04/18/2015   AST 19 04/18/2015   ALKPHOS 130* 04/18/2015   BILITOT 0.7 04/18/2015     Microbiology: Recent Results (from the past 240 hour(s))  Culture, blood (routine x 2)     Status: None (Preliminary result)   Collection Time: 04/17/15 11:25 AM  Result Value Ref Range Status   Specimen Description BLOOD RIGHT ARM  Final   Special Requests  BOTTLES DRAWN AEROBIC AND ANAEROBIC 10CC  Final   Culture NO GROWTH 1 DAY  Final   Report Status PENDING  Incomplete  Culture, blood (routine x 2)     Status: None (Preliminary result)   Collection Time: 04/17/15 11:25 AM  Result Value Ref Range Status   Specimen Description BLOOD LEFT ARM  Final   Special Requests BOTTLES DRAWN AEROBIC AND ANAEROBIC 10CC  Final   Culture  Setup Time   Final    GRAM POSITIVE COCCI IN CLUSTERS AEROBIC BOTTLE ONLY CRITICAL RESULT CALLED TO, READ BACK BY AND VERIFIED WITH: M BRADLEY,RN AT 1149 04/18/15 BY L BENFIELD GRAM POSITIVE RODS ANAEROBIC BOTTLE ONLY    Culture GRAM POSITIVE COCCI  Final   Report Status PENDING  Incomplete    Assessment: He has one positive blood culture showed gram-positive cocci in clusters and gram-positive rods on stain. Final culture results are pending. I will repeat blood cultures today and continue vancomycin.  Plan: 1. Continue vancomycin 2. Repeat blood cultures 3. Echocardiogram  Cliffton Asters, MD [facility] [phone] pager   [phone] cell 04/19/2015, 12:03 PM

## 2015-04-19 NOTE — Care Management Note (Signed)
Case Management Note  Patient Details  Name: Daniel Blankenship MRN: [id number] Date of Birth: 22-Oct-1955  Subjective/Objective:          Admitted with Bacteremia          Action/Plan: [facility];CM will continue to follow for DCP.  Expected Discharge Date:   04/23/2015               Expected Discharge Plan:  Home w Home Health Services  Discharge planning Services  CM Consult      [facility]  Status of Service:  In process, will continue to follow   Cherrie DistanceChandler, Effa Yarrow L, RN, MHA,BSN [phone] 04/19/2015, 11:47 AM

## 2015-04-19 NOTE — Progress Notes (Signed)
Lab came up to draw blood for blood cultures. Pt did not refused, but asked the lab to come back later because he was resting.  Explained to patient that by asking them to come back later, he was delaying treatment.  He did not care.

## 2015-04-19 NOTE — Progress Notes (Signed)
CARDIAC REHAB PHASE I   PRE:  Rate/Rhythm: 113 ST    BP: sitting 130/86    SaO2: 100 RA  MODE:  Ambulation: 500 ft   POST:  Rate/Rhythm: 135 ST    BP: sitting 135/93     SaO2: 100 RA  Pt willing to walk using RW, assist x2 for supervision. Steady, pt proud of himself for how he has been doing at home. Sts he has been doing pushups and situps at home and walks at the mall with his son when his son shops. Pt sts he is not SOB walking today and that he feels great although he seemed atleast somewhat SOB. HR 135 ST. To bed for echo. Will continue to follow. [id number]   [name], Daniel Westbay BancroftKristan CES, ACSM 04/19/2015 11:46 AM

## 2015-04-19 NOTE — Progress Notes (Signed)
Nutrition Brief Note  Patient identified on the Malnutrition Screening Tool (MST) Report  Wt Readings from Last 15 Encounters:  04/19/15 265 lb 6.9 oz (120.4 kg)  03/29/15 262 lb (118.842 kg)  03/01/15 282 lb 4 oz (128.028 kg)  02/05/15 281 lb 6.4 oz (127.642 kg)  02/01/15 281 lb 4 oz (127.574 kg)  01/12/15 274 lb 4 oz (124.4 kg)  05/02/13 270 lb (122.471 kg)  04/21/13 270 lb (122.471 kg)    Body mass index is 33.18 kg/(m^2). Patient meets criteria for Obesity based on current BMI. Pt states that he used to weigh 330 lbs and he has been slowly losing weight over the past 2 years intentionally. He reports following a low carb low sodium diet PTA. He eats mostly fruits, vegetables, salads, baked chicken and baked fish. He denies any nutrition education needs at this time.   Current diet order is Carb Modified, patient is consuming approximately 100% of meals at this time. Labs and medications reviewed.   No nutrition interventions warranted at this time. If nutrition issues arise, please consult RD.   Ian Malkineanne Barnett RD, LDN Inpatient Clinical Dietitian Pager: [phone] After Hours Pager: [phone]

## 2015-04-19 NOTE — Progress Notes (Signed)
  Echocardiogram 2D Echocardiogram has been performed.  Arvil ChacoFoster, Shandrika Ambers 04/19/2015, 12:52 PM

## 2015-04-19 NOTE — Progress Notes (Signed)
Advanced Heart Failure Rounding Note   Subjective:    Admitted with bacteremia likely associated with PICC. PICC removed with PIV started. 1 of 2 blood cultures Gram + cocci. Started on Vancomycin. ID consulted. Glucose uncontrolled- [facility] consulted.   Denies SOB.      Objective:   Weight Range:  Vital Signs:   Temp:  [97 F (36.1 C)-97.8 F (36.6 C)] 97.6 F (36.4 C) (07/07 0553) Pulse Rate:  [102-120] 111 (07/07 0553) Resp:  [18-20] 18 (07/07 0553) BP: (111-131)/(77-87) 111/83 mmHg (07/07 0553) SpO2:  [98 %-99 %] 98 % (07/07 0553) Weight:  [265 lb 6.9 oz (120.4 kg)-266 lb 14.4 oz (121.065 kg)] 265 lb 6.9 oz (120.4 kg) (07/07 0553) Last BM Date: 04/18/15  Weight change: Filed Weights   04/18/15 1443 04/19/15 0553  Weight: 266 lb 14.4 oz (121.065 kg) 265 lb 6.9 oz (120.4 kg)    Intake/Output:   Intake/Output Summary (Last 24 hours) at 04/19/15 0744 Last data filed at 04/19/15 0647  Gross per 24 hour  Intake 659.63 ml  Output   1475 ml  Net -815.37 ml     Physical Exam: General:  Well appearing. No resp difficulty. In bed.  HEENT: normal Neck: supple. JVP 5-6  . Carotids 2+ bilat; no bruits. No lymphadenopathy or thryomegaly appreciated. Cor: PMI nondisplaced. Regular rate & rhythm. No rubs, or murmurs. + S3  Lungs: clear Abdomen: soft, nontender, nondistended. No hepatosplenomegaly. No bruits or masses. Good bowel sounds. Extremities: no cyanosis, clubbing, rash, edema Neuro: alert & orientedx3, cranial nerves grossly intact. moves all 4 extremities w/o difficulty. Affect pleasant  Telemetry: Sinus Tach   Labs: Basic Metabolic Panel:  Recent Labs Lab 04/18/15 1600 04/18/15 2257 04/19/15 04/19/15 0345  NA 129* 134* 136 136  K 4.3 3.7 3.5 3.6  CL 91* 95* 97* 100*  CO2 GLUCOSE 597* 214* 186* 181*  BUN 23* 25* 25* 25*  CREATININE 1.53* 1.35* 1.34* 1.36*  CALCIUM 9.2 9.1 9.0 9.2  MG 2.1  --   --   --     Liver Function Tests:  Recent  Labs Lab 04/18/15 1600  AST 19  ALT 16*  ALKPHOS 130*  BILITOT 0.7  PROT 7.9  ALBUMIN 3.2*   No results for input(s): LIPASE, AMYLASE in the last 168 hours. No results for input(s): AMMONIA in the last 168 hours.  CBC:  Recent Labs Lab 04/18/15 1600  WBC 14.5*  NEUTROABS 11.8*  HGB 13.4  HCT 39.6  MCV 77.0*  PLT 289    Cardiac Enzymes: No results for input(s): CKTOTAL, CKMB, CKMBINDEX, TROPONINI in the last 168 hours.  BNP: BNP (last 3 results)  Recent Labs  12/31/14 1430  BNP 386.4*    ProBNP (last 3 results) No results for input(s): PROBNP in the last 8760 hours.    Other results:  Imaging:  No results found.   Medications:     Scheduled Medications: . apixaban  5 mg Oral BID  . atorvastatin  40 mg Oral Daily  . digoxin  0.125 mg Oral Daily  . docusate sodium  200 mg Oral Daily  . feeding supplement (ENSURE ENLIVE)  237 mL Oral BID BM  . hydrALAZINE  25 mg Oral 3 times per day  . insulin regular  0-10 Units Intravenous TID WC  . isosorbide mononitrate  30 mg Oral Daily  . magnesium oxide  200 mg Oral BID  . pantoprazole  40 mg Oral Daily  .  potassium chloride SA  40 mEq Oral BID  . sodium chloride  3 mL Intravenous Q12H  . spironolactone  25 mg Oral Daily  . torsemide  40 mg Oral BID  . vancomycin  1,500 mg Intravenous Q24H     Infusions: . sodium chloride 10 mL/hr at 04/18/15 1847  . dextrose 5 % and 0.45% NaCl 10 mL/hr at 04/18/15 2247  . insulin (NOVOLIN-R) infusion 4.3 Units/hr (04/19/15 [id number])  . milrinone 0.375 mcg/kg/min (04/19/15 0326)     PRN Medications:  sodium chloride, acetaminophen, dextrose, ondansetron (ZOFRAN) IV, oxyCODONE-acetaminophen, sodium chloride   Assessment/Plan    [name] is a 59 year old with chronic heart failure on home milrinone at 0.375 mcg, admitted today due to gram + cocci noted blood cultures 04/17/15.   1. Bacteremia- Blood Cultures obtained 7/5 with gram + cocci noted in 1 of 2 . Plan to  remove PICC and place PIV for home milrinone and antibiotics. Has St Jude ICD. May need TEE depending on speciation. ID appreciated.   Day 2 of vanc. Pharmacy dosing.  [facility]  [facility]  2. 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 Most recent ECHO 12/2014 with EF 15%. On home milrinone 0.375 mcg. Check CO-OX today prior to PICC removal.  Continue dig 0.125 mg daily--> Dig level 0.4   Volume status stable today. Continue torsemide 40 mg bid with 40 meq potassium  No bb with output. He is not on an Ace due to angioedema.  3. A flutter: S/P AFL ablation 01/05/2015. On eliquis 5 mg twice a day.   4. CKD: Creatinine 1.5>1.36  5. CAD: H/O PCI. No chest pain. On eliquis so no aspirin. Continue statin . Not on bb with low output. 6. Gout- on colchicine.  7. DMII- Uncontrolled Diabetes. Glucose > 600.  Hgb A1C 12.2  [facility] consulted. Started on insulin drip.  8. ST Jude ICD  Length of Stay: 1  Daniel Blankenship,Daniel Blankenship  04/19/2015, 7:44 AM  Advanced Heart Failure Team Pager [phone] (M-F; 7a - 4p)  Please contact [facility] for night-coverage after hours (4p -7a ) and weekends on [email or web address]  Patient seen with NP, agree with the above note.   PICC out, suspect PICC-related infection.  Awaiting speciation of GPCs in blood.  Will order TTE for now.  Concern if S aureus for ICD leads, may need TEE in that case.  Will need replacement of PICC eventually when blood cultures negative.  Will repeat blood cultures tomorrow.   Continue milrinone via PIV for now.  Looks stable, no significant volume overload.  Will start evaluation for LVAD while here.  Social situation and poor diabetes control may be limiting factors.    [facility] helping with diabetes management.  [facility]  HgbA1c 12.2.  Would like to get him in to see endocrinologist after discharge.   Daniel Blankenship 04/19/2015 8:44 AM

## 2015-04-19 NOTE — Progress Notes (Signed)
Inpatient Diabetes Program Recommendations  AACE/ADA: New Consensus Statement on Inpatient Glycemic Control (2013)  Target Ranges:  Prepandial:   less than 140 mg/dL      Peak postprandial:   less than 180 mg/dL (1-2 hours)      Critically ill patients:  140 - 180 mg/dL   Inpatient Diabetes Program Recommendations Correction (SSI): consider increasing to resistant scale  Will follow. Thank you  Piedad ClimesGina Jonaven Hilgers BSN, RN,CDE Inpatient Diabetes Coordinator [phone] (team pager)

## 2015-04-19 NOTE — Progress Notes (Signed)
Utilization review completed. Marcelo Ickes, RN, BSN. 

## 2015-04-20 DIAGNOSIS — N183 Chronic kidney disease, stage 3 (moderate): Secondary | ICD-10-CM

## 2015-04-20 LAB — GLUCOSE, CAPILLARY
GLUCOSE-CAPILLARY: 301 mg/dL — AB (ref 65–99)
Glucose-Capillary: 245 mg/dL — ABNORMAL HIGH (ref 65–99)
Glucose-Capillary: 199 mg/dL — ABNORMAL HIGH (ref 65–99)
Glucose-Capillary: 278 mg/dL — ABNORMAL HIGH (ref 65–99)

## 2015-04-20 LAB — HEMOGLOBIN A1C
HEMOGLOBIN A1C: 12.3 % — AB (ref 4.8–5.6)
MEAN PLASMA GLUCOSE: 306 mg/dL

## 2015-04-20 LAB — BASIC METABOLIC PANEL
Anion gap: 11 (ref 5–15)
BUN: 20 mg/dL (ref 6–20)
CALCIUM: 9.2 mg/dL (ref 8.9–10.3)
CO2: 25 mmol/L (ref 22–32)
Chloride: 99 mmol/L — ABNORMAL LOW (ref 101–111)
Creatinine, Ser: 1.34 mg/dL — ABNORMAL HIGH (ref 0.61–1.24)
GFR calc non Af Amer: 56 mL/min — ABNORMAL LOW (ref >60)
GLUCOSE: 241 mg/dL — AB (ref 65–99)
Potassium: 4 mmol/L (ref 3.5–5.1)
Sodium: 135 mmol/L (ref 135–145)

## 2015-04-20 LAB — CBC
HCT: 37.9 % — ABNORMAL LOW (ref 39.0–52.0)
Hemoglobin: 12.5 g/dL — ABNORMAL LOW (ref 13.0–17.0)
MCH: 25.5 pg — ABNORMAL LOW (ref 26.0–34.0)
MCHC: 33 g/dL (ref 30.0–36.0)
MCV: 77.2 fL — AB (ref 78.0–100.0)
Platelets: 277 10*3/uL (ref 150–400)
RBC: 4.91 MIL/uL (ref 4.22–5.81)
RDW: 14.2 % (ref 11.5–15.5)
WBC: 14.8 10*3/uL — ABNORMAL HIGH (ref 4.0–10.5)

## 2015-04-20 MED ORDER — HYDRALAZINE HCL 25 MG PO TABS
37.5000 mg | ORAL_TABLET | Freq: Three times a day (TID) | ORAL | Status: DC
  Filled 2015-04-20 (×2): qty 1.5

## 2015-04-20 MED ORDER — CARVEDILOL 3.125 MG PO TABS
3.1250 mg | ORAL_TABLET | Freq: Two times a day (BID) | ORAL | Status: DC
  Administered 2015-04-20 – 2015-04-24 (×9): 3.125 mg via ORAL
  Filled 2015-04-20 (×11): qty 1

## 2015-04-20 MED ORDER — INSULIN ASPART 100 UNIT/ML ~~LOC~~ SOLN
10.0000 [IU] | Freq: Three times a day (TID) | SUBCUTANEOUS | Status: DC
  Administered 2015-04-20: 10 [IU] via SUBCUTANEOUS

## 2015-04-20 MED ORDER — HYDRALAZINE HCL 25 MG PO TABS
25.0000 mg | ORAL_TABLET | Freq: Three times a day (TID) | ORAL | Status: DC
  Administered 2015-04-20 – 2015-04-24 (×11): 25 mg via ORAL
  Filled 2015-04-20 (×15): qty 1

## 2015-04-20 MED ORDER — INSULIN GLARGINE 100 UNIT/ML ~~LOC~~ SOLN
48.0000 [IU] | Freq: Every day | SUBCUTANEOUS | Status: DC
  Administered 2015-04-20: 48 [IU] via SUBCUTANEOUS
  Filled 2015-04-20: qty 0.48

## 2015-04-20 NOTE — Progress Notes (Signed)
CARDIAC REHAB PHASE I   PRE:  Rate/Rhythm: 110 ST  BP:  Sitting: 130/67        SaO2: 98 RA  MODE:  Ambulation: 500 ft   POST:  Rate/Rhythm: 135 ST, 120 after 2 minutes rest  BP:  Sitting: 123/97         SaO2: 97 RA  Pt lying in bed, c/o L shoulder pain, pt states he is hoping to go home in 1-2 days, once PICC line is placed. Pt agreeable to walk. Pt ambulated 500 ft on RA, standby assist, rolling walker, IV pole, steady gait, tolerated well. Pt HR elevated with ambulation, pt appears mildly short of breath, however denies DOE. Attempted to review CHF education, pt states he already has the CHF book, states he has a HHRN that visits him 3x/week and he reports his weight and blood sugar to the nurse daily. Pt states he exercises and "watches" what he eats and watches his fluid intake. Discussed phase 2 cardiac rehab, pt declines at this time. Pt states has not refused an LVAD workup and is still considering this as a possibility. Encouraged pt to discuss this with the doctor to clarify pt position.  [phone]  Joylene GrapesMonge, Lee-Anne Flicker C, RN, BSN 04/20/2015 10:02 AM

## 2015-04-20 NOTE — Progress Notes (Signed)
[facility] PROGRESS NOTE  Tommas OlpChristopher T Blubaugh ZOX:[id number] DOB: [birth year] DOA: 04/18/2015 PCP: PROVIDER NOT IN SYSTEM  Assessment/Plan: 1. 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 BMP revealed anion gap less than 12 with a bicarbonate of 25 -Underlying Infectious process likely increasing insulin requirement.  -Initially placed on IV insulin with blood sugars trending down. This morning he was transitioned to his home dose of Lantus at 45 units subcutaneous daily with discontinuation of IV insulin -Blood sugars fluctuating between 132 -301.  -I will increase his Lantus to 48 units [place] daily and meal time coverage to 10 units [place]   2. Probable Infected line -Patient having a PICC line previously placed for administration of IV milrinone, no having positive blood cultures -Infectious disease was consulted, PICC line was removed as patient was started on IV vancomycin. -Plan to continue IV vancomycin until cultures are finalized.  -Blood cultures repeated on 04/19/2015 showing no growth to date  3.  Ischemic cardiomyopathy -Patient with history of ischemic cardiomyopathy, with transthoracic echocardiogram performed on 01/05/2015 showing ejection fraction of 15% with diffuse hypokinesis. Status post [name] placement. -Repeat transthoracic echocardiogram performed on 04/19/2015 showing poor image quality, however no obvious vegetation seen. -cardiology managing  Code Status: Full code Family Communication:  Disposition Plan:    HPI/Subjective: Patient is a 59 year old with a past medical history of ischemic cardiac myopathy have an ejection fraction 15%, status post ICD placement, on chronic IV milrinone therapy, status post PICC line placement, having blood cultures drawn that were drawn on 04/17/2015 that grew gram-positive cocci in clusters. Patient was  started on IV vancomycin with removal of PICC line. Medicine consulted for management of hyperglycemia and DM. He was started on IV insulin with blood sugars trending down.   Objective: Filed Vitals:   04/20/15 1446  BP: 110/86  Pulse: 112  Temp: 98 F (36.7 C)  Resp: 20    Intake/Output Summary (Last 24 hours) at 04/20/15 1543 Last data filed at 04/20/15 1446  Gross per 24 hour  Intake 1528.89 ml  Output   2750 ml  Net -1221.11 ml   Filed Weights   04/18/15 1443 04/19/15 0553 04/20/15 0610  Weight: 121.065 kg (266 lb 14.4 oz) 120.4 kg (265 lb 6.9 oz) 120.249 kg (265 lb 1.6 oz)    Exam:   General:  Patient is awake alert, and related down the hallway, states doing well, he is nontoxic appearing  Cardiovascular:  regular rate rhythm, normal S1-S2, no edema   Respiratory: Normal respiratory effort, minimal bibasilar crackles  Abdomen: Soft nontender nondistended  Musculoskeletal: No edema  Data Reviewed: Basic Metabolic Panel:  Recent Labs Lab 04/18/15 1600  04/19/15 04/19/15 0345 04/19/15 0741 04/19/15 1225 04/20/15 0436  NA 129*  < > 136 136 138 135 135  K 4.3  < > 3.5 3.6 3.7 3.9 4.0  CL 91*  < > 97* 100* 102 100* 99*  CO2 24  < > 28 25 26 24 25   GLUCOSE 597*  < > 186* 181* 128* 256* 241*  BUN 23*  < > 25* 25* 23* 24* 20  CREATININE 1.53*  < > 1.34* 1.36* 1.24 1.33* 1.34*  CALCIUM 9.2  < > 9.0 9.2 9.4 9.4 9.2  MG 2.1  --   --   --   --   --   --   < > = values in this interval not  displayed. Liver Function Tests:  Recent Labs Lab 04/18/15 1600  AST 19  ALT 16*  ALKPHOS 130*  BILITOT 0.7  PROT 7.9  ALBUMIN 3.2*   No results for input(s): LIPASE, AMYLASE in the last 168 hours. No results for input(s): AMMONIA in the last 168 hours. CBC:  Recent Labs Lab 04/18/15 1600 04/20/15 0436  WBC 14.5* 14.8*  NEUTROABS 11.8*  --   HGB 13.4 12.5*  HCT 39.6 37.9*  MCV 77.0* 77.2*  PLT 289 277   Cardiac Enzymes: No results for input(s): CKTOTAL,  CKMB, CKMBINDEX, TROPONINI in the last 168 hours. BNP (last 3 results)  Recent Labs  12/31/14 1430  BNP 386.4*    ProBNP (last 3 results) No results for input(s): PROBNP in the last 8760 hours.  CBG:  Recent Labs Lab 04/19/15 1153 04/19/15 1656 04/19/15 2214 04/20/15 0614 04/20/15 1113  GLUCAP 255* 298* 132* 245* 301*    Recent Results (from the past 240 hour(s))  Culture, blood (routine x 2)     Status: None (Preliminary result)   Collection Time: 04/17/15 11:25 AM  Result Value Ref Range Status   Specimen Description BLOOD RIGHT ARM  Final   Special Requests BOTTLES DRAWN AEROBIC AND ANAEROBIC 10CC  Final   Culture NO GROWTH 3 DAYS  Final   Report Status PENDING  Incomplete  Culture, blood (routine x 2)     Status: None (Preliminary result)   Collection Time: 04/17/15 11:25 AM  Result Value Ref Range Status   Specimen Description BLOOD LEFT ARM  Final   Special Requests BOTTLES DRAWN AEROBIC AND ANAEROBIC 10CC  Final   Culture  Setup Time   Final    GRAM POSITIVE COCCI IN CLUSTERS AEROBIC BOTTLE ONLY CRITICAL RESULT CALLED TO, READ BACK BY AND VERIFIED WITH: M BRADLEY,RN AT 1149 04/18/15 BY L BENFIELD GRAM POSITIVE RODS ANAEROBIC BOTTLE ONLY    Culture   Final    STAPHYLOCOCCUS LUGDUNENSIS SENSITIVITIES TO FOLLOW    Report Status PENDING  Incomplete  Culture, blood (routine x 2)     Status: None (Preliminary result)   Collection Time: 04/19/15  5:30 PM  Result Value Ref Range Status   Specimen Description BLOOD LEFT ARM  Final   Special Requests BOTTLES DRAWN AEROBIC AND ANAEROBIC 5CC EACH  Final   Culture NO GROWTH < 24 HOURS  Final   Report Status PENDING  Incomplete  Culture, blood (routine x 2)     Status: None (Preliminary result)   Collection Time: 04/19/15  5:40 PM  Result Value Ref Range Status   Specimen Description BLOOD LEFT ARM  Final   Special Requests BOTTLES DRAWN AEROBIC AND ANAEROBIC 5CC EACH  Final   Culture NO GROWTH < 24 HOURS  Final    Report Status PENDING  Incomplete     Studies: No results found.  Scheduled Meds: . apixaban  5 mg Oral BID  . atorvastatin  40 mg Oral Daily  . carvedilol  3.125 mg Oral BID WC  . digoxin  0.125 mg Oral Daily  . docusate sodium  200 mg Oral Daily  . hydrALAZINE  25 mg Oral 3 times per day  . insulin aspart  0-20 Units Subcutaneous TID WC  . insulin aspart  0-5 Units Subcutaneous QHS  . insulin aspart  8 Units Subcutaneous TID WC  . insulin glargine  48 Units Subcutaneous QHS  . isosorbide mononitrate  30 mg Oral Daily  . magnesium oxide  200 mg Oral BID  .  pantoprazole  40 mg Oral Daily  . potassium chloride SA  40 mEq Oral BID  . sodium chloride  3 mL Intravenous Q12H  . spironolactone  25 mg Oral Daily  . torsemide  40 mg Oral BID  . vancomycin  1,500 mg Intravenous Q24H   Continuous Infusions: . sodium chloride 10 mL/hr at 04/18/15 1847  . milrinone 0.375 mcg/kg/min (04/20/15 9604)    Principal Problem:   Bacteremia associated with intravascular line Active Problems:   CAD (coronary artery disease)   DM type 2, uncontrolled, with renal complications   Acute on chronic systolic and diastolic heart failure, NYHA class 4   CKD (chronic kidney disease), stage III   Diabetic ketoacidosis without coma associated with type 2 diabetes mellitus   Uncontrolled type 2 DM with hyperosmolar nonketotic hyperglycemia    Time spent:    Jeralyn Bennett  [facility] Pager [phone]. If 7PM-7AM, please contact night-coverage at [email or web address], password [facility] 04/20/2015, 3:43 PM  LOS: 2 days

## 2015-04-20 NOTE — Progress Notes (Signed)
Patient ID: Daniel Blankenship, male   DOB: May 12, 1956, 59 y.o.   MRN: [id number]         [facility]    Date of Admission:  04/18/2015           Day 2 vancomycin  Principal Problem:   Bacteremia associated with intravascular line Active Problems:   CAD (coronary artery disease)   DM type 2, uncontrolled, with renal complications   Acute on chronic systolic and diastolic heart failure, NYHA class 4   CKD (chronic kidney disease), stage III   Diabetic ketoacidosis without coma associated with type 2 diabetes mellitus   Uncontrolled type 2 DM with hyperosmolar nonketotic hyperglycemia   . apixaban  5 mg Oral BID  . atorvastatin  40 mg Oral Daily  . carvedilol  3.125 mg Oral BID WC  . digoxin  0.125 mg Oral Daily  . docusate sodium  200 mg Oral Daily  . hydrALAZINE  25 mg Oral 3 times per day  . insulin aspart  0-20 Units Subcutaneous TID WC  . insulin aspart  0-5 Units Subcutaneous QHS  . insulin aspart  10 Units Subcutaneous TID WC  . insulin glargine  48 Units Subcutaneous QHS  . isosorbide mononitrate  30 mg Oral Daily  . magnesium oxide  200 mg Oral BID  . pantoprazole  40 mg Oral Daily  . potassium chloride SA  40 mEq Oral BID  . sodium chloride  3 mL Intravenous Q12H  . spironolactone  25 mg Oral Daily  . torsemide  40 mg Oral BID  . vancomycin  1,500 mg Intravenous Q24H    Past Medical History  Diagnosis Date  . Atrial flutter with rapid ventricular response   . Vascular dementia   . CHF (congestive heart failure)   . Delirium   . Ischemic cardiomyopathy   . Septic shock 12/2014  . CAD (coronary artery disease)   . Anemia   . Hypertension   . Hyperlipidemia   . Gout of hand   . [name] (automatic cardioverter/defibrillator) present   . Myocardial infarction 1979?  Marland Kitchen Pneumonia     "maybe 3 times" (04/18/2015)  . IDDM (insulin dependent diabetes mellitus)   . History of blood transfusion 1982    "related to crushed leg/knee"  .  Arthritis     "hands, knees, ankles" (04/18/2015)  . Anxiety   . Panic attacks   . Acute renal failure   . 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"    History  Substance Use Topics  . Smoking status: Former Smoker -- 1.50 packs/day for 14 years    Types: Cigarettes  . Smokeless tobacco: Never Used     Comment: 04/18/2015 "stopped smoking in ~ 2000"  . Alcohol Use: Yes     Comment: 04/18/2015 "stopped drinking in the 1980's    Family History  Problem Relation Age of Onset  . Family history unknown: Yes   Allergies  Allergen Reactions  . Sulfa Antibiotics Anaphylaxis and Hives  . Sulfamethoxazole Anaphylaxis and Hives    unknown  . Zocor [Simvastatin] Other (See Comments)    Swelling, trouble breathing.  . Lisinopril Rash    Other reaction(s): Angioedema (ALLERGY/intolerance)    OBJECTIVE: Blood pressure 110/86, pulse 112, temperature 98 F (36.7 C), temperature source Oral, resp. rate 20, height  (1.905 m), weight 265 lb 1.6 ozANDRIK Blankenship 100 %. [name] He is sound asleep so I did  not wake him  Lab Results Lab Results  Component Value Date   WBC 14.8* 04/20/2015   HGB 12.5* 04/20/2015   HCT 37.9* 04/20/2015   MCV 77.2* 04/20/2015   PLT 277 04/20/2015    Lab Results  Component Value Date   CREATININE 1.34* 04/20/2015   BUN 20 04/20/2015   NA 135 04/20/2015   K 4.0 04/20/2015   CL 99* 04/20/2015   CO2 25 04/20/2015    Lab Results  Component Value Date   ALT 16* 04/18/2015   AST 19 04/18/2015   ALKPHOS 130* 04/18/2015   BILITOT 0.7 04/18/2015     Microbiology: Recent Results (from the past 240 hour(s))  Culture, blood (routine x 2)     Status: None (Preliminary result)   Collection Time: 04/17/15 11:25 AM  Result Value Ref Range Status   Specimen Description BLOOD RIGHT ARM  Final   Special Requests BOTTLES DRAWN AEROBIC AND ANAEROBIC 10CC  Final   Culture NO GROWTH 3 DAYS  Final   Report Status PENDING  Incomplete    Culture, blood (routine x 2)     Status: None (Preliminary result)   Collection Time: 04/17/15 11:25 AM  Result Value Ref Range Status   Specimen Description BLOOD LEFT ARM  Final   Special Requests BOTTLES DRAWN AEROBIC AND ANAEROBIC 10CC  Final   Culture  Setup Time   Final    GRAM POSITIVE COCCI IN CLUSTERS AEROBIC BOTTLE ONLY CRITICAL RESULT CALLED TO, READ BACK BY AND VERIFIED WITH: M BRADLEY,RN AT 1149 04/18/15 BY L BENFIELD GRAM POSITIVE RODS ANAEROBIC BOTTLE ONLY    Culture   Final    STAPHYLOCOCCUS LUGDUNENSIS SENSITIVITIES TO FOLLOW    Report Status PENDING  Incomplete  Culture, blood (routine x 2)     Status: None (Preliminary result)   Collection Time: 04/19/15  5:30 PM  Result Value Ref Range Status   Specimen Description BLOOD LEFT ARM  Final   Special Requests BOTTLES DRAWN AEROBIC AND ANAEROBIC 5CC EACH  Final   Culture NO GROWTH < 24 HOURS  Final   Report Status PENDING  Incomplete  Culture, blood (routine x 2)     Status: None (Preliminary result)   Collection Time: 04/19/15  5:40 PM  Result Value Ref Range Status   Specimen Description BLOOD LEFT ARM  Final   Special Requests BOTTLES DRAWN AEROBIC AND ANAEROBIC 5CC EACH  Final   Culture NO GROWTH < 24 HOURS  Final   Report Status PENDING  Incomplete    Assessment: He has one positive blood culture growing Staph lugdunensis, a coagulase-negative staph species, but one with virulence factors similar to Staph aureus. It produces biofilm and is known to cause endocarditis. Therefore, I would recommend proceeding with TEE to help determine if his ICD is infected.  Plan: 1. Continue vancomycin pending antibiotic susceptibility results 2. Await results of repeat blood cultures 3. Recommend TEE  Daniel AstersJohn Ahleah Simko, MD [facility] [phone] pager   [phone] cell 04/20/2015, 4:08 PM

## 2015-04-20 NOTE — Progress Notes (Signed)
Advanced Heart Failure Rounding Note   Subjective:    Admitted with bacteremia likely associated with PICC. PICC removed 7/6 with PIV started. 1 of 2 blood cultures Gram + cocci. Started on Vancomycin. ID consulted. Blood cultures repeated 7/7.   Glucose uncontrolled- [facility] consulted.   Refuses advanced therapy work up.   Denies SOB.    Objective:   Weight Range:  Vital Signs:   Temp:  [97.9 F (36.6 C)-98.2 F (36.8 C)] 98.1 F (36.7 C) (07/08 0007) Pulse Rate:  [105-110] 105 (07/08 0007) Resp:  [16-20] 16 (07/08 0007) BP: (88-128)/(59-81) 118/81 mmHg (07/08 0610) SpO2:  [91 %-100 %] 100 % (07/08 0610) Weight:  [265 lb 1.6 oz (120.249 kg)] 265 lb 1.6 oz (120.249 kg) (07/08 0610) Last BM Date: 04/19/15  Weight change: Filed Weights   04/18/15 1443 04/19/15 0553 04/20/15 0610  Weight: 266 lb 14.4 oz (121.065 kg) 265 lb 6.9 oz (120.4 kg) 265 lb 1.6 oz (120.249 kg)    Intake/Output:   Intake/Output Summary (Last 24 hours) at 04/20/15 0804 Last data filed at 04/20/15 4540  Gross per 24 hour  Intake 1288.89 ml  Output   1525 ml  Net -236.11 ml     Physical Exam: General:  Well appearing. No resp difficulty. In bed.  HEENT: normal Neck: supple. JVP 5-6  . Carotids 2+ bilat; no bruits. No lymphadenopathy or thryomegaly appreciated. Cor: PMI nondisplaced. Regular rate & rhythm. No rubs, or murmurs. + S3  Lungs: clear Abdomen: soft, nontender, nondistended. No hepatosplenomegaly. No bruits or masses. Good bowel sounds. Extremities: no cyanosis, clubbing, rash, edema Neuro: alert & orientedx3, cranial nerves grossly intact. moves all 4 extremities w/o difficulty. Affect pleasant  Telemetry: Sinus Tach 100s   Labs: Basic Metabolic Panel:  Recent Labs Lab 04/18/15 1600  04/19/15 04/19/15 0345 04/19/15 0741 04/19/15 1225 04/20/15 0436  NA 129*  < > 136 136 138 135 135  K 4.3  < > 3.5 3.6 3.7 3.9 4.0  CL 91*  < > 97* 100* 102 100* 99*  CO2 24  < > 28 25 26 24  25   GLUCOSE 597*  < > 186* 181* 128* 256* 241*  BUN 23*  < > 25* 25* 23* 24* 20  CREATININE 1.53*  < > 1.34* 1.36* 1.24 1.33* 1.34*  CALCIUM 9.2  < > 9.0 9.2 9.4 9.4 9.2  MG 2.1  --   --   --   --   --   --   < > = values in this interval not displayed.  Liver Function Tests:  Recent Labs Lab 04/18/15 1600  AST 19  ALT 16*  ALKPHOS 130*  BILITOT 0.7  PROT 7.9  ALBUMIN 3.2*   No results for input(s): LIPASE, AMYLASE in the last 168 hours. No results for input(s): AMMONIA in the last 168 hours.  CBC:  Recent Labs Lab 04/18/15 1600 04/20/15 0436  WBC 14.5* 14.8*  NEUTROABS 11.8*  --   HGB 13.4 12.5*  HCT 39.6 37.9*  MCV 77.0* 77.2*  PLT 289 277    Cardiac Enzymes: No results for input(s): CKTOTAL, CKMB, CKMBINDEX, TROPONINI in the last 168 hours.  BNP: BNP (last 3 results)  Recent Labs  12/31/14 1430  BNP 386.4*    ProBNP (last 3 results) No results for input(s): PROBNP in the last 8760 hours.    Other results:  Imaging: No results found.   Medications:     Scheduled Medications: . apixaban  5 mg Oral  BID  . atorvastatin  40 mg Oral Daily  . digoxin  0.125 mg Oral Daily  . docusate sodium  200 mg Oral Daily  . hydrALAZINE  25 mg Oral 3 times per day  . insulin aspart  0-20 Units Subcutaneous TID WC  . insulin aspart  0-5 Units Subcutaneous QHS  . insulin aspart  8 Units Subcutaneous TID WC  . insulin glargine  48 Units Subcutaneous QHS  . isosorbide mononitrate  30 mg Oral Daily  . magnesium oxide  200 mg Oral BID  . pantoprazole  40 mg Oral Daily  . potassium chloride SA  40 mEq Oral BID  . sodium chloride  3 mL Intravenous Q12H  . spironolactone  25 mg Oral Daily  . torsemide  40 mg Oral BID  . vancomycin  1,500 mg Intravenous Q24H    Infusions: . sodium chloride 10 mL/hr at 04/18/15 1847  . milrinone 0.375 mcg/kg/min (04/20/15 0009)    PRN Medications: sodium chloride, acetaminophen, dextrose, ondansetron (ZOFRAN) IV,  oxyCODONE-acetaminophen, sodium chloride   Assessment/Plan    Daniel Blankenship is a 59 year old with chronic heart failure on home milrinone at 0.375 mcg, admitted due to gram + cocci noted blood cultures 04/17/15.   1. Bacteremia: Blood Cultures obtained 7/5 with gram + cocci noted in 1 of 2.  Suspect PICC infection.  PICC removed 7/6 . PIV for home milrinone and antibiotics. Has St Jude ICD. May need TEE depending on speciation. ID appreciated.  Blood cultures repeated 7/7. Afebrile. WBC 14.8  Day 3 of vanc. Pharmacy dosing. Has been [facility] by [facility] for home milrinone. Will need another PICC when ID oks. 2. 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 Most recent ECHO 12/2014 with EF 15%. On home milrinone 0.375 mcg.  No dyspnea, volume status looks ok.  - Continue dig 0.125 mg daily--> Dig level 0.4.    - Volume status stable today. Continue torsemide 40 mg bid with 40 meq potassium  - He is not on an Ace due to angioedema.  - Will add low dose Coreg today, hopefully will tolerate.  - Wants to hold off on LVAD evaluation for now, wants to think about it.  3. A flutter: S/P AFL ablation 01/05/2015. On Eliquis 5 mg twice a day.   4. CKD: Creatinine 1.34, stable.  5. CAD: H/O PCI. No chest pain. On eliquis so no aspirin.  Continue statin. 6. Gout: on colchicine.  7. DMII: Uncontrolled Diabetes. Admitted with hyperglycemic nonketotic state.  Glucose > 600.  Hgb A1C 12.2  [facility] Hospitalist consulted. Off insulin drip and back home regimen + meal coverage.   8. ST Jude ICD  Length of Stay: 2  Daniel Blankenship,Daniel Blankenship  04/20/2015, 8:04 AM  Advanced Heart Failure Team Pager [phone] (M-F; 7a - 4p)  Please contact [facility] for night-coverage after hours (4p -7a ) and weekends on [email or web address]  Patient seen with NP, agree with the above note.   PICC-related bacteremia:  1st blood cultures have grown Staph lugdunensis.  This is a form of coagulase negative  staph, but is a greater endocarditis risk than other forms of CNS.   - Continue vancomycin.  - Repeat blood cultures drawn yesterday, need to see these negative prior to replacing PICC.  - Echo yesterday was difficult study/poor images.  Will await ID input on need for TEE.  He has an ICD.   Stable CHF, on home milrinone.  Walked in  hall without dyspnea, does not appear volume overloaded.  Given persistent tachycardia, think we can add low dose Coreg today.  I talked to him about LVAD, wants to think about this.  Barriers would be social support and poorly controlled DM.   Hyperglycemic nonketotic state: Per [facility].   Daniel Blankenship 04/20/2015 11:44 AM

## 2015-04-21 DIAGNOSIS — T827XXA Infection and inflammatory reaction due to other cardiac and vascular devices, implants and grafts, initial encounter: Secondary | ICD-10-CM

## 2015-04-21 DIAGNOSIS — Z959 Presence of cardiac and vascular implant and graft, unspecified: Secondary | ICD-10-CM

## 2015-04-21 DIAGNOSIS — I2510 Atherosclerotic heart disease of native coronary artery without angina pectoris: Secondary | ICD-10-CM

## 2015-04-21 DIAGNOSIS — N189 Chronic kidney disease, unspecified: Secondary | ICD-10-CM

## 2015-04-21 DIAGNOSIS — R7881 Bacteremia: Secondary | ICD-10-CM | POA: Insufficient documentation

## 2015-04-21 DIAGNOSIS — B957 Other staphylococcus as the cause of diseases classified elsewhere: Secondary | ICD-10-CM

## 2015-04-21 DIAGNOSIS — I509 Heart failure, unspecified: Secondary | ICD-10-CM

## 2015-04-21 LAB — BASIC METABOLIC PANEL WITH GFR
Anion gap: 11 (ref 5–15)
BUN: 21 mg/dL — ABNORMAL HIGH (ref 6–20)
CO2: 26 mmol/L (ref 22–32)
Calcium: 9 mg/dL (ref 8.9–10.3)
Chloride: 98 mmol/L — ABNORMAL LOW (ref 101–111)
Creatinine, Ser: 1.47 mg/dL — ABNORMAL HIGH (ref 0.61–1.24)
GFR calc Af Amer: 59 mL/min — ABNORMAL LOW
GFR calc non Af Amer: 50 mL/min — ABNORMAL LOW
Glucose, Bld: 260 mg/dL — ABNORMAL HIGH (ref 65–99)
Potassium: 4.2 mmol/L (ref 3.5–5.1)
Sodium: 135 mmol/L (ref 135–145)

## 2015-04-21 LAB — GLUCOSE, CAPILLARY
GLUCOSE-CAPILLARY: 282 mg/dL — AB (ref 65–99)
GLUCOSE-CAPILLARY: 223 mg/dL — AB (ref 65–99)
GLUCOSE-CAPILLARY: 194 mg/dL — AB (ref 65–99)
Glucose-Capillary: 257 mg/dL — ABNORMAL HIGH (ref 65–99)

## 2015-04-21 LAB — CBC
HCT: 35 % — ABNORMAL LOW (ref 39.0–52.0)
Hemoglobin: 11.6 g/dL — ABNORMAL LOW (ref 13.0–17.0)
MCH: 25.5 pg — ABNORMAL LOW (ref 26.0–34.0)
MCHC: 33.1 g/dL (ref 30.0–36.0)
MCV: 76.9 fL — ABNORMAL LOW (ref 78.0–100.0)
Platelets: 265 K/uL (ref 150–400)
RBC: 4.55 MIL/uL (ref 4.22–5.81)
RDW: 14 % (ref 11.5–15.5)
WBC: 14 K/uL — ABNORMAL HIGH (ref 4.0–10.5)

## 2015-04-21 MED ORDER — RIFAMPIN 600 MG IV SOLR
300.0000 mg | Freq: Three times a day (TID) | INTRAVENOUS | Status: DC
  Administered 2015-04-21 – 2015-04-23 (×6): 300 mg via INTRAVENOUS
  Filled 2015-04-21 (×9): qty 300

## 2015-04-21 MED ORDER — CEFAZOLIN SODIUM-DEXTROSE 2-3 GM-% IV SOLR
2.0000 g | Freq: Three times a day (TID) | INTRAVENOUS | Status: DC
  Administered 2015-04-21 – 2015-04-24 (×9): 2 g via INTRAVENOUS
  Filled 2015-04-21 (×12): qty 50

## 2015-04-21 MED ORDER — INSULIN GLARGINE 100 UNIT/ML ~~LOC~~ SOLN
52.0000 [IU] | Freq: Every day | SUBCUTANEOUS | Status: DC
  Filled 2015-04-21: qty 0.52

## 2015-04-21 MED ORDER — INSULIN GLARGINE 100 UNIT/ML ~~LOC~~ SOLN
55.0000 [IU] | Freq: Every day | SUBCUTANEOUS | Status: DC
  Administered 2015-04-21: 55 [IU] via SUBCUTANEOUS
  Filled 2015-04-21 (×3): qty 0.55

## 2015-04-21 MED ORDER — INSULIN ASPART 100 UNIT/ML ~~LOC~~ SOLN
12.0000 [IU] | Freq: Three times a day (TID) | SUBCUTANEOUS | Status: DC
  Administered 2015-04-21 – 2015-04-24 (×8): 12 [IU] via SUBCUTANEOUS

## 2015-04-21 NOTE — Progress Notes (Signed)
Subjective:    Admitted with bacteremia likely associated with PICC. PICC removed 7/6 with PIV started. Blood cultures Staph lugdunensis.  This is a form of coagulase negative staph, but is a greater endocarditis risk than other forms of CNS.   ID consulted.   Glucose uncontrolled- [facility] consulted.   Refuses advanced therapy work up.   Denies SOB.    Objective:   Weight Range:  Vital Signs:   Temp:  [97.9 F (36.6 C)-98.2 F (36.8 C)] 97.9 F (36.6 C) (07/09 0619) Pulse Rate:  [108-117] 108 (07/09 1017) Resp:  [18-20] 20 (07/09 0619) BP: (98-110)/(53-86) 99/53 mmHg (07/09 0619) SpO2:  [97 %-100 %] 100 % (07/09 0619) Weight:  [264 lb 3.2 oz (119.84 kg)] 264 lb 3.2 oz (119.84 kg) (07/09 0619) Last BM Date: 04/20/15  Weight change: Filed Weights   04/19/15 0553 04/20/15 0610 04/21/15 0619  Weight: 265 lb 6.9 oz (120.4 kg) 265 lb 1.6 oz (120.249 kg) 264 lb 3.2 oz (119.84 kg)    Intake/Output:   Intake/Output Summary (Last 24 hours) at 04/21/15 1235 Last data filed at 04/21/15 1100  Gross per 24 hour  Intake 1537.51 ml  Output   3000 ml  Net -1462.49 ml     Physical Exam: General:  Well appearing. No resp difficulty. In bed.  HEENT: normal Neck: supple. JVP 5-6  . Carotids 2+ bilat; no bruits. No lymphadenopathy or thryomegaly appreciated. Cor: PMI nondisplaced. Regular rate & rhythm. No rubs, or murmurs. + S3  Lungs: clear Abdomen: soft, nontender, nondistended. No hepatosplenomegaly. No bruits or masses. Good bowel sounds. Extremities: no cyanosis, clubbing, rash, edema Neuro: alert & orientedx3, cranial nerves grossly intact. moves all 4 extremities w/o difficulty. Affect pleasant  Telemetry: Sinus Tach 100s   Labs: Basic Metabolic Panel:  Recent Labs Lab 04/18/15 1600  04/19/15 0345 04/19/15 0741 04/19/15 1225 04/20/15 0436 04/21/15 0315  NA 129*  < > 136 138 135 135 135  K 4.3  < > 3.6 3.7 3.9 4.0 4.2  CL 91*  < > 100* 102 100* 99* 98*  CO2 24  <  > 25 26 24 25 26   GLUCOSE 597*  < > 181* 128* 256* 241* 260*  BUN 23*  < > 25* 23* 24* 20 21*  CREATININE 1.53*  < > 1.36* 1.24 1.33* 1.34* 1.47*  CALCIUM 9.2  < > 9.2 9.4 9.4 9.2 9.0  MG 2.1  --   --   --   --   --   --   < > = values in this interval not displayed.  Liver Function Tests:  Recent Labs Lab 04/18/15 1600  AST 19  ALT 16*  ALKPHOS 130*  BILITOT 0.7  PROT 7.9  ALBUMIN 3.2*   No results for input(s): LIPASE, AMYLASE in the last 168 hours. No results for input(s): AMMONIA in the last 168 hours.  CBC:  Recent Labs Lab 04/18/15 1600 04/20/15 0436 04/21/15 0315  WBC 14.5* 14.8* 14.0*  NEUTROABS 11.8*  --   --   HGB 13.4 12.5* 11.6*  HCT 39.6 37.9* 35.0*  MCV 77.0* 77.2* 76.9*  PLT 289 277 265    Cardiac Enzymes: No results for input(s): CKTOTAL, CKMB, CKMBINDEX, TROPONINI in the last 168 hours.  BNP: BNP (last 3 results)  Recent Labs  12/31/14 1430  BNP 386.4*    ProBNP (last 3 results) No results for input(s): PROBNP in the last 8760 hours.    Other results:  Imaging: No results found.  Medications:     Scheduled Medications: . apixaban  5 mg Oral BID  . atorvastatin  40 mg Oral Daily  . carvedilol  3.125 mg Oral BID WC  .  ceFAZolin (ANCEF) IV  2 g Intravenous Q8H  . digoxin  0.125 mg Oral Daily  . docusate sodium  200 mg Oral Daily  . hydrALAZINE  25 mg Oral 3 times per day  . insulin aspart  0-20 Units Subcutaneous TID WC  . insulin aspart  0-5 Units Subcutaneous QHS  . insulin aspart  12 Units Subcutaneous TID WC  . insulin glargine  52 Units Subcutaneous QHS  . isosorbide mononitrate  30 mg Oral Daily  . magnesium oxide  200 mg Oral BID  . pantoprazole  40 mg Oral Daily  . potassium chloride SA  40 mEq Oral BID  . rifampin (RIFADIN) IVPB  300 mg Intravenous 3 times per day  . sodium chloride  3 mL Intravenous Q12H  . spironolactone  25 mg Oral Daily  . torsemide  40 mg Oral BID    Infusions: . sodium chloride 10  mL/hr at 04/18/15 1847  . milrinone 0.375 mcg/kg/min (04/21/15 0606)    PRN Medications: sodium chloride, acetaminophen, dextrose, ondansetron (ZOFRAN) IV, oxyCODONE-acetaminophen, sodium chloride   Assessment/Plan    Mr Kauffmann is a 59 year old with chronic heart failure on home milrinone at 0.375 mcg, admitted due to gram + cocci noted blood cultures 04/17/15.   1. Bacteremia: Blood Cultures obtained 7/5 with gram + cocci noted in 1 of 2.  Suspect PICC infection.  PICC removed 7/6 . PIV for home milrinone and antibiotics. Has St Jude ICD. TEE likely Monday. ID appreciated.  Blood cultures repeated 7/7. Afebrile. Ancef/ rifiampin. Pharmacy dosing. Has been [facility] milrinone. Will need another PICC when ID oks. 2. 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 Most recent ECHO 12/2014 with EF 15%. On home milrinone 0.375 mcg.  No dyspnea, volume status looks ok.  - Coreg 3.125mg  BID started 7/8 tolerated  - Continue dig 0.125 mg daily--> Dig level 0.4.    - Volume status stable today. Continue torsemide 40 mg bid with 40 meq potassium  - He is not on an Ace due to angioedema.  - Will add low dose Coreg today, hopefully will tolerate.  - Wants to hold off on LVAD evaluation for now, wants to think about it.  3. A flutter: S/P AFL ablation 01/05/2015. On Eliquis 5 mg twice a day.   4. CKD: Creatinine 1.34>>1.47 mildly increased, minitor.  5. CAD: H/O PCI. No chest pain. On eliquis so no aspirin.  Continue statin. 6. Gout: on colchicine.  7. DMII: Uncontrolled Diabetes. Admitted with hyperglycemic nonketotic state.  Glucose > 600.  Hgb A1C 12.2  [facility] Hospitalist consulted. Off insulin drip and back home regimen + meal coverage.   8. ST Jude ICD  TEE Monday.    Donato Schultz 04/21/2015 12:35 PM

## 2015-04-21 NOTE — Progress Notes (Addendum)
[facility]    Date of Admission:  04/18/2015   Total days of antibiotics 4        Day 4 vanco           ID: Daniel Blankenship is a 59 y.o. male with ICD, CKD3, on milrinone for chronic heart failure found to have staph lugdunensis bacteremia  Principal Problem:   Bacteremia associated with intravascular line Active Problems:   CAD (coronary artery disease)   DM type 2, uncontrolled, with renal complications   Acute on chronic systolic and diastolic heart failure, NYHA class 4   CKD (chronic kidney disease), stage III   Diabetic ketoacidosis without coma associated with type 2 diabetes mellitus   Uncontrolled type 2 DM with hyperosmolar nonketotic hyperglycemia    Subjective: Remains afebrile  Medications:  . apixaban  5 mg Oral BID  . atorvastatin  40 mg Oral Daily  . carvedilol  3.125 mg Oral BID WC  . digoxin  0.125 mg Oral Daily  . docusate sodium  200 mg Oral Daily  . hydrALAZINE  25 mg Oral 3 times per day  . insulin aspart  0-20 Units Subcutaneous TID WC  . insulin aspart  0-5 Units Subcutaneous QHS  . insulin aspart  12 Units Subcutaneous TID WC  . insulin glargine  52 Units Subcutaneous QHS  . isosorbide mononitrate  30 mg Oral Daily  . magnesium oxide  200 mg Oral BID  . pantoprazole  40 mg Oral Daily  . potassium chloride SA  40 mEq Oral BID  . rifampin (RIFADIN) IVPB  300 mg Intravenous 3 times per day  . sodium chloride  3 mL Intravenous Q12H  . spironolactone  25 mg Oral Daily  . torsemide  40 mg Oral BID    Objective: Vital signs in last 24 hours: Temp:  [97.9 F (36.6 C)-98.2 F (36.8 C)] 97.9 F (36.6 C) (07/09 0619) Pulse Rate:  [108-117] 108 (07/09 1017) Resp:  [18-20] 20 (07/09 0619) BP: (98-110)/(53-86) 99/53 mmHg (07/09 0619) SpO2:  [97 %-100 %] 100 % (07/09 0619) Weight:  [264 lb 3.2 oz (119.84 kg)] 264 lb 3.2 oz (119.84 kg) (07/09 16100619)   Did not examine, a way from room  Lab Results  Recent Labs  04/20/15 0436 04/21/15 0315  WBC 14.8* 14.0*  HGB 12.5* 11.6*  HCT 37.9* 35.0*  NA 135 135  K 4.0 4.2  CL 99* 98*  CO2 25 26  BUN 20 21*  CREATININE 1.34* 1.47*   Liver Panel  Recent Labs  04/18/15 1600  PROT 7.9  ALBUMIN 3.2*  AST 19  ALT 16*  ALKPHOS 130*  BILITOT 0.7    Microbiology: 7/5 s. lugdunensis in 1 of 2 sets = oxa s, rif s, gent s, vanco s Studies/Results: No results found.   Assessment/Plan: Staph lugdunensis bacteremia = This species is known to be as aggressive as staph aureus. We will change his antibiotics to cefazolin plus rifampin. Will d/c vanco. Will need TEE to evaluate for endocarditis, this will also help us determine if need to add gentamicin and overall course of therapy. Repeat blood cx on 7/7 NGTD at 48hrs thus far.  Ckd= will renally dose cefazolin. Cefazolin over nafcillin due to decrease volume and minimizing chf  Chronic heart failure = defer to cardiology team for management.   Drue SecondSNIDER, [facility] Hospital[facility]NTHIA [facility] Cell: [phone] Pager: [phone]  04/21/2015, 10:39 AM

## 2015-04-21 NOTE — Progress Notes (Signed)
[facility] PROGRESS NOTE  Daniel Blankenship:[id number] DOB: September 20, 1956 DOA: 04/18/2015 PCP: PROVIDER NOT IN SYSTEM  Assessment/Plan: 1. 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 BMP revealed anion gap less than 12 with a bicarbonate of 25 -Underlying Infectious process likely increasing insulin requirement.  -Initially placed on IV insulin with blood sugars trending down. This morning he was transitioned to his home dose of Lantus at 45 units subcutaneous daily with discontinuation of IV insulin -Blood sugars maintaining in the 200's range -I increased his Lantus to 55 units [place] q daily and mealtime coverage to 12 units  2. Probable Infected line -Patient having a PICC line previously placed for administration of IV milrinone, having positive blood cultures -Infectious disease was consulted, PICC line was removed as patient was started on IV vancomycin. -Blood cultures drawn on 04/17/2015 growing Staph lugdunensis -Blood cultures repeated on 04/19/2015 showing no growth to date.  -ID following. AB's changed to Rifampin 300 mg TID and Ancef 2 g TID -Plan for TEE  3.  Ischemic cardiomyopathy -Patient with history of ischemic cardiomyopathy, with transthoracic echocardiogram performed on 01/05/2015 showing ejection fraction of 15% with diffuse hypokinesis. Status post [name] placement. -Repeat transthoracic echocardiogram performed on 04/19/2015 showing poor image quality, however no obvious vegetation seen. -cardiology managing  Code Status: Full code Family Communication:  Disposition Plan:    HPI/Subjective: Patient is a 59 year old with a past medical history of ischemic cardiac myopathy have an ejection fraction 15%, status post ICD placement, on chronic IV milrinone therapy, status post PICC line placement, having blood cultures drawn that were  drawn on 04/17/2015 that grew gram-positive cocci in clusters. Patient was started on IV vancomycin with removal of PICC line. Medicine consulted for management of hyperglycemia and DM. He was started on IV insulin with blood sugars trending down.   Objective: Filed Vitals:   04/21/15 1402  BP: 115/83  Pulse: 107  Temp: 98.1 F (36.7 C)  Resp: 20    Intake/Output Summary (Last 24 hours) at 04/21/15 1735 Last data filed at 04/21/15 1336  Gross per 24 hour  Intake 1537.51 ml  Output   3025 ml  Net -1487.49 ml   Filed Weights   04/19/15 0553 04/20/15 0610 04/21/15 0619  Weight: 120.4 kg (265 lb 6.9 oz) 120.249 kg (265 lb 1.6 oz) 119.84 kg (264 lb 3.2 oz)    Exam:   General:  Patient is awake alert, and related down the hallway, states doing well, he is nontoxic appearing  Cardiovascular:  regular rate rhythm, normal S1-S2, no edema   Respiratory: Normal respiratory effort, minimal bibasilar crackles  Abdomen: Soft nontender nondistended  Musculoskeletal: No edema  Data Reviewed: Basic Metabolic Panel:  Recent Labs Lab 04/18/15 1600  04/19/15 0345 04/19/15 0741 04/19/15 1225 04/20/15 0436 04/21/15 0315  NA 129*  < > 136 138 135 135 135  K 4.3  < > 3.6 3.7 3.9 4.0 4.2  CL 91*  < > 100* 102 100* 99* 98*  CO2 24  < > GLUCOSE 597*  < > 181* 128* 256* 241* 260*  BUN 23*  < > 25* 23* 24* 20 21*  CREATININE 1.53*  < > 1.36* 1.24 1.33* 1.34* 1.47*  CALCIUM 9.2  < > 9.2 9.4 9.4 9.2 9.0  MG 2.1  --   --   --   --   --   --   < > =  values in this interval not displayed. Liver Function Tests:  Recent Labs Lab 04/18/15 1600  AST 19  ALT 16*  ALKPHOS 130*  BILITOT 0.7  PROT 7.9  ALBUMIN 3.2*   No results for input(s): LIPASE, AMYLASE in the last 168 hours. No results for input(s): AMMONIA in the last 168 hours. CBC:  Recent Labs Lab 04/18/15 1600 04/20/15 0436 04/21/15 0315  WBC 14.5* 14.8* 14.0*  NEUTROABS 11.8*  --   --   HGB 13.4 12.5*  11.6*  HCT 39.6 37.9* 35.0*  MCV 77.0* 77.2* 76.9*  PLT 289 277 265   Cardiac Enzymes: No results for input(s): CKTOTAL, CKMB, CKMBINDEX, TROPONINI in the last 168 hours. BNP (last 3 results)  Recent Labs  12/31/14 1430  BNP 386.4*    ProBNP (last 3 results) No results for input(s): PROBNP in the last 8760 hours.  CBG:  Recent Labs Lab 04/20/15 1641 04/20/15 2211 04/21/15 0558 04/21/15 1124 04/21/15 1604  GLUCAP 199* 278* 257* 282* 223*    Recent Results (from the past 240 hour(s))  Culture, blood (routine x 2)     Status: None (Preliminary result)   Collection Time: 04/17/15 11:25 AM  Result Value Ref Range Status   Specimen Description BLOOD RIGHT ARM  Final   Special Requests BOTTLES DRAWN AEROBIC AND ANAEROBIC 10CC  Final   Culture NO GROWTH 4 DAYS  Final   Report Status PENDING  Incomplete  Culture, blood (routine x 2)     Status: None (Preliminary result)   Collection Time: 04/17/15 11:25 AM  Result Value Ref Range Status   Specimen Description BLOOD LEFT ARM  Final   Special Requests BOTTLES DRAWN AEROBIC AND ANAEROBIC 10CC  Final   Culture  Setup Time   Final    GRAM POSITIVE COCCI IN CLUSTERS AEROBIC BOTTLE ONLY CRITICAL RESULT CALLED TO, READ BACK BY AND VERIFIED WITH: M BRADLEY,RN AT 1149 04/18/15 BY L BENFIELD GRAM POSITIVE RODS ANAEROBIC BOTTLE ONLY    Culture STAPHYLOCOCCUS LUGDUNENSIS  Final   Report Status PENDING  Incomplete   Organism ID, Bacteria STAPHYLOCOCCUS LUGDUNENSIS  Final      Susceptibility   Staphylococcus lugdunensis - MIC*    CIPROFLOXACIN <=0.5 SENSITIVE Sensitive     ERYTHROMYCIN <=0.25 SENSITIVE Sensitive     GENTAMICIN <=0.5 SENSITIVE Sensitive     OXACILLIN 0.5 SENSITIVE Sensitive     TETRACYCLINE <=1 SENSITIVE Sensitive     VANCOMYCIN <=0.5 SENSITIVE Sensitive     TRIMETH/SULFA <=10 SENSITIVE Sensitive     CLINDAMYCIN <=0.25 SENSITIVE Sensitive     RIFAMPIN <=0.5 SENSITIVE Sensitive     Inducible Clindamycin NEGATIVE  Sensitive     * STAPHYLOCOCCUS LUGDUNENSIS  Culture, blood (routine x 2)     Status: None (Preliminary result)   Collection Time: 04/19/15  5:30 PM  Result Value Ref Range Status   Specimen Description BLOOD LEFT ARM  Final   Special Requests BOTTLES DRAWN AEROBIC AND ANAEROBIC 5CC EACH  Final   Culture NO GROWTH 2 DAYS  Final   Report Status PENDING  Incomplete  Culture, blood (routine x 2)     Status: None (Preliminary result)   Collection Time: 04/19/15  5:40 PM  Result Value Ref Range Status   Specimen Description BLOOD LEFT ARM  Final   Special Requests BOTTLES DRAWN AEROBIC AND ANAEROBIC 5CC EACH  Final   Culture NO GROWTH 2 DAYS  Final   Report Status PENDING  Incomplete     Studies: No results  found.  Scheduled Meds: . apixaban  5 mg Oral BID  . atorvastatin  40 mg Oral Daily  . carvedilol  3.125 mg Oral BID WC  .  ceFAZolin (ANCEF) IV  2 g Intravenous Q8H  . digoxin  0.125 mg Oral Daily  . docusate sodium  200 mg Oral Daily  . hydrALAZINE  25 mg Oral 3 times per day  . insulin aspart  0-20 Units Subcutaneous TID WC  . insulin aspart  0-5 Units Subcutaneous QHS  . insulin aspart  12 Units Subcutaneous TID WC  . insulin glargine  52 Units Subcutaneous QHS  . isosorbide mononitrate  30 mg Oral Daily  . magnesium oxide  200 mg Oral BID  . pantoprazole  40 mg Oral Daily  . potassium chloride SA  40 mEq Oral BID  . rifampin (RIFADIN) IVPB  300 mg Intravenous 3 times per day  . sodium chloride  3 mL Intravenous Q12H  . spironolactone  25 mg Oral Daily  . torsemide  40 mg Oral BID   Continuous Infusions: . sodium chloride 10 mL/hr at 04/18/15 1847  . milrinone 0.375 mcg/kg/min (04/21/15 0606)    Principal Problem:   Bacteremia associated with intravascular line Active Problems:   CAD (coronary artery disease)   DM type 2, uncontrolled, with renal complications   Acute on chronic systolic and diastolic heart failure, NYHA class 4   CKD (chronic kidney disease),  stage III   Diabetic ketoacidosis without coma associated with type 2 diabetes mellitus   Uncontrolled type 2 DM with hyperosmolar nonketotic hyperglycemia   Bacteremia    Time spent: 20 min    Jeralyn BennettZAMORA, Garet Hooton  [facility] Pager [phone]. If 7PM-7AM, please contact night-coverage at [email or web address], password [email or web address] 04/21/2015, 5:35 PM  LOS: 3 days

## 2015-04-21 NOTE — Progress Notes (Signed)
ANTIBIOTIC CONSULT NOTE - FOLLOW UP  Pharmacy Consult for ancef Indication: bacteremia  Allergies  Allergen Reactions  . Sulfa Antibiotics Anaphylaxis and Hives  . Sulfamethoxazole Anaphylaxis and Hives    unknown  . Zocor [Simvastatin] Other (See Comments)    Swelling, trouble breathing.  . Lisinopril Rash    Other reaction(s): Angioedema (ALLERGY/intolerance)    Patient Measurements: Height: 6\' 3"  (190.5 cm) Weight: 264 lb 3.2 oz (119.84 kg) (b scale) IBW/kg (Calculated) : 84.5  Vital Signs: Temp: 97.9 F (36.6 C) (07/09 0619) Temp Source: Oral (07/09 0619) BP: 99/53 mmHg (07/09 0619) Pulse Rate: 108 (07/09 1017) Intake/Output from previous day: 07/08 0701 - 07/09 0700 In: 1537.5 [P.O.:1200; I.V.:337.5] Out: 3250 [Urine:3250] Intake/Output from this shift: Total I/O In: 240 [P.O.:240] Out: -   Labs:  Recent Labs  04/18/15 1600  04/19/15 1225 04/20/15 0436 04/21/15 0315  WBC 14.5*  --   --  14.8* 14.0*  HGB 13.4  --   --  12.5* 11.6*  PLT 289  --   --  277 265  CREATININE 1.53*  < > 1.33* 1.34* 1.47*  < > = values in this interval not displayed. Estimated Creatinine Clearance: 75.5 mL/min (by C-G formula based on Cr of 1.47). No results for input(s): VANCOTROUGH, VANCOPEAK, VANCORANDOM, GENTTROUGH, GENTPEAK, GENTRANDOM, TOBRATROUGH, TOBRAPEAK, TOBRARND, AMIKACINPEAK, AMIKACINTROU, AMIKACIN in the last 72 hours.   Microbiology: Recent Results (from the past 720 hour(s))  Culture, blood (routine x 2)     Status: None (Preliminary result)   Collection Time: 04/17/15 11:25 AM  Result Value Ref Range Status   Specimen Description BLOOD RIGHT ARM  Final   Special Requests BOTTLES DRAWN AEROBIC AND ANAEROBIC 10CC  Final   Culture NO GROWTH 3 DAYS  Final   Report Status PENDING  Incomplete  Culture, blood (routine x 2)     Status: None (Preliminary result)   Collection Time: 04/17/15 11:25 AM  Result Value Ref Range Status   Specimen Description BLOOD LEFT ARM   Final   Special Requests BOTTLES DRAWN AEROBIC AND ANAEROBIC 10CC  Final   Culture  Setup Time   Final    GRAM POSITIVE COCCI IN CLUSTERS AEROBIC BOTTLE ONLY CRITICAL RESULT CALLED TO, READ BACK BY AND VERIFIED WITH: M BRADLEY,RN AT 1149 04/18/15 BY L BENFIELD GRAM POSITIVE RODS ANAEROBIC BOTTLE ONLY    Culture STAPHYLOCOCCUS LUGDUNENSIS  Final   Report Status PENDING  Incomplete   Organism ID, Bacteria STAPHYLOCOCCUS LUGDUNENSIS  Final      Susceptibility   Staphylococcus lugdunensis - MIC*    CIPROFLOXACIN <=0.5 SENSITIVE Sensitive     ERYTHROMYCIN <=0.25 SENSITIVE Sensitive     GENTAMICIN <=0.5 SENSITIVE Sensitive     OXACILLIN 0.5 SENSITIVE Sensitive     TETRACYCLINE <=1 SENSITIVE Sensitive     VANCOMYCIN <=0.5 SENSITIVE Sensitive     TRIMETH/SULFA <=10 SENSITIVE Sensitive     CLINDAMYCIN <=0.25 SENSITIVE Sensitive     RIFAMPIN <=0.5 SENSITIVE Sensitive     Inducible Clindamycin NEGATIVE Sensitive     * STAPHYLOCOCCUS LUGDUNENSIS  Culture, blood (routine x 2)     Status: None (Preliminary result)   Collection Time: 04/19/15  5:30 PM  Result Value Ref Range Status   Specimen Description BLOOD LEFT ARM  Final   Special Requests BOTTLES DRAWN AEROBIC AND ANAEROBIC 5CC EACH  Final   Culture NO GROWTH < 24 HOURS  Final   Report Status PENDING  Incomplete  Culture, blood (routine x 2)  Status: None (Preliminary result)   Collection Time: 04/19/15  5:40 PM  Result Value Ref Range Status   Specimen Description BLOOD LEFT ARM  Final   Special Requests BOTTLES DRAWN AEROBIC AND ANAEROBIC 5CC EACH  Final   Culture NO GROWTH < 24 HOURS  Final   Report Status PENDING  Incomplete    Anti-infectives    Start     Dose/Rate Route Frequency Ordered Stop   04/21/15 1400  rifampin (RIFADIN) 300 mg in sodium chloride 0.9 % 100 mL IVPB     300 mg 200 mL/hr over 30 Minutes Intravenous 3 times per day 04/21/15 1038     04/19/15 1600  vancomycin (VANCOCIN) 1,500 mg in sodium chloride 0.9 %  500 mL IVPB  Status:  Discontinued     1,500 mg 250 mL/hr over 120 Minutes Intravenous Every 24 hours 04/18/15 1903 04/21/15 1032   04/18/15 1500  vancomycin (VANCOCIN) 2,000 mg in sodium chloride 0.9 % 500 mL IVPB     2,000 mg 250 mL/hr over 120 Minutes Intravenous  Once 04/18/15 1451 04/18/15 1738      Assessment: 59 yo male with Staph lugdunensis bacteremia on vancomycin and to change to ancef (organism sensitive to oxacillin).  WBC= 14, afebrile, SCr= 1.47 (trend up) and CrCl ~ 30.  ID following.   7/6 vanc>> 7/9 7/9 ancef>>  7/6 BCx x 2 (outpatient) > - STAPHYLOCOCCUS LUGDUNENSIS - 7/7 BC- ngtd  Plan:  -Ancef 2gm IV q8h -Will follow renal function, cultures and clinical progress  Harland German, Pharm D 04/21/2015 10:46 AM

## 2015-04-22 DIAGNOSIS — E1101 Type 2 diabetes mellitus with hyperosmolarity with coma: Secondary | ICD-10-CM

## 2015-04-22 DIAGNOSIS — M10071 Idiopathic gout, right ankle and foot: Secondary | ICD-10-CM

## 2015-04-22 DIAGNOSIS — M109 Gout, unspecified: Secondary | ICD-10-CM | POA: Insufficient documentation

## 2015-04-22 LAB — BASIC METABOLIC PANEL
ANION GAP: 14 (ref 5–15)
BUN: 21 mg/dL — AB (ref 6–20)
CHLORIDE: 97 mmol/L — AB (ref 101–111)
CO2: 22 mmol/L (ref 22–32)
Calcium: 9 mg/dL (ref 8.9–10.3)
Creatinine, Ser: 1.59 mg/dL — ABNORMAL HIGH (ref 0.61–1.24)
GFR, EST AFRICAN AMERICAN: 53 mL/min — AB (ref >60)
GFR, EST NON AFRICAN AMERICAN: 46 mL/min — AB (ref >60)
Glucose, Bld: 222 mg/dL — ABNORMAL HIGH (ref 65–99)
Potassium: 3.9 mmol/L (ref 3.5–5.1)
Sodium: 133 mmol/L — ABNORMAL LOW (ref 135–145)

## 2015-04-22 LAB — CBC
HCT: 37.2 % — ABNORMAL LOW (ref 39.0–52.0)
Hemoglobin: 12.4 g/dL — ABNORMAL LOW (ref 13.0–17.0)
MCH: 25.9 pg — ABNORMAL LOW (ref 26.0–34.0)
MCHC: 33.3 g/dL (ref 30.0–36.0)
MCV: 77.7 fL — ABNORMAL LOW (ref 78.0–100.0)
Platelets: 280 10*3/uL (ref 150–400)
RBC: 4.79 MIL/uL (ref 4.22–5.81)
RDW: 14.1 % (ref 11.5–15.5)
WBC: 15.1 10*3/uL — ABNORMAL HIGH (ref 4.0–10.5)

## 2015-04-22 LAB — CULTURE, BLOOD (ROUTINE X 2): Culture: NO GROWTH

## 2015-04-22 LAB — GLUCOSE, CAPILLARY
GLUCOSE-CAPILLARY: 249 mg/dL — AB (ref 65–99)
GLUCOSE-CAPILLARY: 179 mg/dL — AB (ref 65–99)
Glucose-Capillary: 238 mg/dL — ABNORMAL HIGH (ref 65–99)
Glucose-Capillary: 258 mg/dL — ABNORMAL HIGH (ref 65–99)

## 2015-04-22 MED ORDER — SODIUM CHLORIDE 0.9 % IV SOLN: INTRAVENOUS | Status: DC

## 2015-04-22 MED ORDER — COLCHICINE 0.6 MG PO TABS
0.6000 mg | ORAL_TABLET | Freq: Every day | ORAL | Status: DC
  Administered 2015-04-22 – 2015-04-24 (×3): 0.6 mg via ORAL
  Filled 2015-04-22 (×3): qty 1

## 2015-04-22 MED ORDER — INSULIN GLARGINE 100 UNIT/ML ~~LOC~~ SOLN
60.0000 [IU] | Freq: Every day | SUBCUTANEOUS | Status: DC
Start: 2015-04-22 — End: 2015-04-24
  Administered 2015-04-22 – 2015-04-23 (×2): 60 [IU] via SUBCUTANEOUS
  Filled 2015-04-22 (×3): qty 0.6

## 2015-04-22 NOTE — Progress Notes (Addendum)
Subjective:    Admitted with bacteremia likely associated with PICC. PICC removed 7/6 with PIV started. Blood cultures Staph lugdunensis.  This is a form of coagulase negative staph, but is a greater endocarditis risk than other forms of CNS.    ID consulted.   Glucose uncontrolled- [facility] consulted.   Refuses advanced therapy work up.   Denies SOB. Does feel feet tingling and thinks he may be getting gout flair.    Objective:   Weight Range:  Vital Signs:   Temp:  [97.9 F (36.6 C)-98.1 F (36.7 C)] 97.9 F (36.6 C) (07/09 2012) Pulse Rate:  [107-119] 119 (07/09 2012) Resp:  [18-20] 18 (07/09 2012) BP: (91-115)/(62-83) 91/62 mmHg (07/09 2012) SpO2:  [97 %-98 %] 98 % (07/09 2012) Weight:  [262 lb 12.6 oz (119.2 kg)] 262 lb 12.6 oz (119.2 kg) (07/10 1009) Last BM Date: 04/20/15  Weight change: Filed Weights   04/20/15 0610 04/21/15 0619 04/22/15 1009  Weight: 265 lb 1.6 oz (120.249 kg) 264 lb 3.2 oz (119.84 kg) 262 lb 12.6 oz (119.2 kg)    Intake/Output:   Intake/Output Summary (Last 24 hours) at 04/22/15 1136 Last data filed at 04/22/15 1004  Gross per 24 hour  Intake 1572.8 ml  Output   2475 ml  Net -902.2 ml     Physical Exam: General:  Well appearing. No resp difficulty. In bed.  HEENT: normal Neck: supple. JVP 5-6  . Carotids 2+ bilat; no bruits. No lymphadenopathy or thryomegaly appreciated. Cor: PMI nondisplaced. Regular rate & rhythm. No rubs, or murmurs. + S3  Lungs: clear Abdomen: soft, nontender, nondistended. No hepatosplenomegaly. No bruits or masses. Good bowel sounds. Extremities: no cyanosis, clubbing, rash, edema Neuro: alert & orientedx3, cranial nerves grossly intact. moves all 4 extremities w/o difficulty. Affect pleasant  Telemetry: Sinus Tach 100s   Labs: Basic Metabolic Panel:  Recent Labs Lab 04/18/15 1600  04/19/15 0741 04/19/15 1225 04/20/15 0436 04/21/15 0315 04/22/15 0335  NA 129*  < > 138 135 135 135 133*  K 4.3  <  > 3.7 3.9 4.0 4.2 3.9  CL 91*  < > 102 100* 99* 98* 97*  CO2 24  < > 26 24 25 26 22   GLUCOSE 597*  < > 128* 256* 241* 260* 222*  BUN 23*  < > 23* 24* 20 21* 21*  CREATININE 1.53*  < > 1.24 1.33* 1.34* 1.47* 1.59*  CALCIUM 9.2  < > 9.4 9.4 9.2 9.0 9.0  MG 2.1  --   --   --   --   --   --   < > = values in this interval not displayed.  Liver Function Tests:  Recent Labs Lab 04/18/15 1600  AST 19  ALT 16*  ALKPHOS 130*  BILITOT 0.7  PROT 7.9  ALBUMIN 3.2*   No results for input(s): LIPASE, AMYLASE in the last 168 hours. No results for input(s): AMMONIA in the last 168 hours.  CBC:  Recent Labs Lab 04/18/15 1600 04/20/15 0436 04/21/15 0315 04/22/15 0335  WBC 14.5* 14.8* 14.0* 15.1*  NEUTROABS 11.8*  --   --   --   HGB 13.4 12.5* 11.6* 12.4*  HCT 39.6 37.9* 35.0* 37.2*  MCV 77.0* 77.2* 76.9* 77.7*  PLT 289 277 265 280    Cardiac Enzymes: No results for input(s): CKTOTAL, CKMB, CKMBINDEX, TROPONINI in the last 168 hours.  BNP: BNP (last 3 results)  Recent Labs  12/31/14 1430  BNP 386.4*  ProBNP (last 3 results) No results for input(s): PROBNP in the last 8760 hours.    Other results:  Imaging: No results found.   Medications:     Scheduled Medications: . apixaban  5 mg Oral BID  . atorvastatin  40 mg Oral Daily  . carvedilol  3.125 mg Oral BID WC  .  ceFAZolin (ANCEF) IV  2 g Intravenous Q8H  . digoxin  0.125 mg Oral Daily  . docusate sodium  200 mg Oral Daily  . hydrALAZINE  25 mg Oral 3 times per day  . insulin aspart  0-20 Units Subcutaneous TID WC  . insulin aspart  0-5 Units Subcutaneous QHS  . insulin aspart  12 Units Subcutaneous TID WC  . insulin glargine  55 Units Subcutaneous QHS  . isosorbide mononitrate  30 mg Oral Daily  . magnesium oxide  200 mg Oral BID  . pantoprazole  40 mg Oral Daily  . potassium chloride SA  40 mEq Oral BID  . rifampin (RIFADIN) IVPB  300 mg Intravenous 3 times per day  . sodium chloride  3 mL  Intravenous Q12H  . spironolactone  25 mg Oral Daily  . torsemide  40 mg Oral BID    Infusions: . sodium chloride 10 mL/hr at 04/18/15 1847  . milrinone 0.375 mcg/kg/min (04/22/15 1004)    PRN Medications: sodium chloride, acetaminophen, dextrose, ondansetron (ZOFRAN) IV, oxyCODONE-acetaminophen, sodium chloride   Assessment/Plan    Mr Zagal is a 59 year old with chronic heart failure on home milrinone at 0.375 mcg, admitted due to gram + cocci noted blood cultures 04/17/15.   1. Bacteremia:  PICC infection Staph lugdunensis.Marland Kitchen  PICC removed 7/6 . PIV for home milrinone and antibiotics. Has [employer] Jude ICD. TEE  Monday. ID appreciated. Afebrile. Ancef/ rifiampin. Pharmacy dosing. Has been [facility] for home milrinone. Will need another PICC when ID oks.  2. 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 Most recent ECHO 12/2014 with EF 15%. On home milrinone 0.375 mcg.  No dyspnea, volume status looks ok.  - Coreg 3.125mg  BID started 7/8 tolerated  - Continue dig 0.125 mg daily--> Dig level 0.4.    - Volume status stable today. Continue torsemide 40 mg bid with 40 meq potassium . Creat mildly elevated. 1.34>>1.47>>1.59. Monitor. Around baseline - He is not on an Ace due to angioedema.  - Wants to hold off on LVAD evaluation for now, wants to think about it.   3. A flutter: S/P AFL ablation 01/05/2015. On Eliquis 5 mg twice a day.    4. CKD: Creatinine 1.34>>1.47>> 1.59  mildly increased, monitor.   5. CAD: H/O PCI. No chest pain. On eliquis so no aspirin.  Continue statin.  6. Gout: on colchicine. Assistance [facility].   7. DMII: Uncontrolled Diabetes. Admitted with hyperglycemic nonketotic state.  Glucose > 600.  Hgb A1C 12.2  [facility] consulted. Increased lantus per [facility]  8. ST Jude ICD  TEE Monday.    Anne Fu, Vu Liebman 04/22/2015 11:36 AM

## 2015-04-22 NOTE — Progress Notes (Signed)
[facility] PROGRESS NOTE  Daniel OlpChristopher T Drumgoole ZOX:[id number] DOB: 1956/03/07 DOA: 04/18/2015 PCP: PROVIDER NOT IN SYSTEM  Assessment/Plan: 1. 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 BMP revealed anion gap less than 12 with a bicarbonate of 25 -Underlying Infectious process likely increasing insulin requirement.  -Initially placed on IV insulin with blood sugars trending down. This morning he was transitioned to his home dose of Lantus at 45 units subcutaneous daily with discontinuation of IV insulin -Blood sugars maintaining in the 200's range -Lantus to 60 units [place] q daily given persisntly elevated blood suagrs, continue mealtime coverage to 12 units  2. Gout -Patient having pain involving his right ankle with passive and active movement, reporting having a history of gout that usually presents in this manner.  -Started colchicine 0.6 mg po q daily  3. Probable Infected line -Patient having a PICC line previously placed for administration of IV milrinone, having positive blood cultures -Infectious disease was consulted, PICC line was removed as patient was started on IV vancomycin. -Blood cultures drawn on 04/17/2015 growing Staph lugdunensis -Blood cultures repeated on 04/19/2015 showing no growth to date.  -ID following. AB's changed to Rifampin 300 mg TID and Ancef 2 g TID -Plan for TEE  4.  Ischemic cardiomyopathy -Patient with history of ischemic cardiomyopathy, with transthoracic echocardiogram performed on 01/05/2015 showing ejection fraction of 15% with diffuse hypokinesis. Status post [name] placement. -Repeat transthoracic echocardiogram performed on 04/19/2015 showing poor image quality, however no obvious vegetation seen. -cardiology managing  Code Status: Full code Family Communication:  Disposition Plan:    HPI/Subjective: Patient is a  59 year old with a past medical history of ischemic cardiac myopathy have an ejection fraction 15%, status post ICD placement, on chronic IV milrinone therapy, status post PICC line placement, having blood cultures drawn that were drawn on 04/17/2015 that grew gram-positive cocci in clusters. Patient was started on IV vancomycin with removal of PICC line. Medicine consulted for management of hyperglycemia and DM. He was started on IV insulin with blood sugars trending down.   Objective: Filed Vitals:   04/22/15 1500  BP: 101/59  Pulse: 118  Temp: 98 F (36.7 C)  Resp: 20    Intake/Output Summary (Last 24 hours) at 04/22/15 1700 Last data filed at 04/22/15 1523  Gross per 24 hour  Intake 1452.8 ml  Output   2800 ml  Net -1347.2 ml   Filed Weights   04/20/15 0610 04/21/15 0619 04/22/15 1009  Weight: 120.249 kg (265 lb 1.6 oz) 119.84 kg (264 lb 3.2 oz) 119.2 kg (262 lb 12.6 oz)    Exam:   General:  Patient is awake alert, and related down the hallway, states doing well, he is nontoxic appearing  Cardiovascular:  regular rate rhythm, normal S1-S2, no edema   Respiratory: Normal respiratory effort, minimal bibasilar crackles  Abdomen: Soft nontender nondistended  Musculoskeletal: patient reporting significant pain with passive movement of right foot over ankle, there is swelling and localized warmth over the ankle  Data Reviewed: Basic Metabolic Panel:  Recent Labs Lab 04/18/15 1600  04/19/15 0741 04/19/15 1225 04/20/15 0436 04/21/15 0315 04/22/15 0335  NA 129*  < > 138 135 135 135 133*  K 4.3  < > 3.7 3.9 4.0 4.2 3.9  CL 91*  < > 102 100* 99* 98* 97*  CO2 24  < > 26 24 25 26 22   GLUCOSE 597*  < >  128* 256* 241* 260* 222*  BUN 23*  < > 23* 24* 20 21* 21*  CREATININE 1.53*  < > 1.24 1.33* 1.34* 1.47* 1.59*  CALCIUM 9.2  < > 9.4 9.4 9.2 9.0 9.0  MG 2.1  --   --   --   --   --   --   < > = values in this interval not displayed. Liver Function Tests:  Recent  Labs Lab 04/18/15 1600  AST 19  ALT 16*  ALKPHOS 130*  BILITOT 0.7  PROT 7.9  ALBUMIN 3.2*   No results for input(s): LIPASE, AMYLASE in the last 168 hours. No results for input(s): AMMONIA in the last 168 hours. CBC:  Recent Labs Lab 04/18/15 1600 04/20/15 0436 04/21/15 0315 04/22/15 0335  WBC 14.5* 14.8* 14.0* 15.1*  NEUTROABS 11.8*  --   --   --   HGB 13.4 12.5* 11.6* 12.4*  HCT 39.6 37.9* 35.0* 37.2*  MCV 77.0* 77.2* 76.9* 77.7*  PLT 289 277 265 280   Cardiac Enzymes: No results for input(s): CKTOTAL, CKMB, CKMBINDEX, TROPONINI in the last 168 hours. BNP (last 3 results)  Recent Labs  12/31/14 1430  BNP 386.4*    ProBNP (last 3 results) No results for input(s): PROBNP in the last 8760 hours.  CBG:  Recent Labs Lab 04/21/15 1604 04/21/15 2113 04/22/15 0649 04/22/15 1128 04/22/15 1622  GLUCAP 223* 194* 238* 249* 258*    Recent Results (from the past 240 hour(s))  Culture, blood (routine x 2)     Status: None   Collection Time: 04/17/15 11:25 AM  Result Value Ref Range Status   Specimen Description BLOOD RIGHT ARM  Final   Special Requests BOTTLES DRAWN AEROBIC AND ANAEROBIC 10CC  Final   Culture NO GROWTH 5 DAYS  Final   Report Status 04/22/2015 FINAL  Final  Culture, blood (routine x 2)     Status: None   Collection Time: 04/17/15 11:25 AM  Result Value Ref Range Status   Specimen Description BLOOD LEFT ARM  Final   Special Requests BOTTLES DRAWN AEROBIC AND ANAEROBIC 10CC  Final   Culture  Setup Time   Final    GRAM POSITIVE COCCI IN CLUSTERS AEROBIC BOTTLE ONLY CRITICAL RESULT CALLED TO, READ BACK BY AND VERIFIED WITH: M BRADLEY,RN AT 1149 04/18/15 BY L BENFIELD GRAM POSITIVE RODS ANAEROBIC BOTTLE ONLY    Culture   Final    STAPHYLOCOCCUS LUGDUNENSIS STAPHYLOCOCCUS SPECIES (COAGULASE NEGATIVE) THE SIGNIFICANCE OF ISOLATING THIS ORGANISM FROM A SINGLE SET OF BLOOD CULTURES WHEN MULTIPLE SETS ARE DRAWN IS UNCERTAIN. PLEASE NOTIFY THE  MICROBIOLOGY DEPARTMENT WITHIN ONE WEEK IF SPECIATION AND SENSITIVITIES ARE REQUIRED.    Report Status 04/22/2015 FINAL  Final   Organism ID, Bacteria STAPHYLOCOCCUS LUGDUNENSIS  Final      Susceptibility   Staphylococcus lugdunensis - MIC*    CIPROFLOXACIN <=0.5 SENSITIVE Sensitive     ERYTHROMYCIN <=0.25 SENSITIVE Sensitive     GENTAMICIN <=0.5 SENSITIVE Sensitive     OXACILLIN 0.5 SENSITIVE Sensitive     TETRACYCLINE <=1 SENSITIVE Sensitive     VANCOMYCIN <=0.5 SENSITIVE Sensitive     TRIMETH/SULFA <=10 SENSITIVE Sensitive     CLINDAMYCIN <=0.25 SENSITIVE Sensitive     RIFAMPIN <=0.5 SENSITIVE Sensitive     Inducible Clindamycin NEGATIVE Sensitive     * STAPHYLOCOCCUS LUGDUNENSIS  Culture, blood (routine x 2)     Status: None (Preliminary result)   Collection Time: 04/19/15  5:30 PM  Result Value  Ref Range Status   Specimen Description BLOOD LEFT ARM  Final   Special Requests BOTTLES DRAWN AEROBIC AND ANAEROBIC 5CC EACH  Final   Culture NO GROWTH 3 DAYS  Final   Report Status PENDING  Incomplete  Culture, blood (routine x 2)     Status: None (Preliminary result)   Collection Time: 04/19/15  5:40 PM  Result Value Ref Range Status   Specimen Description BLOOD LEFT ARM  Final   Special Requests BOTTLES DRAWN AEROBIC AND ANAEROBIC 5CC EACH  Final   Culture NO GROWTH 3 DAYS  Final   Report Status PENDING  Incomplete     Studies: No results found.  Scheduled Meds: . apixaban  5 mg Oral BID  . atorvastatin  40 mg Oral Daily  . carvedilol  3.125 mg Oral BID WC  .  ceFAZolin (ANCEF) IV  2 g Intravenous Q8H  . colchicine  0.6 mg Oral Daily  . digoxin  0.125 mg Oral Daily  . docusate sodium  200 mg Oral Daily  . hydrALAZINE  25 mg Oral 3 times per day  . insulin aspart  0-20 Units Subcutaneous TID WC  . insulin aspart  0-5 Units Subcutaneous QHS  . insulin aspart  12 Units Subcutaneous TID WC  . insulin glargine  60 Units Subcutaneous QHS  . isosorbide mononitrate  30 mg Oral  Daily  . magnesium oxide  200 mg Oral BID  . pantoprazole  40 mg Oral Daily  . potassium chloride SA  40 mEq Oral BID  . rifampin (RIFADIN) IVPB  300 mg Intravenous 3 times per day  . sodium chloride  3 mL Intravenous Q12H  . spironolactone  25 mg Oral Daily  . torsemide  40 mg Oral BID   Continuous Infusions: . sodium chloride 10 mL/hr at 04/18/15 1847  . milrinone 0.375 mcg/kg/min (04/22/15 1004)    Principal Problem:   Bacteremia associated with intravascular line Active Problems:   CAD (coronary artery disease)   DM type 2, uncontrolled, with renal complications   Acute on chronic systolic and diastolic heart failure, NYHA class 4   CKD (chronic kidney disease), stage III   Diabetic ketoacidosis without coma associated with type 2 diabetes mellitus   Uncontrolled type 2 DM with hyperosmolar nonketotic hyperglycemia   Bacteremia    Time spent: 20 min    Jeralyn Bennett  [facility] Pager [phone]. If 7PM-7AM, please contact night-coverage at [email or web address], password [facility] 04/22/2015, 5:00 PM  LOS: 4 days

## 2015-04-22 NOTE — Progress Notes (Signed)
[facility]    Date of Admission:  04/18/2015   Total days of antibiotics 5        Day 2 cefazolin/rfi           ID: Daniel Blankenship is a 59 y.o. male with ICD, CKD3, on milrinone for chronic heart failure found to have staph lugdunensis bacteremia  Principal Problem:   Bacteremia associated with intravascular line Active Problems:   CAD (coronary artery disease)   DM type 2, uncontrolled, with renal complications   Acute on chronic systolic and diastolic heart failure, NYHA class 4   CKD (chronic kidney disease), stage III   Diabetic ketoacidosis without coma associated with type 2 diabetes mellitus   Uncontrolled type 2 DM with hyperosmolar nonketotic hyperglycemia   Bacteremia    Subjective: Feeling poorly due to gouty flare to right elbow and right great toe /ankle, which is usually how he presents. He states that his right arm is causing him some discomfort. Denies any shortness of breath, or orthopnea. He also mentioned that his PIV infiltrated  Medications:  . apixaban  5 mg Oral BID  . atorvastatin  40 mg Oral Daily  . carvedilol  3.125 mg Oral BID WC  .  ceFAZolin (ANCEF) IV  2 g Intravenous Q8H  . colchicine  0.6 mg Oral Daily  . digoxin  0.125 mg Oral Daily  . docusate sodium  200 mg Oral Daily  . hydrALAZINE  25 mg Oral 3 times per day  . insulin aspart  0-20 Units Subcutaneous TID WC  . insulin aspart  0-5 Units Subcutaneous QHS  . insulin aspart  12 Units Subcutaneous TID WC  . insulin glargine  60 Units Subcutaneous QHS  . isosorbide mononitrate  30 mg Oral Daily  . magnesium oxide  200 mg Oral BID  . pantoprazole  40 mg Oral Daily  . potassium chloride SA  40 mEq Oral BID  . rifampin (RIFADIN) IVPB  300 mg Intravenous 3 times per day  . sodium chloride  3 mL Intravenous Q12H  . spironolactone  25 mg Oral Daily  . torsemide  40 mg Oral BID    Objective: Vital signs in last 24 hours: Temp:  [97.9 F (36.6 C)] 97.9 F  (36.6 C) (07/09 2012) Pulse Rate:  [119] 119 (07/09 2012) Resp:  [18] 18 (07/09 2012) BP: (91)/(62) 91/62 mmHg (07/09 2012) SpO2:  [98 %] 98 % (07/09 2012) Weight:  [262 lb 12.6 oz (119.2 kg)] 262 lb 12.6 oz (119.2 kg) (07/10 1009) Physical Exam  Constitutional: He is oriented to person, place, and time. He appears well-developed and well-nourished. No distress.  HENT:  Mouth/Throat: Oropharynx is clear and moist. No oropharyngeal exudate.  Cardiovascular: distant heart sounds. Normal rate, regular rhythm and normal heart sounds. Exam reveals no gallop and no friction rub.  No murmur heard.  Pulmonary/Chest: Effort normal and breath sounds normal. No respiratory distress. He has no wheezes.  Abdominal: Soft. Bowel sounds are normal. He exhibits no distension. There is no tenderness.  Lymphadenopathy:  He has no cervical adenopathy.  Ext: right elbow warm to touch, not necessarily swollen, he does have a red tender nodule distal to his elbow, ? Area of infiltrate vs. Bursitis. Tender to right great toe and dorsum of foot Psychiatric: He has a normal mood and affect. His behavior is normal.     Lab Results  Recent Labs  04/21/15 0315 04/22/15 0335  WBC 14.0* 15.1*  HGB 11.6* 12.4*  HCT 35.0* 37.2*  NA 135 133*  K 4.2 3.9  CL 98* 97*  CO2 26 22  BUN 21* 21*  CREATININE 1.47* 1.59*   Liver Panel No results for input(s): PROT, ALBUMIN, AST, ALT, ALKPHOS, BILITOT, BILIDIR, IBILI in the last 72 hours.  Microbiology: 7/5 s. lugdunensis in 1 of 2 sets = oxa s, rif s, gent s, vanco s Studies/Results: No results found.   Assessment/Plan: Staph lugdunensis bacteremia = await for TEE to be done tomorrow to decide on abtx recs. This species is known to be as aggressive as staph aureus. He is currently on  cefazolin plus rifampin.  TEE will also  help us determine if need to add gentamicin and overall course of therapy. Repeat blood cx on 7/7 NGTD at 48hrs thus far.  Gout = started  on colchicine by primary team.   Ckd= will renally dose cefazolin. Cefazolin over nafcillin due to decrease volume and minimizing chf  Chronic heart failure = defer to cardiology team for management.   Drue SecondSNIDER, [facility] Cell: [phone] Pager: [phone]  04/22/2015, 2:14 PM

## 2015-04-23 ENCOUNTER — Encounter (HOSPITAL_COMMUNITY): Payer: Self-pay | Admitting: *Deleted

## 2015-04-23 ENCOUNTER — Inpatient Hospital Stay (HOSPITAL_COMMUNITY): Payer: Non-veteran care

## 2015-04-23 ENCOUNTER — Inpatient Hospital Stay (HOSPITAL_COMMUNITY): Payer: Medicare Other

## 2015-04-23 ENCOUNTER — Encounter (HOSPITAL_COMMUNITY)
Admission: AD | Disposition: A | Discharge status: Home Health | Payer: Self-pay | Source: Ambulatory Visit | Attending: Cardiology

## 2015-04-23 DIAGNOSIS — I38 Endocarditis, valve unspecified: Secondary | ICD-10-CM

## 2015-04-23 DIAGNOSIS — M10061 Idiopathic gout, right knee: Secondary | ICD-10-CM

## 2015-04-23 DIAGNOSIS — M1000 Idiopathic gout, unspecified site: Secondary | ICD-10-CM

## 2015-04-23 HISTORY — PX: TEE WITHOUT CARDIOVERSION: SHX5443

## 2015-04-23 LAB — GLUCOSE, CAPILLARY
GLUCOSE-CAPILLARY: 230 mg/dL — AB (ref 65–99)
Glucose-Capillary: >600 mg/dL (ref 65–99)
Glucose-Capillary: 227 mg/dL — ABNORMAL HIGH (ref 65–99)
Glucose-Capillary: 257 mg/dL — ABNORMAL HIGH (ref 65–99)
Glucose-Capillary: 412 mg/dL — ABNORMAL HIGH (ref 65–99)

## 2015-04-23 LAB — BASIC METABOLIC PANEL
Anion gap: 12 (ref 5–15)
BUN: 24 mg/dL — AB (ref 6–20)
CO2: 25 mmol/L (ref 22–32)
Calcium: 9.1 mg/dL (ref 8.9–10.3)
Chloride: 98 mmol/L — ABNORMAL LOW (ref 101–111)
Creatinine, Ser: 1.63 mg/dL — ABNORMAL HIGH (ref 0.61–1.24)
GFR calc Af Amer: 52 mL/min — ABNORMAL LOW (ref >60)
GFR calc non Af Amer: 45 mL/min — ABNORMAL LOW (ref >60)
Glucose, Bld: 260 mg/dL — ABNORMAL HIGH (ref 65–99)
Potassium: 4 mmol/L (ref 3.5–5.1)
Sodium: 135 mmol/L (ref 135–145)

## 2015-04-23 LAB — CBC
HCT: 34.8 % — ABNORMAL LOW (ref 39.0–52.0)
Hemoglobin: 11.7 g/dL — ABNORMAL LOW (ref 13.0–17.0)
MCH: 25.8 pg — ABNORMAL LOW (ref 26.0–34.0)
MCHC: 33.6 g/dL (ref 30.0–36.0)
MCV: 76.7 fL — AB (ref 78.0–100.0)
PLATELETS: 281 10*3/uL (ref 150–400)
RBC: 4.54 MIL/uL (ref 4.22–5.81)
RDW: 13.8 % (ref 11.5–15.5)
WBC: 13.5 10*3/uL — AB (ref 4.0–10.5)

## 2015-04-23 SURGERY — ECHOCARDIOGRAM, TRANSESOPHAGEAL
Anesthesia: Moderate Sedation

## 2015-04-23 MED ORDER — FENTANYL CITRATE (PF) 100 MCG/2ML IJ SOLN
INTRAMUSCULAR | Status: AC
  Filled 2015-04-23: qty 2

## 2015-04-23 MED ORDER — TORSEMIDE 20 MG PO TABS
40.0000 mg | ORAL_TABLET | Freq: Every day | ORAL | Status: DC
  Administered 2015-04-24: 40 mg via ORAL
  Filled 2015-04-23: qty 2

## 2015-04-23 MED ORDER — MIDAZOLAM HCL 5 MG/ML IJ SOLN
INTRAMUSCULAR | Status: AC
  Filled 2015-04-23: qty 2

## 2015-04-23 MED ORDER — MIDAZOLAM HCL 10 MG/2ML IJ SOLN
INTRAMUSCULAR | Status: DC | PRN
  Administered 2015-04-23 (×4): 2 mg via INTRAVENOUS

## 2015-04-23 MED ORDER — SODIUM CHLORIDE 0.9 % IV SOLN
INTRAVENOUS | Status: DC
Start: 2015-04-23 — End: 2015-04-24

## 2015-04-23 MED ORDER — FENTANYL CITRATE (PF) 100 MCG/2ML IJ SOLN
INTRAMUSCULAR | Status: DC | PRN
  Administered 2015-04-23 (×3): 25 ug via INTRAVENOUS

## 2015-04-23 MED ORDER — BUTAMBEN-TETRACAINE-BENZOCAINE 2-2-14 % EX AERO
INHALATION_SPRAY | CUTANEOUS | Status: DC | PRN
  Administered 2015-04-23: 2 via TOPICAL

## 2015-04-23 MED ORDER — SODIUM CHLORIDE 0.9 % IJ SOLN
10.0000 mL | INTRAMUSCULAR | Status: DC | PRN
  Administered 2015-04-24 (×3): 10 mL
  Filled 2015-04-23 (×3): qty 40

## 2015-04-23 NOTE — Progress Notes (Signed)
Pt declines walking. Sts he doesn't feel like walking since he is going for his test. Also sts his knee hurts. Will f/u tomorrow. Ethelda ChickKristan Jerric Oyen CES, ACSM 10:43 AM 04/23/2015

## 2015-04-23 NOTE — Interval H&P Note (Signed)
History and Physical Interval Note:  04/23/2015 1:11 PM  Daniel Blankenship  has presented today for surgery, with the diagnosis of R/O SBE  The various methods of treatment have been discussed with the patient and family. After consideration of risks, benefits and other options for treatment, the patient has consented to  Procedure(s): TRANSESOPHAGEAL ECHOCARDIOGRAM (TEE) (N/A) as a surgical intervention .  The patient's history has been reviewed, patient examined, no change in status, stable for surgery.  I have reviewed the patient's chart and labs.  Questions were answered to the patient's satisfaction.     Stefanny Pieri [employer]

## 2015-04-23 NOTE — Progress Notes (Addendum)
[facility] PROGRESS NOTE  Daniel Blankenship:[id number] DOB: 1956/02/03 DOA: 04/18/2015 PCP: PROVIDER NOT IN SYSTEM  Assessment/Plan: 1. 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 BMP revealed anion gap less than 12 with a bicarbonate of 25 -Underlying Infectious process likely increasing insulin requirement.  -Initially placed on IV insulin with blood sugars trending down. This morning he was transitioned to his home dose of Lantus at 45 units subcutaneous daily with discontinuation of IV insulin -Blood sugars maintaining in the 200's range -Lantus to 60 units [place] q daily given persistently elevated blood suagrs, continue mealtime coverage to 12 units  2. Gout -Right ankle pain improved, now having right knee pain associated with pain and swelling.  -Continue colchicine 0.6 mg po q daily  3. Probable Infected line -Patient having a PICC line previously placed for administration of IV milrinone, having positive blood cultures -Infectious disease was consulted, PICC line was removed as patient was started on IV vancomycin. -Blood cultures drawn on 04/17/2015 growing Staph lugdunensis -Blood cultures repeated on 04/19/2015 showing no growth to date.  -ID following. AB's changed to Rifampin 300 mg TID and Ancef 2 g TID -TEE did not reveal vegetations -PICC lineplacement  4.  Ischemic cardiomyopathy -Patient with history of ischemic cardiomyopathy, with transthoracic echocardiogram performed on 01/05/2015 showing ejection fraction of 15% with diffuse hypokinesis. Status post [name] placement. -Repeat transthoracic echocardiogram performed on 04/19/2015 showing poor image quality, however no obvious vegetation seen. -cardiology managing  Code Status: Full code Family Communication:  Disposition Plan:    HPI/Subjective: Patient is a 59 year old with a  past medical history of ischemic cardiac myopathy have an ejection fraction 15%, status post ICD placement, on chronic IV milrinone therapy, status post PICC line placement, having blood cultures drawn that were drawn on 04/17/2015 that grew gram-positive cocci in clusters. Patient was started on IV vancomycin with removal of PICC line. Medicine consulted for management of hyperglycemia and DM. He was started on IV insulin with blood sugars trending down.   Objective: Filed Vitals:   04/23/15 1500  BP: 136/99  Pulse: 112  Temp: 98 F (36.7 C)  Resp: 22    Intake/Output Summary (Last 24 hours) at 04/23/15 1752 Last data filed at 04/23/15 1600  Gross per 24 hour  Intake   1360 ml  Output   2550 ml  Net  -1190 ml   Filed Weights   04/21/15 0619 04/22/15 1009 04/23/15 0653  Weight: 119.84 kg (264 lb 3.2 oz) 119.2 kg (262 lb 12.6 oz) 118.207 kg (260 lb 9.6 oz)    Exam:   General:  Patient is awake alert, and related down the hallway, states doing well, he is nontoxic appearing  Cardiovascular:  regular rate rhythm, normal S1-S2, no edema   Respiratory: Normal respiratory effort, minimal bibasilar crackles  Abdomen: Soft nontender nondistended  Musculoskeletal: patient reporting improvement to right ankle pain, however there is pain and swelling over his right knee, no erythema.   Data Reviewed: Basic Metabolic Panel:  Recent Labs Lab 04/18/15 1600  04/19/15 1225 04/20/15 0436 04/21/15 0315 04/22/15 0335 04/23/15 0311  NA 129*  < > 135 135 135 133* 135  K 4.3  < > 3.9 4.0 4.2 3.9 4.0  CL 91*  < > 100* 99* 98* 97* 98*  CO2 24  < > 24 25 26 22 25   GLUCOSE 597*  < > 256* 241* 260*  222* 260*  BUN 23*  < > 24* 20 21* 21* 24*  CREATININE 1.53*  < > 1.33* 1.34* 1.47* 1.59* 1.63*  CALCIUM 9.2  < > 9.4 9.2 9.0 9.0 9.1  MG 2.1  --   --   --   --   --   --   < > = values in this interval not displayed. Liver Function Tests:  Recent Labs Lab 04/18/15 1600  AST 19  ALT  16*  ALKPHOS 130*  BILITOT 0.7  PROT 7.9  ALBUMIN 3.2*   No results for input(s): LIPASE, AMYLASE in the last 168 hours. No results for input(s): AMMONIA in the last 168 hours. CBC:  Recent Labs Lab 04/18/15 1600 04/20/15 0436 04/21/15 0315 04/22/15 0335 04/23/15 0311  WBC 14.5* 14.8* 14.0* 15.1* 13.5*  NEUTROABS 11.8*  --   --   --   --   HGB 13.4 12.5* 11.6* 12.4* 11.7*  HCT 39.6 37.9* 35.0* 37.2* 34.8*  MCV 77.0* 77.2* 76.9* 77.7* 76.7*  PLT 289 277 265 280 281   Cardiac Enzymes: No results for input(s): CKTOTAL, CKMB, CKMBINDEX, TROPONINI in the last 168 hours. BNP (last 3 results)  Recent Labs  12/31/14 1430  BNP 386.4*    ProBNP (last 3 results) No results for input(s): PROBNP in the last 8760 hours.  CBG:  Recent Labs Lab 04/22/15 1622 04/22/15 2140 04/23/15 0647 04/23/15 1450 04/23/15 1642  GLUCAP 258* 179* 227* 257* 412*    Recent Results (from the past 240 hour(s))  Culture, blood (routine x 2)     Status: None   Collection Time: 04/17/15 11:25 AM  Result Value Ref Range Status   Specimen Description BLOOD RIGHT ARM  Final   Special Requests BOTTLES DRAWN AEROBIC AND ANAEROBIC 10CC  Final   Culture NO GROWTH 5 DAYS  Final   Report Status 04/22/2015 FINAL  Final  Culture, blood (routine x 2)     Status: None   Collection Time: 04/17/15 11:25 AM  Result Value Ref Range Status   Specimen Description BLOOD LEFT ARM  Final   Special Requests BOTTLES DRAWN AEROBIC AND ANAEROBIC 10CC  Final   Culture  Setup Time   Final    GRAM POSITIVE COCCI IN CLUSTERS AEROBIC BOTTLE ONLY CRITICAL RESULT CALLED TO, READ BACK BY AND VERIFIED WITH: M BRADLEY,RN AT 1149 04/18/15 BY L BENFIELD GRAM POSITIVE RODS ANAEROBIC BOTTLE ONLY    Culture   Final    STAPHYLOCOCCUS LUGDUNENSIS STAPHYLOCOCCUS SPECIES (COAGULASE NEGATIVE) THE SIGNIFICANCE OF ISOLATING THIS ORGANISM FROM A SINGLE SET OF BLOOD CULTURES WHEN MULTIPLE SETS ARE DRAWN IS UNCERTAIN. PLEASE NOTIFY THE  MICROBIOLOGY DEPARTMENT WITHIN ONE WEEK IF SPECIATION AND SENSITIVITIES ARE REQUIRED.    Report Status 04/22/2015 FINAL  Final   Organism ID, Bacteria STAPHYLOCOCCUS LUGDUNENSIS  Final      Susceptibility   Staphylococcus lugdunensis - MIC*    CIPROFLOXACIN <=0.5 SENSITIVE Sensitive     ERYTHROMYCIN <=0.25 SENSITIVE Sensitive     GENTAMICIN <=0.5 SENSITIVE Sensitive     OXACILLIN 0.5 SENSITIVE Sensitive     TETRACYCLINE <=1 SENSITIVE Sensitive     VANCOMYCIN <=0.5 SENSITIVE Sensitive     TRIMETH/SULFA <=10 SENSITIVE Sensitive     CLINDAMYCIN <=0.25 SENSITIVE Sensitive     RIFAMPIN <=0.5 SENSITIVE Sensitive     Inducible Clindamycin NEGATIVE Sensitive     * STAPHYLOCOCCUS LUGDUNENSIS  Culture, blood (routine x 2)     Status: None (Preliminary result)   Collection Time:  04/19/15  5:30 PM  Result Value Ref Range Status   Specimen Description BLOOD LEFT ARM  Final   Special Requests BOTTLES DRAWN AEROBIC AND ANAEROBIC 5CC EACH  Final   Culture NO GROWTH 4 DAYS  Final   Report Status PENDING  Incomplete  Culture, blood (routine x 2)     Status: None (Preliminary result)   Collection Time: 04/19/15  5:40 PM  Result Value Ref Range Status   Specimen Description BLOOD LEFT ARM  Final   Special Requests BOTTLES DRAWN AEROBIC AND ANAEROBIC 5CC EACH  Final   Culture NO GROWTH 4 DAYS  Final   Report Status PENDING  Incomplete     Studies: No results found.  Scheduled Meds: . apixaban  5 mg Oral BID  . atorvastatin  40 mg Oral Daily  . carvedilol  3.125 mg Oral BID WC  .  ceFAZolin (ANCEF) IV  2 g Intravenous Q8H  . colchicine  0.6 mg Oral Daily  . digoxin  0.125 mg Oral Daily  . docusate sodium  200 mg Oral Daily  . hydrALAZINE  25 mg Oral 3 times per day  . insulin aspart  0-20 Units Subcutaneous TID WC  . insulin aspart  0-5 Units Subcutaneous QHS  . insulin aspart  12 Units Subcutaneous TID WC  . insulin glargine  60 Units Subcutaneous QHS  . isosorbide mononitrate  30 mg Oral  Daily  . magnesium oxide  200 mg Oral BID  . pantoprazole  40 mg Oral Daily  . potassium chloride SA  40 mEq Oral BID  . sodium chloride  3 mL Intravenous Q12H  . spironolactone  25 mg Oral Daily  . [START ON 04/24/2015] torsemide  40 mg Oral Daily   Continuous Infusions: . sodium chloride 10 mL/hr at 04/18/15 1847  . sodium chloride    . sodium chloride    . milrinone 0.375 mcg/kg/min (04/23/15 1439)    Principal Problem:   Bacteremia associated with intravascular line Active Problems:   CAD (coronary artery disease)   DM type 2, uncontrolled, with renal complications   Acute on chronic systolic and diastolic heart failure, NYHA class 4   CKD (chronic kidney disease), stage III   Diabetic ketoacidosis without coma associated with type 2 diabetes mellitus   Uncontrolled type 2 DM with hyperosmolar nonketotic hyperglycemia   Bacteremia   Acute gout    Time spent: 15 min    Daniel Blankenship, Daniel Blankenship  [facility] Pager [phone]. If 7PM-7AM, please contact night-coverage at [email or web address], password [email or web address] 04/23/2015, 5:52 PM  LOS: 5 days

## 2015-04-23 NOTE — Progress Notes (Signed)
Patient's Milrinone IV gtt site markedly infiltrated with swelling, tenderness and blanching present.  PIV removed, warm compress -placed on site.

## 2015-04-23 NOTE — CV Procedure (Signed)
Procedure: TEE  Indication: Assess for endocarditis  Sedation: Versed 8 mg IV, Fentanyl 75 mcg IV  Findings:  Please see echo section for full report.  Moderately dilated LV with severe systolic dysfunction, EF 15%.  Normal RV size with moderate systolic dysfunction.  Mild to moderate LAE.  Normal RA size.  There was mild MR, no MV vegetation.  Trileaflet aortic valve with no AS or AI, no vegetation.  Trivial TR, no vegetation.  PV poorly visualized, no definite vegetation.   ICD leads visualized, no vegetation noted.  No PFO or ASD noted.    No evidence for endocarditis.    Place PICC today, antibiotics plans per ID, potential discharge tomorrow.   Daniel AnconaDalton Rashard Blankenship 04/23/2015 1:32 PM

## 2015-04-23 NOTE — Progress Notes (Signed)
Inpatient Diabetes Program Recommendations  AACE/ADA: New Consensus Statement on Inpatient Glycemic Control (2013)  Target Ranges:  Prepandial:   less than 140 mg/dL      Peak postprandial:   less than 180 mg/dL (1-2 hours)      Critically ill patients:  140 - 180 mg/dL  Inpatient Diabetes Program Recommendations Correction (SSI): xxxxxxxxxxxxxx Insulin - Meal Coverage: When diet is reordered, please change from Heart to carb mod/heart-   Thank you Lenor CoffinAnn Ivory Maduro, RN, MSN, CDE  Diabetes Inpatient Program Office: [phone] Pager: [phone] 8:00 am to 5:00 pm

## 2015-04-23 NOTE — Progress Notes (Signed)
  Echocardiogram  Echocardiogram Transesophageal has been performed.  Leta JunglingCooper, Dorthy Magnussen M 04/23/2015, 1:41 PM

## 2015-04-23 NOTE — Care Management (Signed)
Important Message  Patient Details  Name: Daniel Blankenship MRN: [id number] Date of Birth: [birth year]   Medicare Important Message Given:  Yes-second notification given    Kyla BalzarineShealy, Duff Pozzi Abena 04/23/2015, 2:45 PM

## 2015-04-23 NOTE — H&P (View-Only) (Signed)
Patient ID: Daniel Blankenship, male   DOB: 08-20-1956, [age] y.o.   MRN: [id number] Advanced Heart Failure Rounding Note   Subjective:    Admitted with bacteremia likely associated with PICC. PICC removed 7/6 with PIV started. Staph lugdunensis on blood cultures. ID consulted. Blood cultures repeated 7/7, so far negative.  He is now on cefazolin + rifampin.   Glucose uncontrolled- [facility] consulted.   Gout flare in right foot, better on colchicine.    Creatinine inching up.   Objective:   Weight Range:  Vital Signs:   Temp:  [97.9 F (36.6 C)-98.5 F (36.9 C)] 97.9 F (36.6 C) (07/11 0653) Pulse Rate:  [110-118] 110 (07/11 0653) Resp:  [18-20] 18 (07/11 0653) BP: (93-101)/(53-60) 95/60 mmHg (07/11 0653) SpO2:  [95 %-96 %] 96 % (07/11 0653) Weight:  [260 lb 9.6 oz (118.207 kg)-262 lb 12.6 oz (119.2 kg)] 260 lb 9.6 oz (118.207 kg) (07/11 0653) Last BM Date: 04/22/15  Weight change: Filed Weights   04/21/15 0619 04/22/15 1009 04/23/15 0653  Weight: 264 lb 3.2 oz (119.84 kg) 262 lb 12.6 oz (119.2 kg) 260 lb 9.6 oz (118.207 kg)    Intake/Output:   Intake/Output Summary (Last 24 hours) at 04/23/15 0946 Last data filed at 04/23/15 0700  Gross per 24 hour  Intake   1240 ml  Output   3000 ml  Net  -1760 ml     Physical Exam: General:  Well appearing. No resp difficulty. In bed.  HEENT: normal Neck: supple. JVP 5-6  . Carotids 2+ bilat; no bruits. No lymphadenopathy or thryomegaly appreciated. Cor: PMI nondisplaced. Regular rate & rhythm. No rubs, or murmurs. + S3  Lungs: clear Abdomen: soft, nontender, nondistended. No hepatosplenomegaly. No bruits or masses. Good bowel sounds. Extremities: no cyanosis, clubbing, rash, edema Neuro: alert & orientedx3, cranial nerves grossly intact. moves all 4 extremities w/o difficulty. Affect pleasant  Telemetry: Sinus Tachy 100s   Labs: Basic Metabolic Panel:  Recent Labs Lab 04/18/15 1600  04/19/15 1225 04/20/15 0436  04/21/15 0315 04/22/15 0335 04/23/15 0311  NA 129*  < > 135 135 135 133* 135  K 4.3  < > 3.9 4.0 4.2 3.9 4.0  CL 91*  < > 100* 99* 98* 97* 98*  CO2 24  < > 24 25 26 22 25   GLUCOSE 597*  < > 256* 241* 260* 222* 260*  BUN 23*  < > 24* 20 21* 21* 24*  CREATININE 1.53*  < > 1.33* 1.34* 1.47* 1.59* 1.63*  CALCIUM 9.2  < > 9.4 9.2 9.0 9.0 9.1  MG 2.1  --   --   --   --   --   --   < > = values in this interval not displayed.  Liver Function Tests:  Recent Labs Lab 04/18/15 1600  AST 19  ALT 16*  ALKPHOS 130*  BILITOT 0.7  PROT 7.9  ALBUMIN 3.2*   No results for input(s): LIPASE, AMYLASE in the last 168 hours. No results for input(s): AMMONIA in the last 168 hours.  CBC:  Recent Labs Lab 04/18/15 1600 04/20/15 0436 04/21/15 0315 04/22/15 0335 04/23/15 0311  WBC 14.5* 14.8* 14.0* 15.1* 13.5*  NEUTROABS 11.8*  --   --   --   --   HGB 13.4 12.5* 11.6* 12.4* 11.7*  HCT 39.6 37.9* 35.0* 37.2* 34.8*  MCV 77.0* 77.2* 76.9* 77.7* 76.7*  PLT 289 277 265 280 281    Cardiac Enzymes: No results for input(s): CKTOTAL, CKMB,  CKMBINDEX, TROPONINI in the last 168 hours.  BNP: BNP (last 3 results)  Recent Labs  12/31/14 1430  BNP 386.4*    ProBNP (last 3 results) No results for input(s): PROBNP in the last 8760 hours.    Other results:  Imaging: No results found.   Medications:     Scheduled Medications: . apixaban  5 mg Oral BID  . atorvastatin  40 mg Oral Daily  . carvedilol  3.125 mg Oral BID WC  .  ceFAZolin (ANCEF) IV  2 g Intravenous Q8H  . colchicine  0.6 mg Oral Daily  . digoxin  0.125 mg Oral Daily  . docusate sodium  200 mg Oral Daily  . hydrALAZINE  25 mg Oral 3 times per day  . insulin aspart  0-20 Units Subcutaneous TID WC  . insulin aspart  0-5 Units Subcutaneous QHS  . insulin aspart  12 Units Subcutaneous TID WC  . insulin glargine  60 Units Subcutaneous QHS  . isosorbide mononitrate  30 mg Oral Daily  . magnesium oxide  200 mg Oral BID   . pantoprazole  40 mg Oral Daily  . potassium chloride SA  40 mEq Oral BID  . rifampin (RIFADIN) IVPB  300 mg Intravenous 3 times per day  . sodium chloride  3 mL Intravenous Q12H  . spironolactone  25 mg Oral Daily  . [START ON 04/24/2015] torsemide  40 mg Oral Daily    Infusions: . sodium chloride 10 mL/hr at 04/18/15 1847  . sodium chloride    . milrinone 0.375 mcg/kg/min (04/23/15 7829)    PRN Medications: sodium chloride, acetaminophen, dextrose, ondansetron (ZOFRAN) IV, oxyCODONE-acetaminophen, sodium chloride   Assessment/Plan    Daniel Blankenship is a 59 year old with chronic heart failure on home milrinone at 0.375 mcg, admitted due to gram + cocci noted blood cultures 04/17/15, found to have Staph lugdunensis.  1. Bacteremia: Blood Cultures obtained 7/5 with Staph lugdunensis.  Suspect PICC infection.  PICC removed 7/6 . PIV for home milrinone and antibiotics. Has St Jude ICD.  ID appreciated.  Blood cultures repeated 7/7. Afebrile. Now on cefazolin + rifampin Has been followed by [facility] for home milrinone. Will need another PICC when ID oks. - Greater endocarditis risk with S lugdunensis than other coag-negative staph, will need TEE for duration of antibiotics (to be done today).  2. 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 Most recent ECHO 12/2014 with EF 15%. On home milrinone 0.375 mcg.  No dyspnea, volume status looks ok.  - Continue dig 0.125 mg daily--> Dig level 0.4.    - Volume status stable today. With creatinine inching up, will decrease torsemide to 40 daily.  Probably home on 40 qam/20 qpm.  - He is not on an ACEI due to angioedema.  - He has tolerated low dose Coreg.   - Wants to hold off on LVAD evaluation for now, wants to think about it. Barriers would be social support and poorly controlled DM.  3. A flutter: S/P AFL ablation 01/05/2015. On Eliquis 5 mg twice a day.   4. CKD: Mild increase, decrease torsemide to once  daily.   5. CAD: H/O PCI. No chest pain. On eliquis so no aspirin.  Continue statin. 6. Gout: on colchicine, ankle feeling better.  7. DMII: Uncontrolled Diabetes. Admitted with hyperglycemic nonketotic state.  Glucose > 600.  Hgb A1C 12.2  [facility] consulted and managing.  8. St Jude ICD  Length  of Stay: 5  Marca Ancona  04/23/2015, 9:46 AM  Advanced Heart Failure Team Pager [phone] (M-F; 7a - 4p)  Please contact [facility] for night-coverage after hours (4p -7a ) and weekends on [email or web address]

## 2015-04-23 NOTE — Progress Notes (Addendum)
Peripherally Inserted Central Catheter/Midline Placement  The IV Nurse has discussed with the patient and/or persons authorized to consent for the patient, the purpose of this procedure and the potential benefits and risks involved with this procedure.  The benefits include less needle sticks, lab draws from the catheter and patient may be discharged home with the catheter.  Risks include, but not limited to, infection, bleeding, blood clot (thrombus formation), and puncture of an artery; nerve damage and irregular heat beat.  Alternatives to this procedure were also discussed.  Consent obtained by Stacie Glazeobin Joyce, RN  PICC/Midline Placement Documentation        Daniel Blankenship, Daniel Blankenship 04/23/2015, 6:30 PM

## 2015-04-23 NOTE — Progress Notes (Signed)
Patient ID: Daniel Blankenship, male   DOB: [birth year], 59 y.o.   MRN: [id number]         [facility]    Date of Admission:  04/18/2015           Day 5 antibiotics  Principal Problem:   Bacteremia associated with intravascular line Active Problems:   CAD (coronary artery disease)   DM type 2, uncontrolled, with renal complications   Acute on chronic systolic and diastolic heart failure, NYHA class 4   CKD (chronic kidney disease), stage III   Diabetic ketoacidosis without coma associated with type 2 diabetes mellitus   Uncontrolled type 2 DM with hyperosmolar nonketotic hyperglycemia   Bacteremia   Acute gout   . apixaban  5 mg Oral BID  . atorvastatin  40 mg Oral Daily  . carvedilol  3.125 mg Oral BID WC  .  ceFAZolin (ANCEF) IV  2 g Intravenous Q8H  . colchicine  0.6 mg Oral Daily  . digoxin  0.125 mg Oral Daily  . docusate sodium  200 mg Oral Daily  . hydrALAZINE  25 mg Oral 3 times per day  . insulin aspart  0-20 Units Subcutaneous TID WC  . insulin aspart  0-5 Units Subcutaneous QHS  . insulin aspart  12 Units Subcutaneous TID WC  . insulin glargine  60 Units Subcutaneous QHS  . isosorbide mononitrate  30 mg Oral Daily  . magnesium oxide  200 mg Oral BID  . pantoprazole  40 mg Oral Daily  . potassium chloride SA  40 mEq Oral BID  . rifampin (RIFADIN) IVPB  300 mg Intravenous 3 times per day  . sodium chloride  3 mL Intravenous Q12H  . spironolactone  25 mg Oral Daily  . [START ON 04/24/2015] torsemide  40 mg Oral Daily    Past Medical History  Diagnosis Date  . Atrial flutter with rapid ventricular response   . Vascular dementia   . CHF (congestive heart failure)   . Delirium   . Ischemic cardiomyopathy   . Septic shock 12/2014  . CAD (coronary artery disease)   . Anemia   . Hypertension   . Hyperlipidemia   . Gout of hand   . [name] (automatic cardioverter/defibrillator) present   . Myocardial infarction 1979?  Marland Kitchen Pneumonia    "maybe 3 times" (04/18/2015)  . IDDM (insulin dependent diabetes mellitus)   . History of blood transfusion 1982    "related to crushed leg/knee"  . Arthritis     "hands, knees, ankles" (04/18/2015)  . Anxiety   . Panic attacks   . Acute renal failure   . 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"    History  Substance Use Topics  . Smoking status: Former Smoker -- 1.50 packs/day for 14 years    Types: Cigarettes  . Smokeless tobacco: Never Used     Comment: 04/18/2015 "stopped smoking in ~ 2000"  . Alcohol Use: Yes     Comment: 04/18/2015 "stopped drinking in the 1980's    Family History  Problem Relation Age of Onset  . Family history unknown: Yes   Allergies  Allergen Reactions  . Sulfa Antibiotics Anaphylaxis and Hives  . Sulfamethoxazole Anaphylaxis and Hives    unknown  . Zocor [Simvastatin] Other (See Comments)    Swelling, trouble breathing.  . Lisinopril Rash    Other reaction(s): Angioedema (ALLERGY/intolerance)    OBJECTIVE: Blood pressure 131/110, pulse 107, temperature 97.9  F (36.6 C), temperature source Oral, resp. rate 22, height  (1.905 m), weight 260 lb 9.6 oz (118.207 kg), SpO2 96 %. General: He is currently out of his room for TEE  Lab Results Lab Results  Component Value Date   WBC 13.5* 04/23/2015   HGB 11.7* 04/23/2015   HCT 34.8* 04/23/2015   MCV 76.7* 04/23/2015   PLT 281 04/23/2015    Lab Results  Component Value Date   CREATININE 1.63* 04/23/2015   BUN 24* 04/23/2015   NA 135 04/23/2015   K 4.0 04/23/2015   CL 98* 04/23/2015   CO2 25 04/23/2015    Lab Results  Component Value Date   ALT 16* 04/18/2015   AST 19 04/18/2015   ALKPHOS 130* 04/18/2015   BILITOT 0.7 04/18/2015     Microbiology: Recent Results (from the past 240 hour(s))  Culture, blood (routine x 2)     Status: None   Collection Time: 04/17/15 11:25 AM  Result Value Ref Range Status   Specimen Description BLOOD RIGHT ARM  Final   Special  Requests BOTTLES DRAWN AEROBIC AND ANAEROBIC 10CC  Final   Culture NO GROWTH 5 DAYS  Final   Report Status 04/22/2015 FINAL  Final  Culture, blood (routine x 2)     Status: None   Collection Time: 04/17/15 11:25 AM  Result Value Ref Range Status   Specimen Description BLOOD LEFT ARM  Final   Special Requests BOTTLES DRAWN AEROBIC AND ANAEROBIC 10CC  Final   Culture  Setup Time   Final    GRAM POSITIVE COCCI IN CLUSTERS AEROBIC BOTTLE ONLY CRITICAL RESULT CALLED TO, READ BACK BY AND VERIFIED WITH: M BRADLEY,RN AT 1149 04/18/15 BY L BENFIELD GRAM POSITIVE RODS ANAEROBIC BOTTLE ONLY    Culture   Final    STAPHYLOCOCCUS LUGDUNENSIS STAPHYLOCOCCUS SPECIES (COAGULASE NEGATIVE) THE SIGNIFICANCE OF ISOLATING THIS ORGANISM FROM A SINGLE SET OF BLOOD CULTURES WHEN MULTIPLE SETS ARE DRAWN IS UNCERTAIN. PLEASE NOTIFY THE MICROBIOLOGY DEPARTMENT WITHIN ONE WEEK IF SPECIATION AND SENSITIVITIES ARE REQUIRED.    Report Status 04/22/2015 FINAL  Final   Organism ID, Bacteria STAPHYLOCOCCUS LUGDUNENSIS  Final      Susceptibility   Staphylococcus lugdunensis - MIC*    CIPROFLOXACIN <=0.5 SENSITIVE Sensitive     ERYTHROMYCIN <=0.25 SENSITIVE Sensitive     GENTAMICIN <=0.5 SENSITIVE Sensitive     OXACILLIN 0.5 SENSITIVE Sensitive     TETRACYCLINE <=1 SENSITIVE Sensitive     VANCOMYCIN <=0.5 SENSITIVE Sensitive     TRIMETH/SULFA <=10 SENSITIVE Sensitive     CLINDAMYCIN <=0.25 SENSITIVE Sensitive     RIFAMPIN <=0.5 SENSITIVE Sensitive     Inducible Clindamycin NEGATIVE Sensitive     * STAPHYLOCOCCUS LUGDUNENSIS  Culture, blood (routine x 2)     Status: None (Preliminary result)   Collection Time: 04/19/15  5:30 PM  Result Value Ref Range Status   Specimen Description BLOOD LEFT ARM  Final   Special Requests BOTTLES DRAWN AEROBIC AND ANAEROBIC 5CC EACH  Final   Culture NO GROWTH 3 DAYS  Final   Report Status PENDING  Incomplete  Culture, blood (routine x 2)     Status: None (Preliminary result)    Collection Time: 04/19/15  5:40 PM  Result Value Ref Range Status   Specimen Description BLOOD LEFT ARM  Final   Special Requests BOTTLES DRAWN AEROBIC AND ANAEROBIC 5CC EACH  Final   Culture NO GROWTH 3 DAYS  Final   Report Status PENDING  Incomplete    Assessment: He has one positive blood culture growing Staph lugdunensis, a coagulase-negative staph species. Repeat blood cultures on 04/19/2015 are negative so a new PICC can be placed at any time. If TEE shows any evidence of endocarditis he will need at least 4 weeks of IV probiotic therapy in consideration of [name] removal. Even if there is no evidence of endocarditis he may still need a long course of antibiotics in the presence of an [name].  Plan: 1. Continue cefazolin and rifampin 2. Await results of TEE  Cliffton AstersJohn Resa Rinks, MD [facility] [phone] pager   [phone] cell 04/23/2015, 12:58 PM

## 2015-04-23 NOTE — Progress Notes (Signed)
Patient ID: Daniel Blankenship, male   DOB: 08-20-1956, [age] y.o.   MRN: [id number] Advanced Heart Failure Rounding Note   Subjective:    Admitted with bacteremia likely associated with PICC. PICC removed 7/6 with PIV started. Staph lugdunensis on blood cultures. ID consulted. Blood cultures repeated 7/7, so far negative.  He is now on cefazolin + rifampin.   Glucose uncontrolled- [facility] consulted.   Gout flare in right foot, better on colchicine.    Creatinine inching up.   Objective:   Weight Range:  Vital Signs:   Temp:  [97.9 F (36.6 C)-98.5 F (36.9 C)] 97.9 F (36.6 C) (07/11 0653) Pulse Rate:  [110-118] 110 (07/11 0653) Resp:  [18-20] 18 (07/11 0653) BP: (93-101)/(53-60) 95/60 mmHg (07/11 0653) SpO2:  [95 %-96 %] 96 % (07/11 0653) Weight:  [260 lb 9.6 oz (118.207 kg)-262 lb 12.6 oz (119.2 kg)] 260 lb 9.6 oz (118.207 kg) (07/11 0653) Last BM Date: 04/22/15  Weight change: Filed Weights   04/21/15 0619 04/22/15 1009 04/23/15 0653  Weight: 264 lb 3.2 oz (119.84 kg) 262 lb 12.6 oz (119.2 kg) 260 lb 9.6 oz (118.207 kg)    Intake/Output:   Intake/Output Summary (Last 24 hours) at 04/23/15 0946 Last data filed at 04/23/15 0700  Gross per 24 hour  Intake   1240 ml  Output   3000 ml  Net  -1760 ml     Physical Exam: General:  Well appearing. No resp difficulty. In bed.  HEENT: normal Neck: supple. JVP 5-6  . Carotids 2+ bilat; no bruits. No lymphadenopathy or thryomegaly appreciated. Cor: PMI nondisplaced. Regular rate & rhythm. No rubs, or murmurs. + S3  Lungs: clear Abdomen: soft, nontender, nondistended. No hepatosplenomegaly. No bruits or masses. Good bowel sounds. Extremities: no cyanosis, clubbing, rash, edema Neuro: alert & orientedx3, cranial nerves grossly intact. moves all 4 extremities w/o difficulty. Affect pleasant  Telemetry: Sinus Tachy 100s   Labs: Basic Metabolic Panel:  Recent Labs Lab 04/18/15 1600  04/19/15 1225 04/20/15 0436  04/21/15 0315 04/22/15 0335 04/23/15 0311  NA 129*  < > 135 135 135 133* 135  K 4.3  < > 3.9 4.0 4.2 3.9 4.0  CL 91*  < > 100* 99* 98* 97* 98*  CO2 24  < > 24 25 26 22 25   GLUCOSE 597*  < > 256* 241* 260* 222* 260*  BUN 23*  < > 24* 20 21* 21* 24*  CREATININE 1.53*  < > 1.33* 1.34* 1.47* 1.59* 1.63*  CALCIUM 9.2  < > 9.4 9.2 9.0 9.0 9.1  MG 2.1  --   --   --   --   --   --   < > = values in this interval not displayed.  Liver Function Tests:  Recent Labs Lab 04/18/15 1600  AST 19  ALT 16*  ALKPHOS 130*  BILITOT 0.7  PROT 7.9  ALBUMIN 3.2*   No results for input(s): LIPASE, AMYLASE in the last 168 hours. No results for input(s): AMMONIA in the last 168 hours.  CBC:  Recent Labs Lab 04/18/15 1600 04/20/15 0436 04/21/15 0315 04/22/15 0335 04/23/15 0311  WBC 14.5* 14.8* 14.0* 15.1* 13.5*  NEUTROABS 11.8*  --   --   --   --   HGB 13.4 12.5* 11.6* 12.4* 11.7*  HCT 39.6 37.9* 35.0* 37.2* 34.8*  MCV 77.0* 77.2* 76.9* 77.7* 76.7*  PLT 289 277 265 280 281    Cardiac Enzymes: No results for input(s): CKTOTAL, CKMB,  CKMBINDEX, TROPONINI in the last 168 hours.  BNP: BNP (last 3 results)  Recent Labs  12/31/14 1430  BNP 386.4*    ProBNP (last 3 results) No results for input(s): PROBNP in the last 8760 hours.    Other results:  Imaging: No results found.   Medications:     Scheduled Medications: . apixaban  5 mg Oral BID  . atorvastatin  40 mg Oral Daily  . carvedilol  3.125 mg Oral BID WC  .  ceFAZolin (ANCEF) IV  2 g Intravenous Q8H  . colchicine  0.6 mg Oral Daily  . digoxin  0.125 mg Oral Daily  . docusate sodium  200 mg Oral Daily  . hydrALAZINE  25 mg Oral 3 times per day  . insulin aspart  0-20 Units Subcutaneous TID WC  . insulin aspart  0-5 Units Subcutaneous QHS  . insulin aspart  12 Units Subcutaneous TID WC  . insulin glargine  60 Units Subcutaneous QHS  . isosorbide mononitrate  30 mg Oral Daily  . magnesium oxide  200 mg Oral BID   . pantoprazole  40 mg Oral Daily  . potassium chloride SA  40 mEq Oral BID  . rifampin (RIFADIN) IVPB  300 mg Intravenous 3 times per day  . sodium chloride  3 mL Intravenous Q12H  . spironolactone  25 mg Oral Daily  . [START ON 04/24/2015] torsemide  40 mg Oral Daily    Infusions: . sodium chloride 10 mL/hr at 04/18/15 1847  . sodium chloride    . milrinone 0.375 mcg/kg/min (04/23/15 7829)    PRN Medications: sodium chloride, acetaminophen, dextrose, ondansetron (ZOFRAN) IV, oxyCODONE-acetaminophen, sodium chloride   Assessment/Plan    Daniel Blankenship is a 59 year old with chronic heart failure on home milrinone at 0.375 mcg, admitted due to gram + cocci noted blood cultures 04/17/15, found to have Staph lugdunensis.  1. Bacteremia: Blood Cultures obtained 7/5 with Staph lugdunensis.  Suspect PICC infection.  PICC removed 7/6 . PIV for home milrinone and antibiotics. Has St Jude ICD.  ID appreciated.  Blood cultures repeated 7/7. Afebrile. Now on cefazolin + rifampin Has been followed by [facility] for home milrinone. Will need another PICC when ID oks. - Greater endocarditis risk with S lugdunensis than other coag-negative staph, will need TEE for duration of antibiotics (to be done today).  2. 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 Most recent ECHO 12/2014 with EF 15%. On home milrinone 0.375 mcg.  No dyspnea, volume status looks ok.  - Continue dig 0.125 mg daily--> Dig level 0.4.    - Volume status stable today. With creatinine inching up, will decrease torsemide to 40 daily.  Probably home on 40 qam/20 qpm.  - He is not on an ACEI due to angioedema.  - He has tolerated low dose Coreg.   - Wants to hold off on LVAD evaluation for now, wants to think about it. Barriers would be social support and poorly controlled DM.  3. A flutter: S/P AFL ablation 01/05/2015. On Eliquis 5 mg twice a day.   4. CKD: Mild increase, decrease torsemide to once  daily.   5. CAD: H/O PCI. No chest pain. On eliquis so no aspirin.  Continue statin. 6. Gout: on colchicine, ankle feeling better.  7. DMII: Uncontrolled Diabetes. Admitted with hyperglycemic nonketotic state.  Glucose > 600.  Hgb A1C 12.2  [facility] consulted and managing.  8. St Jude ICD  Length  of Stay: 5  Marca Ancona  04/23/2015, 9:46 AM  Advanced Heart Failure Team Pager [phone] (M-F; 7a - 4p)  Please contact [facility] for night-coverage after hours (4p -7a ) and weekends on [email or web address]

## 2015-04-24 ENCOUNTER — Encounter (HOSPITAL_COMMUNITY): Payer: Self-pay | Admitting: Cardiology

## 2015-04-24 LAB — GLUCOSE, CAPILLARY
GLUCOSE-CAPILLARY: 200 mg/dL — AB (ref 65–99)
GLUCOSE-CAPILLARY: 150 mg/dL — AB (ref 65–99)
Glucose-Capillary: 119 mg/dL — ABNORMAL HIGH (ref 65–99)

## 2015-04-24 LAB — BASIC METABOLIC PANEL
ANION GAP: 10 (ref 5–15)
BUN: 21 mg/dL — AB (ref 6–20)
CALCIUM: 8.8 mg/dL — AB (ref 8.9–10.3)
CHLORIDE: 102 mmol/L (ref 101–111)
CO2: 23 mmol/L (ref 22–32)
CREATININE: 1.14 mg/dL (ref 0.61–1.24)
GFR calc Af Amer: >60 mL/min (ref >60)
GFR calc non Af Amer: >60 mL/min (ref >60)
GLUCOSE: 211 mg/dL — AB (ref 65–99)
POTASSIUM: 4.2 mmol/L (ref 3.5–5.1)
Sodium: 135 mmol/L (ref 135–145)

## 2015-04-24 LAB — CULTURE, BLOOD (ROUTINE X 2)
Culture: NO GROWTH
Culture: NO GROWTH

## 2015-04-24 LAB — CBC
HCT: 32.1 % — ABNORMAL LOW (ref 39.0–52.0)
Hemoglobin: 10.6 g/dL — ABNORMAL LOW (ref 13.0–17.0)
MCH: 25.4 pg — ABNORMAL LOW (ref 26.0–34.0)
MCHC: 33 g/dL (ref 30.0–36.0)
MCV: 77 fL — ABNORMAL LOW (ref 78.0–100.0)
Platelets: 277 10*3/uL (ref 150–400)
RBC: 4.17 MIL/uL — ABNORMAL LOW (ref 4.22–5.81)
RDW: 14 % (ref 11.5–15.5)
WBC: 10.8 10*3/uL — ABNORMAL HIGH (ref 4.0–10.5)

## 2015-04-24 MED ORDER — POTASSIUM CHLORIDE CRYS ER 20 MEQ PO TBCR
40.0000 meq | EXTENDED_RELEASE_TABLET | Freq: Every day | ORAL | Status: DC
  Administered 2015-04-24: 40 meq via ORAL

## 2015-04-24 MED ORDER — MILRINONE IN DEXTROSE 20 MG/100ML IV SOLN: 0.3750 ug/kg/min | INTRAVENOUS | Status: AC

## 2015-04-24 MED ORDER — APIXABAN 5 MG PO TABS: 5.0000 mg | ORAL_TABLET | Freq: Two times a day (BID) | ORAL | Status: DC

## 2015-04-24 MED ORDER — INSULIN GLARGINE 100 UNIT/ML ~~LOC~~ SOLN: 60.0000 [IU] | Freq: Every day | SUBCUTANEOUS | Status: AC

## 2015-04-24 MED ORDER — TORSEMIDE 20 MG PO TABS: 40.0000 mg | ORAL_TABLET | Freq: Every day | ORAL | Status: AC

## 2015-04-24 MED ORDER — COLCHICINE 0.6 MG PO TABS: 0.6000 mg | ORAL_TABLET | Freq: Every day | ORAL | Status: AC

## 2015-04-24 MED ORDER — ACETAMINOPHEN 325 MG PO TABS: 650.0000 mg | ORAL_TABLET | ORAL | Status: DC | PRN

## 2015-04-24 MED ORDER — INSULIN ASPART 100 UNIT/ML ~~LOC~~ SOLN: 12.0000 [IU] | Freq: Three times a day (TID) | SUBCUTANEOUS | Status: AC

## 2015-04-24 MED ORDER — POTASSIUM CHLORIDE CRYS ER 20 MEQ PO TBCR: 40.0000 meq | EXTENDED_RELEASE_TABLET | Freq: Every day | ORAL | Status: AC

## 2015-04-24 MED ORDER — INSULIN SYRINGES (DISPOSABLE) U-100 1 ML MISC: 12.0000 [IU] | Freq: Three times a day (TID) | Status: AC

## 2015-04-24 MED ORDER — CEFAZOLIN SODIUM-DEXTROSE 2-3 GM-% IV SOLR: 2.0000 g | Freq: Three times a day (TID) | INTRAVENOUS | Status: AC

## 2015-04-24 MED ORDER — CARVEDILOL 3.125 MG PO TABS: 3.1250 mg | ORAL_TABLET | Freq: Two times a day (BID) | ORAL | Status: DC

## 2015-04-24 NOTE — Progress Notes (Signed)
Patient ID: Daniel Blankenship, male   DOB: 07-Feb-1956, 59 y.o.   MRN: [id number] Advanced Heart Failure Rounding Note   Subjective:    Admitted with bacteremia likely associated with PICC. PICC removed 7/6 with PIV started. Staph lugdunensis on blood cultures. ID consulted. Blood cultures repeated 7/7, so far negative.  TEE (7/11) did not show endocarditis or ICD vegetation.  He is now on cefazolin. PICC line was replaced on 7/11.   Glucose uncontrolled- [facility] consulted.   Gout flare in right foot, better on colchicine.  Now right knee hurts.   Objective:   Weight Range:  Vital Signs:   Temp:  [98 F (36.7 C)-98.2 F (36.8 C)] 98 F (36.7 C) (07/12 0515) Pulse Rate:  [98-116] 98 (07/12 0515) Resp:  [11-29] 18 (07/12 0515) BP: (107-157)/(60-125) 121/75 mmHg (07/12 0643) SpO2:  [95 %-100 %] 98 % (07/12 0515) Weight:  [264 lb 3.2 oz (119.84 kg)] 264 lb 3.2 oz (119.84 kg) (07/12 0515) Last BM Date: 04/23/15  Weight change: Filed Weights   04/22/15 1009 04/23/15 0653 04/24/15 0515  Weight: 262 lb 12.6 oz (119.2 kg) 260 lb 9.6 oz (118.207 kg) 264 lb 3.2 oz (119.84 kg)    Intake/Output:   Intake/Output Summary (Last 24 hours) at 04/24/15 0909 Last data filed at 04/24/15 0719  Gross per 24 hour  Intake    940 ml  Output   1250 ml  Net   -310 ml     Physical Exam: General:  Well appearing. No resp difficulty. In bed.  HEENT: normal Neck: supple. JVP 5-6  . Carotids 2+ bilat; no bruits. No lymphadenopathy or thryomegaly appreciated. Cor: PMI nondisplaced. Regular rate & rhythm. No rubs, or murmurs. No S3.   Lungs: clear Abdomen: soft, nontender, nondistended. No hepatosplenomegaly. No bruits or masses. Good bowel sounds. Extremities: no cyanosis, clubbing, rash, edema Neuro: alert & orientedx3, cranial nerves grossly intact. moves all 4 extremities w/o difficulty. Affect pleasant  Telemetry: NSR 90s  Labs: Basic Metabolic Panel:  Recent Labs Lab 04/18/15 1600   04/20/15 0436 04/21/15 0315 04/22/15 0335 04/23/15 0311 04/24/15 0530  NA 129*  < > 135 135 133* 135 135  K 4.3  < > 4.0 4.2 3.9 4.0 4.2  CL 91*  < > 99* 98* 97* 98* 102  CO2 24  < > 25 26 22 25 23   GLUCOSE 597*  < > 241* 260* 222* 260* 211*  BUN 23*  < > 20 21* 21* 24* 21*  CREATININE 1.53*  < > 1.34* 1.47* 1.59* 1.63* 1.14  CALCIUM 9.2  < > 9.2 9.0 9.0 9.1 8.8*  MG 2.1  --   --   --   --   --   --   < > = values in this interval not displayed.  Liver Function Tests:  Recent Labs Lab 04/18/15 1600  AST 19  ALT 16*  ALKPHOS 130*  BILITOT 0.7  PROT 7.9  ALBUMIN 3.2*   No results for input(s): LIPASE, AMYLASE in the last 168 hours. No results for input(s): AMMONIA in the last 168 hours.  CBC:  Recent Labs Lab 04/18/15 1600 04/20/15 0436 04/21/15 0315 04/22/15 0335 04/23/15 0311 04/24/15 0530  WBC 14.5* 14.8* 14.0* 15.1* 13.5* 10.8*  NEUTROABS 11.8*  --   --   --   --   --   HGB 13.4 12.5* 11.6* 12.4* 11.7* 10.6*  HCT 39.6 37.9* 35.0* 37.2* 34.8* 32.1*  MCV 77.0* 77.2* 76.9* 77.7* 76.7* 77.0*  PLT 289 277 265 280 281 277    Cardiac Enzymes: No results for input(s): CKTOTAL, CKMB, CKMBINDEX, TROPONINI in the last 168 hours.  BNP: BNP (last 3 results)  Recent Labs  12/31/14 1430  BNP 386.4*    ProBNP (last 3 results) No results for input(s): PROBNP in the last 8760 hours.    Other results:  Imaging: No results found.   Medications:     Scheduled Medications: . apixaban  5 mg Oral BID  . atorvastatin  40 mg Oral Daily  . carvedilol  3.125 mg Oral BID WC  .  ceFAZolin (ANCEF) IV  2 g Intravenous Q8H  . colchicine  0.6 mg Oral Daily  . digoxin  0.125 mg Oral Daily  . docusate sodium  200 mg Oral Daily  . hydrALAZINE  25 mg Oral 3 times per day  . insulin aspart  0-20 Units Subcutaneous TID WC  . insulin aspart  0-5 Units Subcutaneous QHS  . insulin aspart  12 Units Subcutaneous TID WC  . insulin glargine  60 Units Subcutaneous QHS  .  isosorbide mononitrate  30 mg Oral Daily  . magnesium oxide  200 mg Oral BID  . pantoprazole  40 mg Oral Daily  . potassium chloride SA  40 mEq Oral BID  . sodium chloride  3 mL Intravenous Q12H  . spironolactone  25 mg Oral Daily  . torsemide  40 mg Oral Daily    Infusions: . sodium chloride 10 mL/hr at 04/18/15 1847  . sodium chloride    . sodium chloride    . milrinone 0.375 mcg/kg/min (04/24/15 0422)    PRN Medications: sodium chloride, acetaminophen, ondansetron (ZOFRAN) IV, oxyCODONE-acetaminophen, sodium chloride, sodium chloride   Assessment/Plan    Daniel Blankenship is a 59 year old with chronic heart failure on home milrinone at 0.375 mcg, admitted due to gram + cocci noted blood cultures 04/17/15, found to have Staph lugdunensis.  1. Bacteremia: Blood Cultures obtained 7/5 with Staph lugdunensis.  Suspect PICC infection.  PICC removed 7/6.  Blood cultures repeated 7/7, negative so far. Afebrile. TEE with no endocarditis, rifampin stopped.  Now on cefazolin, PICC replaced on 7/11. Has been followed by [facility] for home milrinone.  - Continue cefazolin, need ID to comment on length of therapy.  Once we know this, can go home today if we can arrange home antibiotic infusions with care management.   2. 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 TEE on 7/11 showed EF 15% with moderate RV systolic dysfunction. On home milrinone 0.375 mcg.  No dyspnea, volume status looks ok.  - Continue dig 0.125 mg daily--> Dig level 0.4.    - Volume status stable today. Continue torsemide 40 mg daily. - He is not on an ACEI due to angioedema. Continue hydralazine/Imdur.  - He has tolerated low dose Coreg.   - Wants to hold off on LVAD evaluation for now, wants to think about it. Barriers would be social support, noncompliance, and poorly controlled DM.  3. A flutter: S/P AFL ablation 01/05/2015. On Eliquis 5 mg twice a day.   4. CKD: Stable today.    5. CAD:  H/O PCI. No chest pain. On eliquis so no aspirin.  Continue statin. 6. Gout: on colchicine, ankle feeling better but has right knee pain.   7. DMII: Uncontrolled Diabetes. Admitted with hyperglycemic nonketotic state.  Glucose > 600.  Hgb A1C 12.2  [facility] Hospitalist consulted and managing.  8. St Jude ICD 9. Disposition: Home today with CHF clinic followup in 7-10 days if we can arrange home antibiotic infusion.  Home meds: cefazolin 2 g q8 hrs (length of treatment to be determined by ID), torsemide 40 mg daily, KCl 40 daily, hydralazine 25 tid, Imdur 30 daily, Coreg 3.125 mg bid, apixaban 5 mg bid, atorvastatin 40 daily, colchicine 0.6 daily, digoxin 0.125 daily, spironolactone 25 daily, milrinone 0.375 infusion.  Will contact hospitalist for ideal diabetes regimen for home.  Compliance with meds encouraged.   Length of Stay: 6  Marca Ancona  04/24/2015, 9:09 AM  Advanced Heart Failure Team Pager [phone] (M-F; 7a - 4p)  Please contact [facility] for night-coverage after hours (4p -7a ) and weekends on [email or web address]

## 2015-04-24 NOTE — Discharge Instructions (Addendum)
Information on my medicine - ELIQUIS (apixaban)  This medication education was reviewed with me or my healthcare representative as part of my discharge preparation.    Why was Eliquis prescribed for you? Eliquis was prescribed for you to reduce the risk of a blood clot forming that can cause a stroke if you have a medical condition called atrial fibrillation (a type of irregular heartbeat).  What do You need to know about Eliquis ? Take your Eliquis TWICE DAILY - one tablet in the morning and one tablet in the evening with or without food. If you have difficulty swallowing the tablet whole please discuss with your pharmacist how to take the medication safely.  Take Eliquis exactly as prescribed by your doctor and DO NOT stop taking Eliquis without talking to the doctor who prescribed the medication.  Stopping may increase your risk of developing a stroke.  Refill your prescription before you run out.  After discharge, you should have regular check-up appointments with your healthcare provider that is prescribing your Eliquis.  In the future your dose may need to be changed if your kidney function or weight changes by a significant amount or as you get older.  What do you do if you miss a dose? If you miss a dose, take it as soon as you remember on the same day and resume taking twice daily.  Do not take more than one dose of ELIQUIS at the same time to make up a missed dose.  Important Safety Information A possible side effect of Eliquis is bleeding. You should call your healthcare provider right away if you experience any of the following: ? Bleeding from an injury or your nose that does not stop. ? Unusual colored urine (red or dark brown) or unusual colored stools (red or black). ? Unusual bruising for unknown reasons. ? A serious fall or if you hit your head (even if there is no bleeding).  Some medicines may interact with Eliquis and might increase your risk of bleeding or  clotting while on Eliquis. To help avoid this, consult your healthcare provider or pharmacist prior to using any new prescription or non-prescription medications, including herbals, vitamins, non-steroidal anti-inflammatory drugs (NSAIDs) and supplements.  This website has more information on Eliquis (apixaban): http://www.eliquis.com/eliquis/home   You will receive IV antibiotic every 8 hours through 05/19/15  Continue the milrinone.  We placed you on colchicine daily for your gout.  We adjusted your meds.   Weigh daily Call [phone] if weight climbs more than 3 pounds in a day or 5 pounds in a week. No salt to very little salt in your diet.  No more than 2000 mg in a day. Call if increased shortness of breath or increased swelling.  Call for fevers or any problems  See your primary MD for diabetes management next week.  Check you glucose before meals and at bedtime and take a log of these results with you to MD.  We increased your lantus and added meal coverage.

## 2015-04-24 NOTE — Progress Notes (Signed)
Patient is followed by [facility] for IV milirone drip; patient is to be on home IV antibiotics as well, Jeri ModenaPam Dashaun Onstott RN with [facility] made aware and will see patient today; Olene FlossB Keenan Dimitrov RN,MHA, BSN [phone]

## 2015-04-24 NOTE — Progress Notes (Signed)
CARDIAC REHAB PHASE I   PRE:  Rate/Rhythm: 110 sT  BP:  Sitting: 127/77        SaO2: 100 RA  MODE:  Ambulation: 300 ft   POST:  Rate/Rhythm: 120 ST  BP:  Sitting: 115/73         SaO2: 97 RA  Pt c/o R knee pain d/t gout flare. Pt states he did not ambulate yesterday, hesitant but agreeable to walk today. Pt ambulated 300 ft on RA, IV, rolling walker, independent, steady gait, tolerated fair. Pt did stop to rest twice due to knee pain, states he has a walker at home. Pt states he is not sure if he is going home today. Pt states he is not sure how he got a blood infection from his PICC line if he is doing "everything I am supposed to do." Once again attempted to discuss heart failure education with pt, pt states he is doing "everything he is supposed to", states he is "eating right and exercising" and has all the information he needs. Pt still declines phase 2 cardiac rehab at this time. Pt states he has no further needs or questions. Pt to bed after walk per pt request, call bell within reach.   [phone]   Joylene GrapesMonge, Ilya Neely C, RN, BSN 04/24/2015 11:59 AM

## 2015-04-24 NOTE — Discharge Summary (Signed)
Physician Discharge Summary       Patient ID: Daniel Blankenship MRN: [id number] DOB/AGE: [birth year] 60 y.o.  Admit date: 04/18/2015 Discharge date: 04/24/2015 Primary Cardiologist: Dr. Aundra Dubin Discharge Diagnoses:  Principal Problem:   Bacteremia associated with intravascular line Active Problems:   CAD (coronary artery disease)   DM type 2, uncontrolled, with renal complications   Acute on chronic systolic and diastolic heart failure, NYHA class 4   CKD (chronic kidney disease), stage III   Diabetic ketoacidosis without coma associated with type 2 diabetes mellitus   Uncontrolled type 2 DM with hyperosmolar nonketotic hyperglycemia   Bacteremia   Acute gout   Discharged Condition: good  Procedures: TEE by Dr. Aundra Dubin 04/23/15  New PICC line placed 04/23/15  I&O -5621 Wt admit 266.14  D/C  264.3 lbs  Hospital Course: [name] is a 59 year old with chronic heart failure on home milrinone at 0.375 mcg, admitted due to gram + cocci noted blood cultures 04/17/15, found to have Staph lugdunensis.  1. Bacteremia: Blood Cultures obtained 7/5 with Staph lugdunensis. Suspect PICC infection. PICC removed 7/6. Blood cultures repeated 7/7, negative so far. Afebrile. TEE with no endocarditis, rifampin stopped. Now on cefazolin, PICC replaced on 7/11. Has been followed by [facility] for home milrinone.  - Continue cefazolin, ID recommends cefazolin for 4 weeks total, this was started 04/21/15 and last dose will be 05/19/15. He can go home today if we can arrange home antibiotic infusions with care management.  2. Chronic Systolic HF: Ischemic CMP +/- component of tachy-mediated CMP from A flutter. TEE on 7/11 showed EF 15% with moderate RV systolic dysfunction. On home milrinone 0.375 mcg. No dyspnea, volume status looks ok.  - Continue dig 0.125 mg daily--> Dig level 0.4.  - Volume status stable today. Continue torsemide 40 mg daily. - He is not on an ACEI due to angioedema. Continue  hydralazine/Imdur.  - He has tolerated low dose Coreg.  - Wants to hold off on LVAD evaluation for now, wants to think about it. Barriers would be social support, noncompliance, and poorly controlled DM.   3. A flutter: S/P AFL ablation 01/05/2015. Maintaining SR to slight ST with 1st degree AV block.  On Eliquis 5 mg twice a day.   4. CKD: Stable today with Cr 1.14.   5. CAD: H/O PCI. No chest pain. On eliquis so no aspirin. Continue statin.  6. Gout: on colchicine, ankle feeling better but has right knee pain.   7. DMII: Uncontrolled Diabetes. Admitted with hyperglycemic nonketotic state. Glucose > 600. Hgb A1C 12.2 [facility] Hospitalist consulted initially on IV insulin now with increased Lantus to 60 units and meal coverage at 12 units. Discussed with Dr. Coralyn Pear and will continue this dose but will need close follow up with PCP.    8. St Jude ICD 9. Disposition: seen and found stable for discharge by Dr. Aundra Dubin.  Home today with CHF clinic followup in 7-10 days - has appt on 05/02/15 we have arranged home antibiotic infusion. Home meds: cefazolin 2 g q8 hrs for 4 weeks total, ending 05/19/15, torsemide 40 mg daily, KCl 40 daily, hydralazine 25 tid, Imdur 30 daily, Coreg 3.125 mg bid, apixaban 5 mg bid, atorvastatin 40 daily, colchicine 0.6 daily, digoxin 0.125 daily, spironolactone 25 daily, milrinone 0.375 infusion. Insulin per IM see above. Compliance with meds encouraged.   Consults: cardiology and IDand IM for his uncontrolled diabetes.  Significant Diagnostic Studies:  BMP Latest Ref Rng 04/24/2015 04/23/2015 04/22/2015  Glucose 65 - 99 mg/dL 211(H) 260(H) 222(H)  BUN 6 - 20 mg/dL 21(H) 24(H) 21(H)  Creatinine 0.61 - 1.24 mg/dL 1.14 1.63(H) 1.59(H)  Sodium 135 - 145 mmol/L 135 135 133(L)  Potassium 3.5 - 5.1 mmol/L 4.2 4.0 3.9  Chloride 101 - 111 mmol/L 102 98(L) 97(L)  CO2 22 - 32 mmol/L 23 25 22   Calcium 8.9 - 10.3 mg/dL 8.8(L) 9.1 9.0   CBC Latest Ref Rng 04/24/2015  04/23/2015 04/22/2015  WBC 4.0 - 10.5 K/uL 10.8(H) 13.5(H) 15.1(H)  Hemoglobin 13.0 - 17.0 g/dL 10.6(L) 11.7(L) 12.4(L)  Hematocrit 39.0 - 52.0 % 32.1(L) 34.8(L) 37.2(L)  Platelets 150 - 400 K/uL 277 281 280    Glucose on admit 597.  Hepatic Function Latest Ref Rng 04/18/2015 01/11/2015 01/07/2015  Total Protein 6.5 - 8.1 g/dL 7.9 6.1 5.2(L)  Albumin 3.5 - 5.0 g/dL 3.2(L) 2.7(L) 2.5(L)  AST 15 - 41 U/L 19 23 54(H)  ALT 17 - 63 U/L 16(L) 46 124(H)  Alk Phosphatase 38 - 126 U/L 130(H) 97 124(H)  Total Bilirubin 0.3 - 1.2 mg/dL 0.7 2.3(H) 2.5(H)  Bilirubin, Direct 0.0 - 0.5 mg/dL - 0.6(H) -   TSH:  1/945  HgBA1C 12.2  EKG: Sinus tachycardia Possible Anterior infarct , age undetermined Abnormal ECG    TTE: Study Conclusions  - Left ventricle: Even with contrast endocardium not well visualized EF appears at least moderately reduced and possibly severe. The cavity size was moderately dilated. Wall thickness was normal. - Left atrium: The atrium was mildly dilated. - Impressions: Poor image quality consider TEE if suspicion for SBE high or need to see pacing wires better. Impressions: - Poor image quality consider TEE if suspicion for SBE high or need to see pacing wires better.  TEE: Findings: Please see echo section for full report. Moderately dilated LV with severe systolic dysfunction, EF 24%. Normal RV size with moderate systolic dysfunction. Mild to moderate LAE. Normal RA size. There was mild MR, no MV vegetation. Trileaflet aortic valve with no AS or AI, no vegetation. Trivial TR, no vegetation. PV poorly visualized, no definite vegetation. ICD leads visualized, no vegetation noted. No PFO or ASD noted.   No evidence for endocarditis.  He has one positive blood culture growing Staph lugdunensis, a coagulase-negative staph species. Repeat blood cultures on 04/19/2015 are negative.    Discharge Exam: Blood pressure 105/73, pulse 108, temperature 97.9  F (36.6 C), temperature source Oral, resp. rate 18, height 6' 3"  (1.905 m), weight 264 lb 3.2 oz (119.84 kg), SpO2 98 %.  Disposition: 06-Home-Health Care Svc     Medication List    TAKE these medications        acetaminophen 325 MG tablet  Commonly known as:  TYLENOL  Take 2 tablets (650 mg total) by mouth every 4 (four) hours as needed for headache or mild pain.     apixaban 5 MG Tabs tablet  Commonly known as:  ELIQUIS  Take 1 tablet (5 mg total) by mouth 2 (two) times daily.     atorvastatin 80 MG tablet  Commonly known as:  LIPITOR  Take 40 mg by mouth daily.     carvedilol 3.125 MG tablet  Commonly known as:  COREG  Take 1 tablet (3.125 mg total) by mouth 2 (two) times daily with a meal.     ceFAZolin 2-3 GM-% Solr  Commonly known as:  ANCEF  Inject 50 mLs (2 g total) into the vein every 8 (eight) hours.  colchicine 0.6 MG tablet  Take 1 tablet (0.6 mg total) by mouth daily.     digoxin 0.125 MG tablet  Commonly known as:  LANOXIN  Take 1 tablet (0.125 mg total) by mouth daily.     docusate sodium 100 MG capsule  Commonly known as:  COLACE  Take 200 mg by mouth daily.     hydrALAZINE 25 MG tablet  Commonly known as:  APRESOLINE  Take 1 tablet (25 mg total) by mouth every 8 (eight) hours.     insulin aspart 100 UNIT/ML injection  Commonly known as:  novoLOG  Inject 12 Units into the skin 3 (three) times daily with meals.     insulin glargine 100 UNIT/ML injection  Commonly known as:  LANTUS  Inject 0.6 mLs (60 Units total) into the skin at bedtime.     Insulin Syringes (Disposable) U-100 1 ML Misc  12 Units by Does not apply route 3 (three) times daily before meals.     isosorbide mononitrate 30 MG 24 hr tablet  Commonly known as:  IMDUR  Take 1 tablet (30 mg total) by mouth daily.     Magnesium Oxide 200 MG Tabs  Take 1 tablet (200 mg total) by mouth 2 (two) times daily.     milrinone 20 MG/100ML Soln infusion  Commonly known as:  PRIMACOR    Inject 45.4125 mcg/min into the vein continuous.     oxyCODONE-acetaminophen 5-325 MG per tablet  Commonly known as:  ROXICET  Take 1 tablet by mouth every 6 (six) hours as needed for pain.     pantoprazole 40 MG tablet  Commonly known as:  PROTONIX  Take 1 tablet (40 mg total) by mouth daily.     potassium chloride SA 20 MEQ tablet  Commonly known as:  K-DUR,KLOR-CON  Take 2 tablets (40 mEq total) by mouth daily.     spironolactone 25 MG tablet  Commonly known as:  ALDACTONE  Take 1 tablet (25 mg total) by mouth daily.     torsemide 20 MG tablet  Commonly known as:  DEMADEX  Take 2 tablets (40 mg total) by mouth daily.       Follow-up Information    Follow up with Loralie Champagne, MD On 05/02/2015.   Specialty:  Cardiology   Why:  at 11:20 am in the Advanced [facility] Failure Clinic--gate code [id number]--Please bring all medications to appt   Contact information:   [address] [place] [address] [phone]       Follow up with [facility].   Why:  They will continue to see you as prior to hospital admission for home health care services   Contact information:   [address] [address] [phone]        Discharge Instructions: Info on eliquis  You will receive IV antibiotic every 8 hours through 05/19/15  Continue the milrinone.  We placed you on colchicine daily for your gout.  We adjusted your meds.   Weigh daily Call [phone] if weight climbs more than 3 pounds in a day or 5 pounds in a week. No salt to very little salt in your diet.  No more than 2000 mg in a day. Call if increased shortness of breath or increased swelling.  Call for fevers or any problems  See your primary MD for diabetes management next week.  Check you glucose before meals and at bedtime and take a log of these  results with you to MD.  We increased your lantus and added meal coverage.        Signed: Isaiah Serge Nurse  Practitioner-Certified [facility]: [facility] 04/24/2015, 3:26 PM  Time spent on discharge : > 35 minutes.

## 2015-04-24 NOTE — Progress Notes (Signed)
Patient ID: Daniel Blankenship, male   DOB: December 17, 1955, 59 y.o.   MRN: [id number]         [facility]    Date of Admission:  04/18/2015           Day 6 antibiotics  Principal Problem:   Bacteremia associated with intravascular line Active Problems:   CAD (coronary artery disease)   DM type 2, uncontrolled, with renal complications   Acute on chronic systolic and diastolic heart failure, NYHA class 4   CKD (chronic kidney disease), stage III   Diabetic ketoacidosis without coma associated with type 2 diabetes mellitus   Uncontrolled type 2 DM with hyperosmolar nonketotic hyperglycemia   Bacteremia   Acute gout   . apixaban  5 mg Oral BID  . atorvastatin  40 mg Oral Daily  . carvedilol  3.125 mg Oral BID WC  .  ceFAZolin (ANCEF) IV  2 g Intravenous Q8H  . colchicine  0.6 mg Oral Daily  . digoxin  0.125 mg Oral Daily  . docusate sodium  200 mg Oral Daily  . hydrALAZINE  25 mg Oral 3 times per day  . insulin aspart  0-20 Units Subcutaneous TID WC  . insulin aspart  0-5 Units Subcutaneous QHS  . insulin aspart  12 Units Subcutaneous TID WC  . insulin glargine  60 Units Subcutaneous QHS  . isosorbide mononitrate  30 mg Oral Daily  . magnesium oxide  200 mg Oral BID  . pantoprazole  40 mg Oral Daily  . potassium chloride SA  40 mEq Oral Daily  . sodium chloride  3 mL Intravenous Q12H  . spironolactone  25 mg Oral Daily  . torsemide  40 mg Oral Daily   OBJECTIVE: Blood pressure 105/73, pulse 108, temperature 97.9 F (36.6 C), temperature source Oral, resp. rate 18, height 6\' 3"  (1.905 m), weight 264 lb 3.2 oz (119.84 kg), SpO2 98 %.   Assessment: His TEE did not reveal any evidence of native valve endocarditis or vegetations on his [name] leads. I do suspect that is one positive blood culture represented true bacteremia. I would recommend continuing cefazolin for a total of 4 weeks in an effort to prevent infection of the [name].  Plan: 1. Continue  cefazolin for 22 more days 2. I will arrange followup in my clinic  Daniel AstersJohn Shenique Childers, MD [facility] [phone] pager   [phone] cell 04/24/2015, 5:29 PM

## 2015-04-25 ENCOUNTER — Encounter: Payer: Self-pay | Admitting: Internal Medicine

## 2015-05-02 ENCOUNTER — Encounter (HOSPITAL_COMMUNITY): Payer: Self-pay | Admitting: Student

## 2015-05-02 ENCOUNTER — Telehealth (HOSPITAL_COMMUNITY): Payer: Self-pay | Admitting: Cardiology

## 2015-05-02 ENCOUNTER — Inpatient Hospital Stay (HOSPITAL_COMMUNITY): Admission: RE | Admit: 2015-05-02 | Payer: Non-veteran care | Source: Ambulatory Visit

## 2015-05-02 NOTE — Progress Notes (Signed)
Patient ID: Daniel Blankenship, male   DOB: 1956/01/17, 59 y.o.   MRN: [id number]   Pt did not show up for appointment today.  [facility] with WBC climbing from 10 to 14.  Patient had recent admission for bacteremia and is supposed to be on Cefazolin via IV infusion.  Will contact [facility] about getting Blood cultures from 2 sites and ensure he is still getting ABX.  If BCs +, will refer to ID.     Casimiro NeedleMichael [address]"Andy" Peakillery, PA-C 05/02/2015 11:59 AM

## 2015-05-02 NOTE — Telephone Encounter (Signed)
Called to clarify orders for cx's As pt missed HFU today HF providers wanted to make sure he gets these done   Selena BattenKim states she spoke with patient and he was under the impression his appointment was 05/03/15 and would have repeat cx's done at that time Selena BattenKim also reports he does not feel well at all  Advised to keep orders the same for right now, can verify with providers first thing in the Am and if he does need to be seen will add patient to the schedule for close follow up

## 2015-05-04 ENCOUNTER — Telehealth (HOSPITAL_COMMUNITY): Payer: Self-pay | Admitting: *Deleted

## 2015-05-04 NOTE — Telephone Encounter (Signed)
[facility] RN called and stated she couldn't get any draw back from pts picc line.  She was able to draw from his hand yesterday.  We ordered blood cultures x2. Amy Filbert Schilder said that the one set of cultures was fine. Gave verbal for nurse to d/c second order of blood cultures per [facility]

## 2015-05-07 ENCOUNTER — Encounter: Payer: Self-pay | Admitting: Internal Medicine

## 2015-05-10 ENCOUNTER — Telehealth (HOSPITAL_COMMUNITY): Payer: Self-pay | Admitting: Cardiology

## 2015-05-10 NOTE — Telephone Encounter (Signed)
Called to get verbal orders to continue HH/nursing services for patient CHF management, milrinone, labs, etc verbal order given to continue services

## 2015-05-18 ENCOUNTER — Telehealth (HOSPITAL_COMMUNITY): Payer: Self-pay | Admitting: Cardiology

## 2015-05-18 DIAGNOSIS — I5022 Chronic systolic (congestive) heart failure: Secondary | ICD-10-CM

## 2015-05-18 MED ORDER — MAGNESIUM OXIDE -MG SUPPLEMENT 200 MG PO TABS: 400.0000 mg | ORAL_TABLET | Freq: Two times a day (BID) | ORAL | Status: DC

## 2015-05-18 NOTE — Telephone Encounter (Signed)
ABNORMAL LABS Labs drawn via [facility] 05/14/2015 Magnesium 1.7 per Daniel Blankenship Patient currently taking Mag OX 200 mg one tab BID Patient should increase Mg Ox  To 400 mg BID  Patient aware and voiced understanding

## 2015-05-23 ENCOUNTER — Encounter: Payer: Self-pay | Admitting: Internal Medicine

## 2015-05-23 ENCOUNTER — Ambulatory Visit (INDEPENDENT_AMBULATORY_CARE_PROVIDER_SITE_OTHER): Payer: Medicare Other | Admitting: Internal Medicine

## 2015-05-23 VITALS — BP 114/78 | HR 121 | Temp 97.9°F | Wt 273.0 lb

## 2015-05-23 DIAGNOSIS — T827XXD Infection and inflammatory reaction due to other cardiac and vascular devices, implants and grafts, subsequent encounter: Secondary | ICD-10-CM | POA: Diagnosis present

## 2015-05-23 DIAGNOSIS — R7881 Bacteremia: Secondary | ICD-10-CM

## 2015-05-23 NOTE — Assessment & Plan Note (Signed)
I am hopeful that his transient staphylococcal bacteremia has now been cured. I will have him stop cefazolin. I will have him follow-up with me in 4 weeks. I asked him to call me right away if he develops fever or other signs of possible recurrent infection before that visit.

## 2015-05-23 NOTE — Progress Notes (Signed)
[facility]  Patient Active Problem List   Diagnosis Date Noted  . Bacteremia associated with intravascular line 04/18/2015    Priority: High  . Acute gout   . Bacteremia   . Uncontrolled type 2 DM with hyperosmolar nonketotic hyperglycemia   . Diabetic ketoacidosis without coma associated with type 2 diabetes mellitus   . Atrial flutter, paroxysmal 03/29/2015  . Chronic systolic heart failure 02/01/2015  . Acute pulmonary edema   . Hypoxemia   . Acute liver failure 01/02/2015  . Abdominal pain   . Obesity (BMI 30-39.9) 01/01/2015  . Essential hypertension 01/01/2015  . Hyperglycemia 12/31/2014  . Acute on chronic systolic and diastolic heart failure, NYHA class 4 12/31/2014  . CKD (chronic kidney disease), stage III 12/31/2014  . CHF (congestive heart failure) 12/31/2014  . Type II or unspecified type diabetes mellitus with renal manifestations, uncontrolled 05/16/2013  . Type II or unspecified type diabetes mellitus with peripheral circulatory disorders, uncontrolled(250.72) 04/25/2013  . CHF (congestive heart failure), NYHA class III 04/21/2013  . Other and unspecified hyperlipidemia 04/21/2013  . CAD (coronary artery disease) 04/21/2013  . Atrial flutter with rapid ventricular response 04/21/2013  . Anxiety state, unspecified 04/21/2013  . Unspecified constipation 04/21/2013  . DM type 2, uncontrolled, with renal complications 04/21/2013    Patient's Medications  New Prescriptions   No medications on file  Previous Medications   ACETAMINOPHEN (TYLENOL) 325 MG TABLET    Take 2 tablets (650 mg total) by mouth every 4 (four) hours as needed for headache or mild pain.   APIXABAN (ELIQUIS) 5 MG TABS TABLET    Take 1 tablet (5 mg total) by mouth 2 (two) times daily.   ATORVASTATIN (LIPITOR) 80 MG TABLET    Take 40 mg by mouth daily.    CARVEDILOL (COREG) 3.125 MG TABLET    Take 1 tablet (3.125 mg total) by mouth 2 (two) times daily with a  meal.   COLCHICINE 0.6 MG TABLET    Take 1 tablet (0.6 mg total) by mouth daily.   DIGOXIN (LANOXIN) 0.125 MG TABLET    Take 1 tablet (0.125 mg total) by mouth daily.   DOCUSATE SODIUM (COLACE) 100 MG CAPSULE    Take 200 mg by mouth daily.   HYDRALAZINE (APRESOLINE) 25 MG TABLET    Take 1 tablet (25 mg total) by mouth every 8 (eight) hours.   INSULIN ASPART (NOVOLOG) 100 UNIT/ML INJECTION    Inject 12 Units into the skin 3 (three) times daily with meals.   INSULIN GLARGINE (LANTUS) 100 UNIT/ML INJECTION    Inject 0.6 mLs (60 Units total) into the skin at bedtime.   INSULIN SYRINGES, DISPOSABLE, U-100 1 ML MISC    12 Units by Does not apply route 3 (three) times daily before meals.   ISOSORBIDE MONONITRATE (IMDUR) 30 MG 24 HR TABLET    Take 1 tablet (30 mg total) by mouth daily.   MAGNESIUM OXIDE 200 MG TABS    Take 2 tablets (400 mg total) by mouth 2 (two) times daily.   MILRINONE (PRIMACOR) 20 MG/100ML SOLN INFUSION    Inject 45.4125 mcg/min into the vein continuous.   OXYCODONE-ACETAMINOPHEN (ROXICET) 5-325 MG PER TABLET    Take 1 tablet by mouth every 6 (six) hours as needed for pain.   PANTOPRAZOLE (PROTONIX) 40 MG TABLET    Take 1 tablet (40 mg total) by mouth daily.   POTASSIUM CHLORIDE SA (K-DUR,KLOR-CON) 20  MEQ TABLET    Take 2 tablets (40 mEq total) by mouth daily.   SPIRONOLACTONE (ALDACTONE) 25 MG TABLET    Take 1 tablet (25 mg total) by mouth daily.   TORSEMIDE (DEMADEX) 20 MG TABLET    Take 2 tablets (40 mg total) by mouth daily.  Modified Medications   No medications on file  Discontinued Medications   CEFAZOLIN IN STERILE WATER (PRESERVATIVE FREE) INJECTION    Inject 1 g into the vein every 8 (eight) hours.    Subjective: Daniel Blankenship is in for his hospital follow-up visit. He has heart failure and has been receiving IV milrinone at home. Last month he began to note that his blood sugars were much higher than normal. One lumen of his PICC clotted around that same time and his white  blood count was steadily increasing. Blood cultures were obtained on 04/17/2015 and one of 2 sets grew staph lugdenensis, a coagulase-negative staph that is known to cause severe infection similar to staph aureus. 2 more blood cultures done before starting antibiotics were negative but I elected to treat him for presumed true bacteremia because he has an [name]. TEE did not reveal any evidence of native valve endocarditis or infection of the defibrillator leads. He has now completed 5 weeks of IV cefazolin and is feeling much better. He still notes some elevation of his blood sugars. He was seen by his primary care provider last week at the [facility] clinic. His provider there considered adding more insulin but decided to continue him on his current regimen for now. He has not had any fever, chills or sweats. His PICC was replaced while he was hospitalized.  Review of Systems: Pertinent items are noted in HPI.  Past Medical History  Diagnosis Date  . Atrial flutter with rapid ventricular response   . Vascular dementia   . CHF (congestive heart failure)   . Delirium   . Ischemic cardiomyopathy   . Septic shock 12/2014  . CAD (coronary artery disease)   . Anemia   . Hypertension   . Hyperlipidemia   . Gout of hand   . [name] (automatic cardioverter/defibrillator) present   . Myocardial infarction 1979?  Marland Kitchen Pneumonia     "maybe 3 times" (04/18/2015)  . IDDM (insulin dependent diabetes mellitus)   . History of blood transfusion 1982    "related to crushed leg/knee"  . Arthritis     "hands, knees, ankles" (04/18/2015)  . Anxiety   . Panic attacks   . Acute renal failure   . 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 Smoking status: Former Smoker -- 1.50 packs/day for 14 years    Types: Cigarettes  . Smokeless tobacco: Never Used     Comment: 04/18/2015 "stopped smoking in ~ 2000"  . Alcohol Use: Yes     Comment: 04/18/2015 "stopped drinking in  the 1980's    Family History  Problem Relation Age of Onset  . Family history unknown: Yes    Allergies  Allergen Reactions  . Sulfa Antibiotics Anaphylaxis and Hives  . Sulfamethoxazole Anaphylaxis and Hives    unknown  . Zocor [Simvastatin] Other (See Comments)    Swelling, trouble breathing.  . Lisinopril Rash    Other reaction(s): Angioedema (ALLERGY/intolerance)    Objective: Filed Vitals:   05/23/15 1530  BP: 114/78  Pulse: 121  Temp: 97.9 F (36.6 C)  TempSrc: Oral  Weight: 273  lb (123.832 kg)   Body mass index is 34.12 kg/(m^2).  General: He is in very good spirits Skin: Left arm PICC site appears normal Lungs: Clear Cor: Distant but regular S1 and S2 with no murmur heard. Right anterior chest [name] site appears normal.   Problem List Items Addressed This Visit    None       Cliffton Asters, MD [facility] [phone] pager   [phone] cell 05/23/2015, 3:47 PM

## 2015-05-23 NOTE — Progress Notes (Signed)
Verbal order from Dr. Orvan Falconer, discontinue IV Cefazolin.  Phone call to [facility], RN shared Dr. Blair Dolphin order with Corrie Dandy.  Daniel Blankenship verbalized back the order.  PICC to stay in place, being used for another medication not ordered by this office.

## 2015-05-30 ENCOUNTER — Other Ambulatory Visit (HOSPITAL_COMMUNITY): Payer: Self-pay | Admitting: *Deleted

## 2015-05-30 ENCOUNTER — Telehealth (HOSPITAL_COMMUNITY): Payer: Self-pay | Admitting: *Deleted

## 2015-05-30 DIAGNOSIS — I5022 Chronic systolic (congestive) heart failure: Secondary | ICD-10-CM

## 2015-05-30 MED ORDER — MAGNESIUM OXIDE -MG SUPPLEMENT 200 MG PO TABS: 400.0000 mg | ORAL_TABLET | Freq: Three times a day (TID) | ORAL | Status: AC

## 2015-05-30 NOTE — Telephone Encounter (Signed)
Spoke with pt about lab results and he is agreeable to medication changes.  "happy birthday... Increase magnesium to 400 tid." (per andy)

## 2015-06-01 ENCOUNTER — Other Ambulatory Visit (HOSPITAL_COMMUNITY): Payer: Self-pay | Admitting: Internal Medicine

## 2015-06-06 ENCOUNTER — Encounter: Payer: Self-pay | Admitting: Internal Medicine

## 2015-06-15 ENCOUNTER — Other Ambulatory Visit (HOSPITAL_COMMUNITY): Payer: Self-pay | Admitting: Internal Medicine

## 2015-06-21 ENCOUNTER — Ambulatory Visit: Payer: Non-veteran care | Admitting: Internal Medicine

## 2015-06-29 ENCOUNTER — Other Ambulatory Visit (HOSPITAL_COMMUNITY): Payer: Self-pay | Admitting: Internal Medicine

## 2015-07-06 ENCOUNTER — Other Ambulatory Visit (HOSPITAL_COMMUNITY): Payer: Self-pay | Admitting: Internal Medicine

## 2015-07-11 ENCOUNTER — Other Ambulatory Visit (HOSPITAL_COMMUNITY): Payer: Self-pay | Admitting: Internal Medicine

## 2015-07-13 ENCOUNTER — Other Ambulatory Visit (HOSPITAL_COMMUNITY): Payer: Self-pay | Admitting: *Deleted

## 2015-07-20 ENCOUNTER — Other Ambulatory Visit (HOSPITAL_COMMUNITY): Payer: Self-pay | Admitting: Internal Medicine

## 2015-08-01 ENCOUNTER — Other Ambulatory Visit (HOSPITAL_COMMUNITY): Payer: Self-pay | Admitting: Internal Medicine

## 2015-08-03 ENCOUNTER — Ambulatory Visit (HOSPITAL_COMMUNITY)
Admission: RE | Admit: 2015-08-03 | Discharge: 2015-08-03 | Disposition: A | Discharge status: Home / Self Care | Payer: Medicare Other | Source: Ambulatory Visit | Attending: Internal Medicine | Admitting: Internal Medicine

## 2015-08-03 ENCOUNTER — Encounter (HOSPITAL_COMMUNITY): Payer: Self-pay | Admitting: *Deleted

## 2015-08-03 DIAGNOSIS — I4892 Unspecified atrial flutter: Secondary | ICD-10-CM | POA: Diagnosis not present

## 2015-08-03 DIAGNOSIS — E1122 Type 2 diabetes mellitus with diabetic chronic kidney disease: Secondary | ICD-10-CM | POA: Diagnosis not present

## 2015-08-03 DIAGNOSIS — M109 Gout, unspecified: Secondary | ICD-10-CM | POA: Insufficient documentation

## 2015-08-03 DIAGNOSIS — Z794 Long term (current) use of insulin: Secondary | ICD-10-CM | POA: Diagnosis not present

## 2015-08-03 DIAGNOSIS — R000 Tachycardia, unspecified: Secondary | ICD-10-CM | POA: Insufficient documentation

## 2015-08-03 DIAGNOSIS — I255 Ischemic cardiomyopathy: Secondary | ICD-10-CM | POA: Insufficient documentation

## 2015-08-03 DIAGNOSIS — I129 Hypertensive chronic kidney disease with stage 1 through stage 4 chronic kidney disease, or unspecified chronic kidney disease: Secondary | ICD-10-CM | POA: Insufficient documentation

## 2015-08-03 DIAGNOSIS — I2510 Atherosclerotic heart disease of native coronary artery without angina pectoris: Secondary | ICD-10-CM | POA: Insufficient documentation

## 2015-08-03 DIAGNOSIS — Z87891 Personal history of nicotine dependence: Secondary | ICD-10-CM | POA: Diagnosis not present

## 2015-08-03 DIAGNOSIS — N189 Chronic kidney disease, unspecified: Secondary | ICD-10-CM | POA: Diagnosis not present

## 2015-08-03 DIAGNOSIS — I5022 Chronic systolic (congestive) heart failure: Secondary | ICD-10-CM | POA: Diagnosis not present

## 2015-08-03 DIAGNOSIS — Z85810 Personal history of malignant neoplasm of tongue: Secondary | ICD-10-CM | POA: Diagnosis not present

## 2015-08-03 DIAGNOSIS — Z7902 Long term (current) use of antithrombotics/antiplatelets: Secondary | ICD-10-CM | POA: Insufficient documentation

## 2015-08-03 DIAGNOSIS — E785 Hyperlipidemia, unspecified: Secondary | ICD-10-CM | POA: Diagnosis not present

## 2015-08-03 DIAGNOSIS — Z79899 Other long term (current) drug therapy: Secondary | ICD-10-CM | POA: Insufficient documentation

## 2015-08-03 DIAGNOSIS — I252 Old myocardial infarction: Secondary | ICD-10-CM | POA: Diagnosis not present

## 2015-08-03 LAB — BASIC METABOLIC PANEL
Anion gap: 9 (ref 5–15)
BUN: 22 mg/dL — ABNORMAL HIGH (ref 6–20)
CO2: 24 mmol/L (ref 22–32)
CREATININE: 1.19 mg/dL (ref 0.61–1.24)
Calcium: 9.1 mg/dL (ref 8.9–10.3)
Chloride: 101 mmol/L (ref 101–111)
GFR calc Af Amer: >60 mL/min (ref >60)
GFR calc non Af Amer: >60 mL/min (ref >60)
GLUCOSE: 213 mg/dL — AB (ref 65–99)
Potassium: 3.9 mmol/L (ref 3.5–5.1)
Sodium: 134 mmol/L — ABNORMAL LOW (ref 135–145)

## 2015-08-03 LAB — LIPID PANEL
Cholesterol: 149 mg/dL (ref 0–200)
HDL: 57 mg/dL (ref >40)
LDL CALC: 74 mg/dL (ref 0–99)
TRIGLYCERIDES: 92 mg/dL (ref <150)
Total CHOL/HDL Ratio: 2.6 RATIO
VLDL: 18 mg/dL (ref 0–40)

## 2015-08-03 LAB — CBC
HEMATOCRIT: 39.1 % (ref 39.0–52.0)
Hemoglobin: 12.9 g/dL — ABNORMAL LOW (ref 13.0–17.0)
MCH: 25.9 pg — AB (ref 26.0–34.0)
MCHC: 33 g/dL (ref 30.0–36.0)
MCV: 78.4 fL (ref 78.0–100.0)
PLATELETS: 174 10*3/uL (ref 150–400)
RBC: 4.99 MIL/uL (ref 4.22–5.81)
RDW: 14.4 % (ref 11.5–15.5)
WBC: 9.5 10*3/uL (ref 4.0–10.5)

## 2015-08-03 LAB — DIGOXIN LEVEL: Digoxin Level: 0.3 ng/mL — ABNORMAL LOW (ref 0.8–2.0)

## 2015-08-03 MED ORDER — IVABRADINE HCL 5 MG PO TABS: 5.0000 mg | ORAL_TABLET | Freq: Two times a day (BID) | ORAL | Status: DC

## 2015-08-03 MED ORDER — HYDRALAZINE HCL 25 MG PO TABS: 25.0000 mg | ORAL_TABLET | Freq: Three times a day (TID) | ORAL | Status: DC

## 2015-08-03 MED ORDER — ASPIRIN EC 81 MG PO TBEC: 81.0000 mg | DELAYED_RELEASE_TABLET | Freq: Every day | ORAL | Status: AC

## 2015-08-03 NOTE — Progress Notes (Signed)
Medication Samples have been provided to the patient.  Drug name: Eliquis  Qty: 42  LOT: [id number]  Exp.Date: 09/17  The patient has been instructed regarding the correct time, dose, and frequency of taking this medication, including desired effects and most common side effects.   Mercer PodLUTTERLOH, Alvira Hecht D 10:26 AM 08/03/2015

## 2015-08-03 NOTE — Progress Notes (Signed)
Patient ID: Daniel Blankenship, male   DOB: 05-22-1956, 59 y.o.   MRN: [id number] PCP: Burton Apley at [facility] Primary Cardiologist: Dr Shirlee Latch EP: Dr Johney Frame  HPI: 59 yo with history of CAD s/p multivessel PCI, ischemic cardiomyopathy with systolic CHF, CKD, St jude ICD, and atrial flutter presented to [facility] in March 2016 with atrial flutter/RVR and acute on chronic systolic CHF. Underwent successful atrial flutter ablation on 3/25. He was lethargic post-extubation so was re-intubated. He then woke up and self-extubated. Amiodarone stopped. Maintaining NSR. Unfortunately he had low output and was discharged on home milrinone at 0.375 mcg/kg/min.   He was admitted in 7/16 with PICC-related bacteremia (Staph lugdunensis).  TEE showed no endocarditis, EF remained 15%.   He returns for follow up for heart failure. Remains on milrinone 0.375 mcg via PICC.  Says he is feeling great.  Short of breath after walking about 100 yards but able to do all ADLs.  Walks with walker when he goes longer distances.  Much better on milrinone.  No orthopnea/PND.  No chest pain.  HR persistently elevated, sinus tachy.  No evidence of recurrent atrial arrhythmias.  Weight is up, he has been eating more.  Getting ICD checked at [facility].   ECHO 01/02/2015 EF 15%  TEE 7/16 with EF 15%, moderate LV dilation, normal RV size, moderately decreased RV systolic function, mild MR.    Labs 01/12/2015: k 3.9 Creatinine 1.39 Hgb 11.7 dig level 0.4  Labs 7/16: digoxin 0.4 Labs 10/16: K 4.1, creatinine 1.17, HCT 36.7  ECG: Sinus tachycardia at 116, poor RWP  ROS: All systems negative except as listed in HPI, PMH and Problem List.  SH:  Social History   Social History  . Marital Status: Divorced    Spouse Name: N/A  . Number of Children: N/A  . Years of Education: N/A   Occupational History  . Not on file.   Social History Main Topics  . Smoking status: Former Smoker -- 1.50 packs/day for 14 years    Types:  Cigarettes  . Smokeless tobacco: Never Used     Comment: 04/18/2015 "stopped smoking in ~ 2000"  . Alcohol Use: Yes     Comment: 04/18/2015 "stopped drinking in the 1980's  . Drug Use: Not on file  . Sexual Activity: Not Currently   Other Topics Concern  . Not on file   Social History Narrative    FH:  Family History  Problem Relation Age of Onset  . Family history unknown: Yes    Past Medical History  Diagnosis Date  . Atrial flutter with rapid ventricular response   . Vascular dementia   . CHF (congestive heart failure)   . Delirium   . Ischemic cardiomyopathy   . Septic shock 12/2014  . CAD (coronary artery disease)   . Anemia   . Hypertension   . Hyperlipidemia   . Gout of hand   . [name] (automatic cardioverter/defibrillator) present   . Myocardial infarction 1979?  Marland Kitchen Pneumonia     "maybe 3 times" (04/18/2015)  . IDDM (insulin dependent diabetes mellitus)   . History of blood transfusion 1982    "related to crushed leg/knee"  . Arthritis     "hands, knees, ankles" (04/18/2015)  . Anxiety   . Panic attacks   . Acute renal failure   . 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 atorvastatin (LIPITOR) 80  MG tablet Take 40 mg by mouth daily.     . colchicine 0.6 MG tablet Take 1 tablet (0.6 mg total) by mouth daily. (Patient taking differently: Take 0.6 mg by mouth as needed. ) 30 tablet 6  . digoxin (LANOXIN) 0.125 MG tablet Take 1 tablet (0.125 mg total) by mouth daily. 30 tablet 11  . docusate sodium (COLACE) 100 MG capsule Take 200 mg by mouth daily.    . insulin aspart (NOVOLOG) 100 UNIT/ML injection Inject 12 Units into the skin 3 (three) times daily with meals. 10 mL 11  . insulin glargine (LANTUS) 100 UNIT/ML injection Inject 0.6 mLs (60 Units total) into the skin at bedtime. 10 mL 11  . Insulin Syringes, Disposable, U-100 1 ML MISC 12 Units by Does not apply route 3 (three)  times daily before meals. 100 each 6  . isosorbide mononitrate (IMDUR) 30 MG 24 hr tablet Take 1 tablet (30 mg total) by mouth daily. 30 tablet 11  . Magnesium Oxide 200 MG TABS Take 2 tablets (400 mg total) by mouth 3 (three) times daily. 120 each 6  . milrinone (PRIMACOR) 20 MG/100ML SOLN infusion Inject 45.4125 mcg/min into the vein continuous. 100 mL 11  . oxyCODONE-acetaminophen (ROXICET) 5-325 MG per tablet Take 1 tablet by mouth every 6 (six) hours as needed for pain. 120 tablet 0  . pantoprazole (PROTONIX) 40 MG tablet Take 1 tablet (40 mg total) by mouth daily. 30 tablet 11  . potassium chloride SA (K-DUR,KLOR-CON) 20 MEQ tablet Take 2 tablets (40 mEq total) by mouth daily. 60 tablet 11  . spironolactone (ALDACTONE) 25 MG tablet Take 1 tablet (25 mg total) by mouth daily. 30 tablet 11  . torsemide (DEMADEX) 20 MG tablet Take 2 tablets (40 mg total) by mouth daily. 30 tablet 6  . aspirin EC 81 MG tablet Take 1 tablet (81 mg total) by mouth daily. 90 tablet 3  . hydrALAZINE (APRESOLINE) 25 MG tablet Take 1 tablet (25 mg total) by mouth 3 (three) times daily. 270 tablet 3  . ivabradine (CORLANOR) 5 MG TABS tablet Take 1 tablet (5 mg total) by mouth 2 (two) times daily with a meal. 60 tablet 3   No current facility-administered medications for this encounter.    There were no vitals filed for this visit.  PHYSICAL EXAM:  General:  Well appearing. No resp difficulty. Ambulated in the clinic.  HEENT: normal Neck: supple. JVP 6. Carotids 2+ bilaterally; no bruits. No lymphadenopathy or thryomegaly appreciated. Cor: PMI normal. Mildly tachy, regular rate & rhythm. No rubs, gallops or murmurs. Lungs: clear Abdomen: soft, nontender, nondistended. No hepatosplenomegaly. No bruits or masses. Good bowel sounds. Extremities: no cyanosis, clubbing, rash, edema. LUE PICC double lumen PICC Neuro: alert & orientedx3, cranial nerves grossly intact. Moves all 4 extremities w/o difficulty. Affect  pleasant.  ASSESSMENT & PLAN: 1. 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  He is now milrinone dependent.  NYHA II-III. He has gained weight, but I think that his volume status is ok.  - Continue dig 0.125 mg daily, will check digoxin level today.  - Continue torsemide 40 mg po bid, KCl 40 bid. BMET/BNP today.   - Increase hydralazine back to 25 mg tid, continue Imdur 30 mg daily. BP good today.  - Continue spironolactone 25 mg daily  - If creatinine is ok, will aim to start ACEI next  appointment.  Will hold off on beta blocker for now with low output. - With persistent sinus tachycardia, will add Corlanor 5 mg bid to see if this can help his exercise tolerance.   - [facility] following for milrinone. Will continue for now. At next appointment, will check co-ox and see if we can start to wean milrinone to 0.25.  - He was evaluated for advanced therapies 2 years ago at [facility] and deemed to be a poor candidate (compliance issues and also lives alone). He had decided not to go [place].  We discussed LVAD, he was not particularly interested and he has a number of barriers: lack of social support, poorly controlled diabetes, issues with compliance.   2. Atrial flutter: S/P AFL ablation 01/05/15.  No recurrent arrhythmias.  He can stop Eliquis today and start ASA 81 daily.   3. CKD: Renal function improved, suspect now at baseline.  4. CAD: h/o PCI. No chest pain. Will continue statin and will be going back on ASA 81.  5. Gout: Stable currently.   Followup in 1 month.   Marca Ancona 08/03/2015 10:57 AM

## 2015-08-03 NOTE — Patient Instructions (Signed)
STOP Eliquis   Start Aspirin 81 mg daily  Increase Hydralazine 25 mg three times a day  Start Corlanor 5 mg twice a day  Labs today will call if abnormal   Follow up in 1 month  Do the following things EVERYDAY: 1. Weigh yourself in the morning before breakfast. Write it down and keep it in a log. 2. Take your medicines as prescribed 3. Eat low salt foods-Limit salt (sodium) to 2000 mg per day.  4. Stay as active as you can everyday 5. Limit all fluids for the day to less than 2 liters

## 2015-08-06 ENCOUNTER — Encounter (HOSPITAL_COMMUNITY): Payer: Self-pay | Admitting: *Deleted

## 2015-08-06 ENCOUNTER — Other Ambulatory Visit (HOSPITAL_COMMUNITY): Payer: Self-pay | Admitting: Internal Medicine

## 2015-08-06 MED ORDER — IVABRADINE HCL 5 MG PO TABS: 5.0000 mg | ORAL_TABLET | Freq: Two times a day (BID) | ORAL | Status: DC

## 2015-08-06 NOTE — Addendum Note (Signed)
Encounter addended by: Elaina PatteeKimberly D Lutterloh, RN on: 08/06/2015  9:07 AM<BR>     Documentation filed: Orders

## 2015-08-14 ENCOUNTER — Other Ambulatory Visit (HOSPITAL_COMMUNITY): Payer: Self-pay | Admitting: Internal Medicine

## 2015-08-15 ENCOUNTER — Other Ambulatory Visit (HOSPITAL_COMMUNITY): Payer: Self-pay | Admitting: *Deleted

## 2015-08-21 ENCOUNTER — Telehealth (HOSPITAL_COMMUNITY): Payer: Self-pay

## 2015-08-21 NOTE — Telephone Encounter (Signed)
[phone] and said that the patient has not received his  Hydralazine or Corlanor RX.  [facility]lanor to last through Sunday

## 2015-08-22 ENCOUNTER — Other Ambulatory Visit (HOSPITAL_COMMUNITY): Payer: Self-pay | Admitting: *Deleted

## 2015-08-22 MED ORDER — IVABRADINE HCL 5 MG PO TABS: 5.0000 mg | ORAL_TABLET | Freq: Two times a day (BID) | ORAL | Status: DC

## 2015-08-22 MED ORDER — HYDRALAZINE HCL 25 MG PO TABS: 25.0000 mg | ORAL_TABLET | Freq: Three times a day (TID) | ORAL | Status: DC

## 2015-08-22 NOTE — Telephone Encounter (Signed)
Medication Samples have been provided to the patient.  Drug name: Colanor 5 mg  Qty: 42  LOT: [id number]  Exp.Date: 9/17  The patient has been instructed regarding the correct time, dose, and frequency of taking this medication, including desired effects and most common side effects.   Mercer PodLUTTERLOH, Kia Varnadore D 4:24 PM 08/22/2015

## 2015-08-24 ENCOUNTER — Other Ambulatory Visit (HOSPITAL_COMMUNITY): Payer: Self-pay | Admitting: Internal Medicine

## 2015-08-31 ENCOUNTER — Other Ambulatory Visit (HOSPITAL_COMMUNITY): Payer: Self-pay | Admitting: Internal Medicine

## 2015-09-04 ENCOUNTER — Encounter (HOSPITAL_COMMUNITY)
Admission: RE | Admit: 2015-09-04 | Discharge: 2015-09-04 | Disposition: A | Discharge status: Home / Self Care | Payer: Medicare Other | Source: Ambulatory Visit | Attending: Internal Medicine | Admitting: Internal Medicine

## 2015-09-04 ENCOUNTER — Encounter (HOSPITAL_COMMUNITY): Payer: Self-pay | Admitting: *Deleted

## 2015-09-04 ENCOUNTER — Telehealth (HOSPITAL_COMMUNITY): Payer: Self-pay

## 2015-09-04 ENCOUNTER — Ambulatory Visit (HOSPITAL_COMMUNITY)
Admission: RE | Admit: 2015-09-04 | Discharge: 2015-09-04 | Disposition: A | Discharge status: Home / Self Care | Payer: Medicare Other | Source: Ambulatory Visit | Attending: Internal Medicine | Admitting: Internal Medicine

## 2015-09-04 VITALS — BP 130/68 | HR 98 | Wt 295.5 lb

## 2015-09-04 DIAGNOSIS — M109 Gout, unspecified: Secondary | ICD-10-CM | POA: Diagnosis not present

## 2015-09-04 DIAGNOSIS — I4892 Unspecified atrial flutter: Secondary | ICD-10-CM

## 2015-09-04 DIAGNOSIS — I2510 Atherosclerotic heart disease of native coronary artery without angina pectoris: Secondary | ICD-10-CM

## 2015-09-04 DIAGNOSIS — Z452 Encounter for adjustment and management of vascular access device: Secondary | ICD-10-CM | POA: Diagnosis not present

## 2015-09-04 DIAGNOSIS — N189 Chronic kidney disease, unspecified: Secondary | ICD-10-CM | POA: Diagnosis not present

## 2015-09-04 DIAGNOSIS — I5022 Chronic systolic (congestive) heart failure: Secondary | ICD-10-CM

## 2015-09-04 LAB — CARBOXYHEMOGLOBIN
Carboxyhemoglobin: 1.3 % (ref 0.5–1.5)
METHEMOGLOBIN: 0.8 % (ref 0.0–1.5)
O2 Saturation: 89.6 %
TOTAL HEMOGLOBIN: 13.2 g/dL — AB (ref 13.5–18.0)

## 2015-09-04 MED ORDER — LISINOPRIL 5 MG PO TABS: 5.0000 mg | ORAL_TABLET | Freq: Every day | ORAL | Status: DC

## 2015-09-04 MED ORDER — IVABRADINE HCL 5 MG PO TABS: 7.5000 mg | ORAL_TABLET | Freq: Two times a day (BID) | ORAL | Status: AC

## 2015-09-04 MED ORDER — HEPARIN SOD (PORK) LOCK FLUSH 100 UNIT/ML IV SOLN
INTRAVENOUS | Status: AC
  Administered 2015-09-04: 12:00:00
  Filled 2015-09-04: qty 5

## 2015-09-04 NOTE — Patient Instructions (Signed)
Increase Corlanor 7.5 mg twice a day  Start Lisinopril 5 mg daily  Follow up in 1 month

## 2015-09-04 NOTE — Progress Notes (Signed)
Patient ID: Daniel Blankenship, male   DOB: [birth year], 59 y.o.   MRN: [id number] PCP: Burton Apley at [facility] Primary Cardiologist: Dr Shirlee Latch EP: Dr Johney Frame  HPI: 59 yo with history of CAD s/p multivessel PCI, ischemic cardiomyopathy with systolic CHF, CKD, St jude ICD, and atrial flutter presented to [facility] in March 2016 with atrial flutter/RVR and acute on chronic systolic CHF. Underwent successful atrial flutter ablation on 3/25. He was lethargic post-extubation so was re-intubated. He then woke up and self-extubated. Amiodarone stopped. Maintaining NSR. Unfortunately he had low output and was discharged on home milrinone at 0.375 mcg/kg/min.   He was admitted in 7/16 with PICC-related bacteremia (Staph lugdunensis).  TEE showed no endocarditis, EF remained 15%.   He returns for follow up for heart failure. Remains on milrinone 0.375 mcg via PICC.  Says he is feeling great.  Short of breath after walking about 100 yards but able to do all ADLs.  Much better on milrinone. No orthopnea/PND.  No chest pain.  HR remains persistently elevated, sinus tachy.  He started on Corlanor last appointment.  No evidence of recurrent atrial arrhythmias.  Getting ICD checked at [facility].   ECHO 01/02/2015 EF 15%  TEE 7/16 with EF 15%, moderate LV dilation, normal RV size, moderately decreased RV systolic function, mild MR.    Labs 01/12/2015: k 3.9 Creatinine 1.39 Hgb 11.7 dig level 0.4  Labs 7/16: digoxin 0.4 Labs 10/16: K 4.1, creatinine 1.17, HCT 36.7, LDL 74, digoxin 0.3 Labs 11/16: HCT 38.8, K 4.4, creatinine 1.06  ECG: Sinus tachycardia at 104, poor RWP  ROS: All systems negative except as listed in HPI, PMH and Problem List.  SH:  Social History   Social History  . Marital Status: Divorced    Spouse Name: N/A  . Number of Children: N/A  . Years of Education: N/A   Occupational History  . Not on file.   Social History Main Topics  . Smoking status: Former Smoker -- 1.50 packs/day for 14  years    Types: Cigarettes  . Smokeless tobacco: Never Used     Comment: 04/18/2015 "stopped smoking in ~ 2000"  . Alcohol Use: Yes     Comment: 04/18/2015 "stopped drinking in the 1980's  . Drug Use: Not on file  . Sexual Activity: Not Currently   Other Topics Concern  . Not on file   Social History Narrative    FH:  Family History  Problem Relation Age of Onset  . Family history unknown: Yes    Past Medical History  Diagnosis Date  . Atrial flutter with rapid ventricular response   . Vascular dementia   . CHF (congestive heart failure)   . Delirium   . Ischemic cardiomyopathy   . Septic shock 12/2014  . CAD (coronary artery disease)   . Anemia   . Hypertension   . Hyperlipidemia   . Gout of hand   . [name] (automatic cardioverter/defibrillator) present   . Myocardial infarction [facility]) 1979?  Marland Kitchen Pneumonia     "maybe 3 times" (04/18/2015)  . IDDM (insulin dependent diabetes mellitus)   . History of blood transfusion 1982    "related to crushed leg/knee"  . Arthritis     "hands, knees, ankles" (04/18/2015)  . Anxiety   . Panic attacks   . Acute renal failure   . 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 aspirin  EC 81 MG tablet Take 1 tablet (81 mg total) by mouth daily. 90 tablet 3  . atorvastatin (LIPITOR) 80 MG tablet Take 40 mg by mouth daily.     . colchicine 0.6 MG tablet Take 1 tablet (0.6 mg total) by mouth daily. (Patient taking differently: Take 0.6 mg by mouth as needed. ) 30 tablet 6  . digoxin (LANOXIN) 0.125 MG tablet Take 1 tablet (0.125 mg total) by mouth daily. 30 tablet 11  . docusate sodium (COLACE) 100 MG capsule Take 200 mg by mouth daily.    . hydrALAZINE (APRESOLINE) 25 MG tablet Take 1 tablet (25 mg total) by mouth 3 (three) times daily. 270 tablet 3  . insulin aspart (NOVOLOG) 100 UNIT/ML injection Inject 12 Units into the skin 3 (three) times daily with meals. 10  mL 11  . insulin glargine (LANTUS) 100 UNIT/ML injection Inject 0.6 mLs (60 Units total) into the skin at bedtime. 10 mL 11  . Insulin Syringes, Disposable, U-100 1 ML MISC 12 Units by Does not apply route 3 (three) times daily before meals. 100 each 6  . isosorbide mononitrate (IMDUR) 30 MG 24 hr tablet Take 1 tablet (30 mg total) by mouth daily. 30 tablet 11  . ivabradine (CORLANOR) 5 MG TABS tablet Take 1.5 tablets (7.5 mg total) by mouth 2 (two) times daily with a meal. 180 tablet 3  . Magnesium Oxide 200 MG TABS Take 2 tablets (400 mg total) by mouth 3 (three) times daily. 120 each 6  . milrinone (PRIMACOR) 20 MG/100ML SOLN infusion Inject 45.4125 mcg/min into the vein continuous. 100 mL 11  . oxyCODONE-acetaminophen (ROXICET) 5-325 MG per tablet Take 1 tablet by mouth every 6 (six) hours as needed for pain. 120 tablet 0  . pantoprazole (PROTONIX) 40 MG tablet Take 1 tablet (40 mg total) by mouth daily. 30 tablet 11  . potassium chloride SA (K-DUR,KLOR-CON) 20 MEQ tablet Take 2 tablets (40 mEq total) by mouth daily. 60 tablet 11  . spironolactone (ALDACTONE) 25 MG tablet Take 1 tablet (25 mg total) by mouth daily. 30 tablet 11  . torsemide (DEMADEX) 20 MG tablet Take 2 tablets (40 mg total) by mouth daily. 30 tablet 6  . lisinopril (PRINIVIL,ZESTRIL) 5 MG tablet Take 1 tablet (5 mg total) by mouth daily. 90 tablet 3   No current facility-administered medications for this encounter.    Filed Vitals:   09/04/15 1014  BP: 130/68  Pulse: 98  Weight: 295 lb 8 oz (134.038 kg)  SpO2: 97%   PHYSICAL EXAM:  General:  Well appearing. No resp difficulty. Ambulated in the clinic.  HEENT: normal Neck: supple. JVP 6. Carotids 2+ bilaterally; no bruits. No lymphadenopathy or thryomegaly appreciated. Cor: PMI normal. Mildly tachy, regular rate & rhythm. No rubs, gallops or murmurs. Lungs: clear Abdomen: soft, nontender, nondistended. No hepatosplenomegaly. No bruits or masses. Good bowel  sounds. Extremities: no cyanosis, clubbing, rash, edema. LUE PICC double lumen PICC Neuro: alert & orientedx3, cranial nerves grossly intact. Moves all 4 extremities w/o difficulty. Affect pleasant.  ASSESSMENT & PLAN: 1. 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  He is now milrinone dependent.  NYHA II. He is not volume overloaded on exam. - Co-ox today > 80%, will decrease milrinone to 0.25 mcg/kg/min.  Repeat co-ox at followup appointment.   - Continue dig 0.125 mg daily, recent level ok.  - Continue  torsemide 40 mg po daily, getting weekly BMETs.   - Continue hydralazine 25 mg tid, continue Imdur 30 mg daily.  - Continue spironolactone 25 mg daily  - Start lisinopril 5 mg daily now that creatinine is 1.06.   - Will hold off on beta blocker for now with low output. - With persistent sinus tachycardia, will increase Corlanor to 7.5 mg bid to see if this can help his exercise tolerance.   - He was evaluated for advanced therapies 2 years ago at [facility] and deemed to be a poor candidate (compliance issues and also lives alone). He had decided not to go [place].  We discussed LVAD, he was not particularly interested and he has a number of barriers: lack of social support, poorly controlled diabetes, issues with compliance.   2. Atrial flutter: S/P AFL ablation 01/05/15.  No recurrent arrhythmias.  Continue ASA 81.  3. CKD: Renal function improved, suspect now at baseline. As above, starting lisinopril.  Will need to follow creatinine. 4. CAD: h/o PCI. No chest pain. Will continue statin and ASA 81 daily.  5. Gout: Stable currently.   Followup in 1 month.   Marca Ancona 09/04/2015 1:12 PM

## 2015-09-04 NOTE — Telephone Encounter (Signed)
Ms. Daniel Blankenship from [facility] reported that she could not get any blood from pick line at visit 11/21 Request we check it at visit today Did not get blood draw yesterday as ordered

## 2015-09-05 ENCOUNTER — Telehealth (HOSPITAL_COMMUNITY): Payer: Self-pay

## 2015-09-05 NOTE — Telephone Encounter (Signed)
[facility] called for verification: Patient weight = 273 lbs Order = 0.25 mcg milrinone Ordered by: Dr. Gala RomneyBensimhon

## 2015-09-10 ENCOUNTER — Telehealth (HOSPITAL_COMMUNITY): Payer: Self-pay | Admitting: *Deleted

## 2015-09-10 MED ORDER — ISOSORBIDE MONONITRATE ER 60 MG PO TB24: 60.0000 mg | ORAL_TABLET | Freq: Every day | ORAL | Status: AC

## 2015-09-10 MED ORDER — HYDRALAZINE HCL 25 MG PO TABS: 37.5000 mg | ORAL_TABLET | Freq: Three times a day (TID) | ORAL | Status: AC

## 2015-09-10 NOTE — Telephone Encounter (Signed)
[facility] has not [facility] yet [facility], but their records indicate pt had a allergy to Lisinopril in the past.  Upon review of chart pt has documented allergy to Lisinopril due to angioedema.  Will send to Dr Shirlee LatchMcLean for review

## 2015-09-10 NOTE — Telephone Encounter (Signed)
Per Dr Shirlee LatchMcLean with hx Lisinopril allergy have pt stop medication and increase Imdur to 60 mg daily and increase Hydralazine to 37.5 mg Three times a day.  Spoke w/pt he is aware and agreeable, he states he has not started Lisinopril since he has not received it yet but when it comes he will return it to the [facility].  [facility]  [facility]

## 2015-09-18 ENCOUNTER — Telehealth (HOSPITAL_COMMUNITY): Payer: Self-pay | Admitting: *Deleted

## 2015-09-18 NOTE — Telephone Encounter (Signed)
Called patient no answer. No voicemail.   "does pt have a fever and/or redness at PICC site?... If no symptoms will follow WBC next week... If symptoms need blood culture x2" -per Mardelle MatteAndy

## 2015-09-18 NOTE — Telephone Encounter (Signed)
Spoke with pt he does not have a fever and does not see any redness around his PICC.  Pt said he is scheduled to have tooth pulled that is infected and that could be causing the spike in WBC.

## 2015-09-20 ENCOUNTER — Encounter (HOSPITAL_COMMUNITY): Admission: RE | Disposition: E | Payer: Self-pay | Source: Ambulatory Visit | Attending: Interventional Cardiology

## 2015-09-20 ENCOUNTER — Inpatient Hospital Stay (HOSPITAL_COMMUNITY)
Admission: RE | Admit: 2015-09-20 | Discharge: 2015-10-14 | DRG: 314 | Disposition: E | Payer: Medicare Other | Source: Ambulatory Visit | Attending: Emergency Medicine | Admitting: Emergency Medicine

## 2015-09-20 ENCOUNTER — Telehealth: Payer: Self-pay | Admitting: Physician Assistant

## 2015-09-20 ENCOUNTER — Encounter (HOSPITAL_COMMUNITY): Payer: Self-pay

## 2015-09-20 ENCOUNTER — Emergency Department (HOSPITAL_COMMUNITY): Payer: Medicare Other

## 2015-09-20 DIAGNOSIS — Z8249 Family history of ischemic heart disease and other diseases of the circulatory system: Secondary | ICD-10-CM | POA: Diagnosis not present

## 2015-09-20 DIAGNOSIS — Z66 Do not resuscitate: Secondary | ICD-10-CM | POA: Diagnosis not present

## 2015-09-20 DIAGNOSIS — J9691 Respiratory failure, unspecified with hypoxia: Secondary | ICD-10-CM | POA: Diagnosis present

## 2015-09-20 DIAGNOSIS — N183 Chronic kidney disease, stage 3 unspecified: Secondary | ICD-10-CM | POA: Diagnosis present

## 2015-09-20 DIAGNOSIS — R6521 Severe sepsis with septic shock: Secondary | ICD-10-CM | POA: Diagnosis present

## 2015-09-20 DIAGNOSIS — Z95810 Presence of automatic (implantable) cardiac defibrillator: Secondary | ICD-10-CM

## 2015-09-20 DIAGNOSIS — R7881 Bacteremia: Secondary | ICD-10-CM | POA: Diagnosis not present

## 2015-09-20 DIAGNOSIS — F0150 Vascular dementia without behavioral disturbance: Secondary | ICD-10-CM | POA: Diagnosis present

## 2015-09-20 DIAGNOSIS — N189 Chronic kidney disease, unspecified: Secondary | ICD-10-CM | POA: Diagnosis present

## 2015-09-20 DIAGNOSIS — I471 Supraventricular tachycardia: Secondary | ICD-10-CM | POA: Diagnosis present

## 2015-09-20 DIAGNOSIS — Z794 Long term (current) use of insulin: Secondary | ICD-10-CM | POA: Diagnosis not present

## 2015-09-20 DIAGNOSIS — D696 Thrombocytopenia, unspecified: Secondary | ICD-10-CM | POA: Diagnosis present

## 2015-09-20 DIAGNOSIS — J9601 Acute respiratory failure with hypoxia: Secondary | ICD-10-CM | POA: Diagnosis present

## 2015-09-20 DIAGNOSIS — E669 Obesity, unspecified: Secondary | ICD-10-CM

## 2015-09-20 DIAGNOSIS — I252 Old myocardial infarction: Secondary | ICD-10-CM | POA: Diagnosis not present

## 2015-09-20 DIAGNOSIS — M109 Gout, unspecified: Secondary | ICD-10-CM | POA: Diagnosis present

## 2015-09-20 DIAGNOSIS — Y848 Other medical procedures as the cause of abnormal reaction of the patient, or of later complication, without mention of misadventure at the time of the procedure: Secondary | ICD-10-CM | POA: Diagnosis present

## 2015-09-20 DIAGNOSIS — Z79899 Other long term (current) drug therapy: Secondary | ICD-10-CM | POA: Diagnosis not present

## 2015-09-20 DIAGNOSIS — I5023 Acute on chronic systolic (congestive) heart failure: Secondary | ICD-10-CM | POA: Diagnosis not present

## 2015-09-20 DIAGNOSIS — E871 Hypo-osmolality and hyponatremia: Secondary | ICD-10-CM | POA: Diagnosis present

## 2015-09-20 DIAGNOSIS — I255 Ischemic cardiomyopathy: Secondary | ICD-10-CM | POA: Diagnosis present

## 2015-09-20 DIAGNOSIS — N182 Chronic kidney disease, stage 2 (mild): Secondary | ICD-10-CM

## 2015-09-20 DIAGNOSIS — E1129 Type 2 diabetes mellitus with other diabetic kidney complication: Secondary | ICD-10-CM | POA: Diagnosis present

## 2015-09-20 DIAGNOSIS — A419 Sepsis, unspecified organism: Secondary | ICD-10-CM | POA: Diagnosis not present

## 2015-09-20 DIAGNOSIS — A4150 Gram-negative sepsis, unspecified: Secondary | ICD-10-CM | POA: Diagnosis present

## 2015-09-20 DIAGNOSIS — I130 Hypertensive heart and chronic kidney disease with heart failure and stage 1 through stage 4 chronic kidney disease, or unspecified chronic kidney disease: Secondary | ICD-10-CM | POA: Diagnosis present

## 2015-09-20 DIAGNOSIS — E785 Hyperlipidemia, unspecified: Secondary | ICD-10-CM | POA: Diagnosis present

## 2015-09-20 DIAGNOSIS — E1310 Other specified diabetes mellitus with ketoacidosis without coma: Secondary | ICD-10-CM | POA: Diagnosis present

## 2015-09-20 DIAGNOSIS — Z9119 Patient's noncompliance with other medical treatment and regimen: Secondary | ICD-10-CM | POA: Diagnosis not present

## 2015-09-20 DIAGNOSIS — IMO0002 Reserved for concepts with insufficient information to code with codable children: Secondary | ICD-10-CM | POA: Diagnosis present

## 2015-09-20 DIAGNOSIS — R069 Unspecified abnormalities of breathing: Secondary | ICD-10-CM

## 2015-09-20 DIAGNOSIS — I4891 Unspecified atrial fibrillation: Secondary | ICD-10-CM | POA: Diagnosis present

## 2015-09-20 DIAGNOSIS — Z6838 Body mass index (BMI) 38.0-38.9, adult: Secondary | ICD-10-CM | POA: Diagnosis not present

## 2015-09-20 DIAGNOSIS — I5022 Chronic systolic (congestive) heart failure: Secondary | ICD-10-CM | POA: Diagnosis not present

## 2015-09-20 DIAGNOSIS — N179 Acute kidney failure, unspecified: Secondary | ICD-10-CM | POA: Diagnosis present

## 2015-09-20 DIAGNOSIS — I4892 Unspecified atrial flutter: Secondary | ICD-10-CM | POA: Diagnosis not present

## 2015-09-20 DIAGNOSIS — Z955 Presence of coronary angioplasty implant and graft: Secondary | ICD-10-CM

## 2015-09-20 DIAGNOSIS — T80211A Bloodstream infection due to central venous catheter, initial encounter: Secondary | ICD-10-CM | POA: Diagnosis present

## 2015-09-20 DIAGNOSIS — I2510 Atherosclerotic heart disease of native coronary artery without angina pectoris: Secondary | ICD-10-CM | POA: Diagnosis present

## 2015-09-20 DIAGNOSIS — R509 Fever, unspecified: Secondary | ICD-10-CM

## 2015-09-20 DIAGNOSIS — R570 Cardiogenic shock: Secondary | ICD-10-CM | POA: Diagnosis present

## 2015-09-20 DIAGNOSIS — Z87891 Personal history of nicotine dependence: Secondary | ICD-10-CM | POA: Diagnosis not present

## 2015-09-20 DIAGNOSIS — R531 Weakness: Secondary | ICD-10-CM

## 2015-09-20 DIAGNOSIS — E1165 Type 2 diabetes mellitus with hyperglycemia: Secondary | ICD-10-CM

## 2015-09-20 DIAGNOSIS — Z7982 Long term (current) use of aspirin: Secondary | ICD-10-CM

## 2015-09-20 DIAGNOSIS — I509 Heart failure, unspecified: Secondary | ICD-10-CM

## 2015-09-20 DIAGNOSIS — E1110 Type 2 diabetes mellitus with ketoacidosis without coma: Secondary | ICD-10-CM | POA: Diagnosis present

## 2015-09-20 DIAGNOSIS — I10 Essential (primary) hypertension: Secondary | ICD-10-CM | POA: Diagnosis present

## 2015-09-20 DIAGNOSIS — E1122 Type 2 diabetes mellitus with diabetic chronic kidney disease: Secondary | ICD-10-CM

## 2015-09-20 LAB — DIGOXIN LEVEL: Digoxin Level: <0.2 ng/mL — ABNORMAL LOW (ref 0.8–2.0)

## 2015-09-20 LAB — BASIC METABOLIC PANEL
Anion gap: 19 — ABNORMAL HIGH (ref 5–15)
BUN: 26 mg/dL — ABNORMAL HIGH (ref 6–20)
CO2: 17 mmol/L — ABNORMAL LOW (ref 22–32)
Calcium: 8.9 mg/dL (ref 8.9–10.3)
Chloride: 94 mmol/L — ABNORMAL LOW (ref 101–111)
Creatinine, Ser: 1.95 mg/dL — ABNORMAL HIGH (ref 0.61–1.24)
GFR calc Af Amer: 41 mL/min — ABNORMAL LOW (ref >60)
GFR calc non Af Amer: 36 mL/min — ABNORMAL LOW (ref >60)
Glucose, Bld: 459 mg/dL — ABNORMAL HIGH (ref 65–99)
Potassium: 4.1 mmol/L (ref 3.5–5.1)
Sodium: 130 mmol/L — ABNORMAL LOW (ref 135–145)

## 2015-09-20 LAB — CBC WITH DIFFERENTIAL/PLATELET
Basophils Absolute: 0 10*3/uL (ref 0.0–0.1)
Basophils Relative: 0 %
Eosinophils Absolute: 0 10*3/uL (ref 0.0–0.7)
Eosinophils Relative: 0 %
HCT: 37.4 % — ABNORMAL LOW (ref 39.0–52.0)
Hemoglobin: 12.3 g/dL — ABNORMAL LOW (ref 13.0–17.0)
Lymphocytes Relative: 2 %
Lymphs Abs: 0.4 10*3/uL — ABNORMAL LOW (ref 0.7–4.0)
MCH: 25.6 pg — ABNORMAL LOW (ref 26.0–34.0)
MCHC: 32.9 g/dL (ref 30.0–36.0)
MCV: 77.9 fL — ABNORMAL LOW (ref 78.0–100.0)
Monocytes Absolute: 0.8 10*3/uL (ref 0.1–1.0)
Monocytes Relative: 4 %
Neutro Abs: 18.3 10*3/uL — ABNORMAL HIGH (ref 1.7–7.7)
Neutrophils Relative %: 94 %
Platelets: 73 10*3/uL — ABNORMAL LOW (ref 150–400)
RBC: 4.8 MIL/uL (ref 4.22–5.81)
RDW: 15.2 % (ref 11.5–15.5)
WBC: 19.5 10*3/uL — ABNORMAL HIGH (ref 4.0–10.5)

## 2015-09-20 LAB — CBG MONITORING, ED
Glucose-Capillary: 439 mg/dL — ABNORMAL HIGH (ref 65–99)
Glucose-Capillary: 436 mg/dL — ABNORMAL HIGH (ref 65–99)

## 2015-09-20 LAB — BRAIN NATRIURETIC PEPTIDE: B Natriuretic Peptide: 182.8 pg/mL — ABNORMAL HIGH (ref 0.0–100.0)

## 2015-09-20 LAB — CBC
HEMATOCRIT: 35 % — AB (ref 39.0–52.0)
HEMOGLOBIN: 11.8 g/dL — AB (ref 13.0–17.0)
MCH: 26.1 pg (ref 26.0–34.0)
MCHC: 33.7 g/dL (ref 30.0–36.0)
MCV: 77.4 fL — ABNORMAL LOW (ref 78.0–100.0)
Platelets: 65 10*3/uL — ABNORMAL LOW (ref 150–400)
RBC: 4.52 MIL/uL (ref 4.22–5.81)
RDW: 15.2 % (ref 11.5–15.5)
WBC: 18.6 10*3/uL — AB (ref 4.0–10.5)

## 2015-09-20 LAB — MAGNESIUM
Magnesium: 1.7 mg/dL (ref 1.7–2.4)
Magnesium: 2 mg/dL (ref 1.7–2.4)

## 2015-09-20 LAB — I-STAT CG4 LACTIC ACID, ED: Lactic Acid, Venous: 4.88 mmol/L (ref 0.5–2.0)

## 2015-09-20 LAB — I-STAT TROPONIN, ED: Troponin i, poc: 0.1 ng/mL (ref 0.00–0.08)

## 2015-09-20 LAB — LACTIC ACID, PLASMA: Lactic Acid, Venous: 2.2 mmol/L (ref 0.5–2.0)

## 2015-09-20 LAB — CREATININE, SERUM
Creatinine, Ser: 2.1 mg/dL — ABNORMAL HIGH (ref 0.61–1.24)
GFR, EST AFRICAN AMERICAN: 38 mL/min — AB (ref >60)
GFR, EST NON AFRICAN AMERICAN: 33 mL/min — AB (ref >60)

## 2015-09-20 SURGERY — LEFT HEART CATH AND CORONARY ANGIOGRAPHY
Anesthesia: LOCAL

## 2015-09-20 MED ORDER — MAGNESIUM OXIDE 400 (241.3 MG) MG PO TABS
400.0000 mg | ORAL_TABLET | Freq: Three times a day (TID) | ORAL | Status: DC
  Filled 2015-09-20 (×3): qty 1

## 2015-09-20 MED ORDER — SODIUM CHLORIDE 0.9 % IV SOLN
INTRAVENOUS | Status: DC
Start: 2015-09-20 — End: 2015-09-20
  Administered 2015-09-20: 21:00:00 via INTRAVENOUS

## 2015-09-20 MED ORDER — SODIUM CHLORIDE 0.9 % IV BOLUS (SEPSIS)
500.0000 mL | Freq: Once | INTRAVENOUS | Status: AC
Start: 2015-09-20 — End: 2015-09-21
  Administered 2015-09-20: 500 mL via INTRAVENOUS

## 2015-09-20 MED ORDER — ACETAMINOPHEN 325 MG PO TABS
650.0000 mg | ORAL_TABLET | Freq: Four times a day (QID) | ORAL | Status: DC | PRN
  Administered 2015-09-21: 650 mg via ORAL
  Filled 2015-09-20: qty 2

## 2015-09-20 MED ORDER — INSULIN ASPART 100 UNIT/ML ~~LOC~~ SOLN
10.0000 [IU] | Freq: Once | SUBCUTANEOUS | Status: AC
  Administered 2015-09-20: 10 [IU] via INTRAVENOUS
  Filled 2015-09-20: qty 1

## 2015-09-20 MED ORDER — VANCOMYCIN HCL 10 G IV SOLR
1500.0000 mg | INTRAVENOUS | Status: DC
  Filled 2015-09-20: qty 1500

## 2015-09-20 MED ORDER — POTASSIUM CHLORIDE 10 MEQ/100ML IV SOLN: 10.0000 meq | INTRAVENOUS | Status: AC

## 2015-09-20 MED ORDER — SODIUM CHLORIDE 0.9 % IV SOLN
INTRAVENOUS | Status: DC
  Administered 2015-09-21: 08:00:00 via INTRAVENOUS

## 2015-09-20 MED ORDER — ASPIRIN EC 81 MG PO TBEC: 81.0000 mg | DELAYED_RELEASE_TABLET | Freq: Every day | ORAL | Status: DC

## 2015-09-20 MED ORDER — POTASSIUM CHLORIDE CRYS ER 20 MEQ PO TBCR: 40.0000 meq | EXTENDED_RELEASE_TABLET | Freq: Every day | ORAL | Status: DC

## 2015-09-20 MED ORDER — DIPHENHYDRAMINE HCL 50 MG/ML IJ SOLN
25.0000 mg | Freq: Once | INTRAMUSCULAR | Status: AC
  Administered 2015-09-20: 25 mg via INTRAVENOUS
  Filled 2015-09-20: qty 1

## 2015-09-20 MED ORDER — PANTOPRAZOLE SODIUM 40 MG PO TBEC: 40.0000 mg | DELAYED_RELEASE_TABLET | Freq: Every day | ORAL | Status: DC

## 2015-09-20 MED ORDER — DEXTROSE-NACL 5-0.45 % IV SOLN
INTRAVENOUS | Status: DC
  Administered 2015-09-21: 04:00:00 via INTRAVENOUS

## 2015-09-20 MED ORDER — SODIUM CHLORIDE 0.9 % IV SOLN
INTRAVENOUS | Status: DC
  Administered 2015-09-21: 3.2 [IU]/h via INTRAVENOUS
  Filled 2015-09-20: qty 2.5

## 2015-09-20 MED ORDER — ATORVASTATIN CALCIUM 40 MG PO TABS
40.0000 mg | ORAL_TABLET | Freq: Every day | ORAL | Status: DC
  Filled 2015-09-20: qty 1

## 2015-09-20 MED ORDER — ACETAMINOPHEN 500 MG PO TABS
1000.0000 mg | ORAL_TABLET | Freq: Once | ORAL | Status: AC
  Administered 2015-09-20: 1000 mg via ORAL
  Filled 2015-09-20: qty 2

## 2015-09-20 MED ORDER — ACETAMINOPHEN 650 MG RE SUPP: 650.0000 mg | Freq: Four times a day (QID) | RECTAL | Status: DC | PRN

## 2015-09-20 MED ORDER — ONDANSETRON HCL 4 MG/2ML IJ SOLN
4.0000 mg | Freq: Once | INTRAMUSCULAR | Status: AC
  Administered 2015-09-20: 4 mg via INTRAVENOUS
  Filled 2015-09-20: qty 2

## 2015-09-20 MED ORDER — ENOXAPARIN SODIUM 40 MG/0.4ML ~~LOC~~ SOLN
40.0000 mg | Freq: Every day | SUBCUTANEOUS | Status: DC
  Filled 2015-09-20: qty 0.4

## 2015-09-20 MED ORDER — IVABRADINE HCL 5 MG PO TABS
7.5000 mg | ORAL_TABLET | Freq: Two times a day (BID) | ORAL | Status: DC
  Filled 2015-09-20 (×3): qty 2

## 2015-09-20 MED ORDER — PIPERACILLIN-TAZOBACTAM 3.375 G IVPB 30 MIN
3.3750 g | Freq: Once | INTRAVENOUS | Status: AC
  Administered 2015-09-20: 3.375 g via INTRAVENOUS
  Filled 2015-09-20: qty 50

## 2015-09-20 MED ORDER — DIGOXIN 125 MCG PO TABS: 0.1250 mg | ORAL_TABLET | Freq: Every day | ORAL | Status: DC

## 2015-09-20 MED ORDER — VANCOMYCIN HCL 10 G IV SOLR
2500.0000 mg | Freq: Once | INTRAVENOUS | Status: AC
  Administered 2015-09-20: 2500 mg via INTRAVENOUS
  Filled 2015-09-20: qty 2500

## 2015-09-20 NOTE — ED Notes (Signed)
Pt given glass of water per MD verbal order.

## 2015-09-20 NOTE — ED Notes (Signed)
Pt CBG is 439. Nurse notified. 

## 2015-09-20 NOTE — Telephone Encounter (Signed)
Pt has been complaining of increasing SOB. He has a cough productive of white phlegm. He last waited 3 days ago when his weight at that time was 293. There is no information currently available about compliance with medications. His friend came over and was very concerned about him. According to her, he is in significant distress.  Advised her the best plan is to call 911 and bring him to the emergency room. She is in agreement.  Leanna BattlesBarrett, Ranie Chinchilla, PA-C 09/18/2015 6:41 PM Beeper [phone]

## 2015-09-20 NOTE — ED Notes (Signed)
Pt CBG is 436. Nurse Notified.

## 2015-09-20 NOTE — Progress Notes (Signed)
ANTIBIOTIC CONSULT NOTE - INITIAL  Pharmacy Consult for vancomycin Indication: rule out sepsis  Allergies  Allergen Reactions  . Sulfa Antibiotics Anaphylaxis and Hives  . Sulfamethoxazole Anaphylaxis and Hives    unknown  . Zocor [Simvastatin] Other (See Comments)    Swelling, trouble breathing.  . Lisinopril Rash    Other reaction(s): Angioedema (ALLERGY/intolerance)    Patient Measurements:   Adjusted Body Weight:   Vital Signs: Temp: 104.4 F (40.2 C) (12/08 2002) Temp Source: Rectal (12/08 2002) BP: 117/83 mmHg (12/08 1933) Pulse Rate: 138 (12/08 1933) Intake/Output from previous day:   Intake/Output from this shift:    Labs:  Recent Labs  10/08/2015 1950  WBC 19.5*  HGB 12.3*  PLT 73*  CREATININE 1.95*   CrCl cannot be calculated (Unknown ideal weight.). No results for input(s): VANCOTROUGH, VANCOPEAK, VANCORANDOM, GENTTROUGH, GENTPEAK, GENTRANDOM, TOBRATROUGH, TOBRAPEAK, TOBRARND, AMIKACINPEAK, AMIKACINTROU, AMIKACIN in the last 72 hours.   Microbiology: No results found for this or any previous visit (from the past 720 hour(s)).  Medical History: Past Medical History  Diagnosis Date  . Atrial flutter with rapid ventricular response (HCC)   . Vascular dementia   . CHF (congestive heart failure) (HCC)   . Delirium   . Ischemic cardiomyopathy   . Septic shock (HCC) 12/2014  . CAD (coronary artery disease)   . Anemia   . Hypertension   . Hyperlipidemia   . Gout of hand   . [name] (automatic cardioverter/defibrillator) present   . Myocardial infarction [facility](HCC) 1979?  Marland Kitchen. Pneumonia     "maybe 3 times" (04/18/2015)  . IDDM (insulin dependent diabetes mellitus) (HCC)   . History of blood transfusion 1982    "related to crushed leg/knee"  . Arthritis     "hands, knees, ankles" (04/18/2015)  . Anxiety   . Panic attacks   . Acute renal failure (HCC)   . 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"    Medications:  Anti-infectives    Start      Dose/Rate Route Frequency Ordered Stop   05/11/2015 2200  vancomycin (VANCOCIN) 1,500 mg in sodium chloride 0.9 % 500 mL IVPB     1,500 mg 250 mL/hr over 120 Minutes Intravenous Every 24 hours 09/24/2015 2109     10/04/2015 2015  piperacillin-tazobactam (ZOSYN) IVPB 3.375 g     3.375 g 100 mL/hr over 30 Minutes Intravenous  Once 09/19/2015 2010     10/01/2015 2015  vancomycin (VANCOCIN) 2,500 mg in sodium chloride 0.9 % 500 mL IVPB     2,500 mg 250 mL/hr over 120 Minutes Intravenous  Once 09/19/2015 2014       Assessment: 59 yom presented to the ED as a Code STEMI but this was subsequently cancelled. To start empiric broad-spectrum antibiotics for possible sepsis. Tmax is 104.4 and WBC is elevated at 19.5. SCr is elevated at 1.95 and lactic acid is elevated.   Vanc 12/8>> Zosyn x 1 12/8  Goal of Therapy:  Vancomycin trough level 15-20 mcg/ml  Plan:  - Vancomycin 2500mg  IV x 1 then 1500mg  IV Q24H - F/u renal fxn, C&S, clinical status and trough at SS - Continue zosyn or other gram negative coverage?  Cari Vandeberg, Drake LeachRachel Lynn 10/02/2015,9:09 PM

## 2015-09-20 NOTE — ED Notes (Signed)
Pt has a eryrthma and [facility] [place] MD notified. See new orders.

## 2015-09-20 NOTE — Progress Notes (Signed)
   Called for Code STEMI on this complex patient, with EF 15% on home milrinone.  SPoke to ER physician who states that the patient has no chest pain.  Subtle ST segment changes noted on ECG but patient with significant tachycardia.  Will cancel cath procedure at this time and ER MD to call back there are clinical changes that would necessitate cardiac cath.  Corky CraftsJayadeep S Bernadene Garside, MD

## 2015-09-20 NOTE — H&P (Signed)
History and Physical  Patient Name: Daniel Blankenship     LGX:[id number]    DOB: [birth year]    DOA: 10/13/15 Referring physician: Raeford Razor, MD PCP: PROVIDER NOT IN SYSTEM      Chief Complaint: Fever and malaise  HPI: Daniel Blankenship is a 59 y.o. male with a past medical history significant for chronic systolic CHF on home milrinone with ICD, IDDM, AF not on warfarin, and IDDM who presents with fever.  The patient was in his usual state of health until two days ago when he developed malaise, nausea, vague abdominal discomfort and emesis 1.  Today, he had fever and then a pre-syncopal episode, and so he came to the ER.  He has mild cough, dark urine, phlegm.  He has not been eating anything and not taking insulin.  He has no leg swelling, weight gain, chest discomfort.  In the ED, the patient was febrile to 104.30F, tachycardic to 136, with AKI, acidosis with elevated anion gap, leukocytosis, elevated troponin, and lactic acid 4.88 mmol/L.  Flu swab was negative.  Blood and urine cultures were drawn, and normal saline, vancomycin and Zosyn were administered, and TR[facility] were asked to admit for presumed sepsis and DKA.     Review of Systems:  Pt complains of fever, malaise, vague abdominal discomfort, nausea, emesis of phlegm, dark urine, cough. All other systems negative except as just noted or noted in the history of present illness.  Allergies  Allergen Reactions  . Sulfa Antibiotics Anaphylaxis and Hives  . Sulfamethoxazole Anaphylaxis and Hives    unknown  . Zocor [Simvastatin] Other (See Comments)    Swelling, trouble breathing.  . Lisinopril Rash    Other reaction(s): Angioedema (ALLERGY/intolerance)    Prior to Admission medications   Medication Sig Start Date End Date Taking? Authorizing Provider  aspirin EC 81 MG tablet Take 1 tablet (81 mg total) by mouth daily. 08/03/15  Yes Laurey Morale, MD  atorvastatin (LIPITOR) 80 MG tablet Take 40 mg by mouth daily.    Yes  Historical Provider, MD  colchicine 0.6 MG tablet Take 1 tablet (0.6 mg total) by mouth daily. Patient taking differently: Take 0.6 mg by mouth as needed.  04/24/15  Yes Leone Brand, NP  digoxin (LANOXIN) 0.125 MG tablet Take 1 tablet (0.125 mg total) by mouth daily. 01/12/15  Yes Rhonda G Barrett, PA-C  docusate sodium (COLACE) 100 MG capsule Take 200 mg by mouth daily.   Yes Historical Provider, MD  hydrALAZINE (APRESOLINE) 25 MG tablet Take 1.5 tablets (37.5 mg total) by mouth 3 (three) times daily. 09/10/15  Yes Laurey Morale, MD  insulin aspart (NOVOLOG) 100 UNIT/ML injection Inject 12 Units into the skin 3 (three) times daily with meals. 04/24/15  Yes Leone Brand, NP  insulin glargine (LANTUS) 100 UNIT/ML injection Inject 0.6 mLs (60 Units total) into the skin at bedtime. 04/24/15  Yes Leone Brand, NP  isosorbide mononitrate (IMDUR) 60 MG 24 hr tablet Take 1 tablet (60 mg total) by mouth daily. 09/10/15  Yes Laurey Morale, MD  ivabradine (CORLANOR) 5 MG TABS tablet Take 1.5 tablets (7.5 mg total) by mouth 2 (two) times daily with a meal. 09/04/15  Yes Laurey Morale, MD  Magnesium Oxide 200 MG TABS Take 2 tablets (400 mg total) by mouth 3 (three) times daily. 05/30/15  Yes Mariam Dollar Tillery, PA-C  milrinone [facility]) 20 MG/100ML SOLN infusion Inject 45.4125 mcg/min into the vein continuous. 04/24/15  Yes  Leone BrandLaura R Ingold, NP  oxyCODONE-acetaminophen (ROXICET) 5-325 MG per tablet Take 1 tablet by mouth every 6 (six) hours as needed for pain. 04/14/13  Yes Tiffany L Reed, DO  pantoprazole (PROTONIX) 40 MG tablet Take 1 tablet (40 mg total) by mouth daily. 01/12/15  Yes Rhonda G Barrett, PA-C  potassium chloride SA (K-DUR,KLOR-CON) 20 MEQ tablet Take 2 tablets (40 mEq total) by mouth daily. 04/24/15  Yes Leone BrandLaura R Ingold, NP  spironolactone (ALDACTONE) 25 MG tablet Take 1 tablet (25 mg total) by mouth daily. 01/12/15  Yes Rhonda G Barrett, PA-C  torsemide (DEMADEX) 20 MG tablet Take 2 tablets  (40 mg total) by mouth daily. 04/24/15  Yes Leone BrandLaura R Ingold, NP  Insulin Syringes, Disposable, U-100 1 ML MISC 12 Units by Does not apply route 3 (three) times daily before meals. 04/24/15   Leone BrandLaura R Ingold, NP    Past Medical History  Diagnosis Date  . Atrial flutter with rapid ventricular response (HCC)   . Vascular dementia   . CHF (congestive heart failure) (HCC)   . Delirium   . Ischemic cardiomyopathy   . Septic shock (HCC) 12/2014  . CAD (coronary artery disease)   . Anemia   . Hypertension   . Hyperlipidemia   . Gout of hand   . [name] (automatic cardioverter/defibrillator) present   . Myocardial infarction [facility](HCC) 1979?  Marland Kitchen. Pneumonia     "maybe 3 times" (04/18/2015)  . IDDM (insulin dependent diabetes mellitus) (HCC)   . History of blood transfusion 1982    "related to crushed leg/knee"  . Arthritis     "hands, knees, ankles" (04/18/2015)  . Anxiety   . Panic attacks   . Acute renal failure (HCC)   . 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 Cardioversion    . Cardiac defibrillator placement  ~ 2013  . Atrial flutter ablation N/A 01/05/2015    Procedure: ATRIAL FLUTTER ABLATION;  Surgeon: Hillis RangeJames Allred, MD;  [facility];  Service: Cardiovascular;  Laterality: N/A;  . Tee without cardioversion N/A 01/05/2015    Procedure: TRANSESOPHAGEAL ECHOCARDIOGRAM (TEE);  Surgeon: Laurey Moralealton S McLean, MD;  [facility];  Service: Cardiovascular;  Laterality: N/A;  . Appendectomy    . Tibia fracture surgery Left 1982    "crushed my thigh/leg/knee"  . Patella reconstruction Left 1982  . Ankle fracture surgery Bilateral 1980's  . Joint replacement    . Fracture surgery    . Coronary angioplasty with stent placement  ~ 2013  . Tee without cardioversion N/A 04/23/2015    Procedure: TRANSESOPHAGEAL ECHOCARDIOGRAM (TEE);  Surgeon: Laurey Moralealton S McLean, MD;  [facility];  Service: Cardiovascular;   Laterality: N/A;    Family history: family history includes Heart attack in his father; Lung cancer in his father.  Social History: Patient lives with his wife.   he is from [place] originally.  He  reports that he has quit smoking. His smoking use included Cigarettes. He has a 21 pack-year smoking history. He has never used smokeless tobacco. He reports that he drinks alcohol. His drug history is not on file.     Physical Exam: BP 107/85 mmHg  Pulse 120  Temp(Src) 98.9 F (37.2 C) (Oral)  Resp 17  Ht 6\' 1"  (1.854 m)  Wt 131.543 kg (290 lb)  BMI 38.27 kg/m2  SpO2 93% General appearance: Large adult male, alert and in moderate distress  from illness and fever.   Eyes: Anicteric, lids and lashes normal.     ENT: No nasal deformity, discharge, or epistaxis.  OP moist without lesions.   Skin: Warm and sweaty. Cardiac: Tachycardic, regular, nl S1-S2, no murmurs appreciated.  Capillary refill is brisk.  No JVD.  No LE edema.  Respiratory: Normal respiratory effort.  CTAB without rales.  No cough. Abdomen: Abdomen soft without rigidity.  Mild diffuse TTP. Mild distension, no ascites.   MSK: No deformities or effusions. Neuro: Sensorium intact and responding to questions, attention normal.  Speech is fluent.  Moves all extremities equally and with normal coordination.    Psych: Behavior appropriate.  Affect normal.  No evidence of aural or visual hallucinations or delusions.       Labs on Admission:  The metabolic panel shows hyponatremia, low bicarbonate, serum creatinine 1.959 g/dL from a baseline of 1.2 mg/dL, and anion gap 19, glucose 459  milligrams per deciliter. The troponin is 0.1 ng/mL. The digoxin level is undetectable. Magnesium is 1.7. BNP slightly elevated 128 pg/mL Lactic acid level high at 4.88 mmol/L. The complete blood count shows leukocytosis, thrombocytopenia, new.   Radiological Exams on Admission: Personally reviewed: Dg Chest Portable 1 View  10/03/2015   CLIJan 03, 2017DATA:  Weakness and shortness of Breath EXAM: PORTABLE CHEST - 1 VIEW COMPARISON:  01/06/2015 FINDINGS: Defibrillator is again seen and stable. Cardiac shadow remains enlarged. The previously seen left jugular central line is been removed and a new left PICC line placed in the mid superior vena cava. The lungs are well aerated. Mild bibasilar atelectatic changes are seen. No focal confluent infiltrate is noted. IMPRESSION: Bibasilar atelectasis. PICC line as described. Electronically Signed   By: Alcide Clever M.D.   On: October 16, 2015 19:50    EKG: Independently reviewed. Sinus tachycardia.  STE in inferior leads, as documented in Dr. Hoyle Barr note, not suspected to be ischemic.    Assessment/Plan 1. Sepsis:  This is new.  Suspected source: unknown. Organism unknown. Patient meets criteria given tachycardia, tachypnea, fever, leukocytosis, and evidence of organ dysfunction.  Blood and urine cultures drawn.  Lactate exceeds 2 mmol/L and repeat ordered within 6 hours.  MAP > 65 mmHg.  Influenza negative. -Vancomycin and piperacillin-tazobactam -Follow blood and urine culture -Trend lactic acid     2. Acidosis and hyperglycemia:  Hyperglycemia, low bicarbonate, and ketonuria. -Insulin infusion, per protocol  -BMP every four hours for now -Cautious K supplementation for now  3. AKI:  AKI in setting of sepsis and systolic HF. -Fluid resuscitation and monitor  4. Thrombocytopenia:  New.  Unclear etiology.  Possibly sepsis related. -Trend CBC  5. Elevated troponin:  Suspect demand ischemia.  Agree ACS doubted. -Trend troponin  6. Ischemic cardiomyopathy with ICD:  Last EF 15% by TEE in July 2016.  Currently euvolemic and at dry weight. -Hold hydralazine, Imdur and diuretics until hemodynamically stable. -Continue milrinone infusion -Continue digoxin, aspirin, statin      DVT PPx: Lovenox Diet: NPO for now Consultants: Cardiology Code Status: Full Family Communication:  Partner, present at bedside  Medical decision making: What exists of the patient's previous chart was reviewed in depth and the case was discussed with Dr. Juleen China. Patient seen 10:33 PM on 16-Oct-2015.  Disposition Plan:  Admit to stepdown for sepsis without source.  Broad spectrum antibiotics and consultation with Cardiology for guidance regarding hemodynamic optimization.  Anticipate >3 days hospitalization.      Daniel Blankenship [facility] Pager [phone]

## 2015-09-20 NOTE — ED Provider Notes (Signed)
CSN: [id number]     Arrival date & time 09/19/2015  1928 History   None    Chief Complaint  Patient presents with  . Chest Pain  . Fall     (Consider location/radiation/quality/duration/timing/severity/associated sxs/prior Treatment) HPI   59 yo brought in by EMS after a fall. Significant past medical history including CAD with multivessel PCI, ischemic cardiomyopathy with systolic CHF, s/p ICD, DM, CKD, atrial flutter s/p ablation earlier this year and discharged on milrinone. Admit in July with PICC line related bacteremia. TEE then w/o endocarditis, EF 15%. Has remained on milrinone but decreased to 0.25 mcg/kg/min at time of last HF follow-up about two weeks ago.  Patient states that he just felt very weak and was able to lower himself to the ground. Did not have pain, dyspnea, palpitations, diaphoresis, etc. Says he just felt like his legs wouldn't support him anymore. Did not strike his head. Denies loss of consciousness. He was  made "Code STEMI" prehospital because of EKG changes inferiorly.  Patient denies any chest pain or dyspnea though. He actually denied this to EMS as well but crew noted abnormality on 3 lead monitoring which prompted a subsequent 12-lead EKG. He has been having some nausea today. No vomiting. No abdominal pain. Generalized weakness for the past two days. No fever. No cough. No increased edema. Reports compliance with his medications.   Past Medical History  Diagnosis Date  . Atrial flutter with rapid ventricular response ([place])   . Vascular dementia   . CHF (congestive heart failure) ([place])   . Delirium   . Ischemic cardiomyopathy   . Septic shock ([place]) 12/2014  . CAD (coronary artery disease)   . Anemia   . Hypertension   . Hyperlipidemia   . Gout of hand   . [name] (automatic cardioverter/defibrillator) present   . Myocardial infarction [facility]) 1979?  Marland Kitchen Pneumonia     "maybe 3 times" (04/18/2015)  . IDDM (insulin dependent diabetes mellitus) ([place])   . History  of blood transfusion 1982    "related to crushed leg/knee"  . Arthritis     "hands, knees, ankles" (04/18/2015)  . Anxiety   . Panic attacks   . Acute renal failure ([place])   . 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 Cardioversion    . Cardiac defibrillator placement  ~ 2013  . Atrial flutter ablation N/A 01/05/2015    Procedure: ATRIAL FLUTTER ABLATION;  Surgeon: Thompson Grayer, MD;  Location: [facility] CATH LAB;  Service: Cardiovascular;  Laterality: N/A;  . Tee without cardioversion N/A 01/05/2015    Procedure: TRANSESOPHAGEAL ECHOCARDIOGRAM (TEE);  Surgeon: Larey Dresser, MD;  Location: [place];  Service: Cardiovascular;  Laterality: N/A;  . Appendectomy    . Tibia fracture surgery Left 1982    "crushed my thigh/leg/knee"  . Patella reconstruction Left 1982  . Ankle fracture surgery Bilateral 1980's  . Joint replacement    . Fracture surgery    . Coronary angioplasty with stent placement  ~ 2013  . Tee without cardioversion N/A 04/23/2015    Procedure: TRANSESOPHAGEAL ECHOCARDIOGRAM (TEE);  Surgeon: Larey Dresser, MD;  Location: [facility] ENDOSCOPY;  Service: Cardiovascular;  Laterality: N/A;   Family History  Problem Relation Age of Onset  . Family history unknown: Yes   Social History  Substance Use Topics  . Smoking status: Former Smoker -- 1.50 packs/day for 14 years    Types: Cigarettes  .  Smokeless tobacco: Never Used     Comment: 04/18/2015 "stopped smoking in ~ 2000"  . Alcohol Use: Yes     Comment: 04/18/2015 "stopped drinking in the 1980's    Review of Systems  All systems reviewed and negative, other than as noted in HPI.   Allergies  Sulfa antibiotics; Sulfamethoxazole; Zocor; and Lisinopril  Home Medications   Prior to Admission medications   Medication Sig Start Date End Date Taking? Authorizing Provider  aspirin EC 81 MG tablet Take 1 tablet (81 mg total) by mouth daily. 08/03/15   Larey Dresser, MD  atorvastatin (LIPITOR) 80 MG tablet Take 40 mg by mouth daily.     Historical Provider, MD  colchicine 0.6 MG tablet Take 1 tablet (0.6 mg total) by mouth daily. Patient taking differently: Take 0.6 mg by mouth as needed.  04/24/15   Isaiah Serge, NP  digoxin (LANOXIN) 0.125 MG tablet Take 1 tablet (0.125 mg total) by mouth daily. 01/12/15   Rhonda G Barrett, PA-C  docusate sodium (COLACE) 100 MG capsule Take 200 mg by mouth daily.    Historical Provider, MD  hydrALAZINE (APRESOLINE) 25 MG tablet Take 1.5 tablets (37.5 mg total) by mouth 3 (three) times daily. 09/10/15   Larey Dresser, MD  insulin aspart (NOVOLOG) 100 UNIT/ML injection Inject 12 Units into the skin 3 (three) times daily with meals. 04/24/15   Isaiah Serge, NP  insulin glargine (LANTUS) 100 UNIT/ML injection Inject 0.6 mLs (60 Units total) into the skin at bedtime. 04/24/15   Isaiah Serge, NP  Insulin Syringes, Disposable, U-100 1 ML MISC 12 Units by Does not apply route 3 (three) times daily before meals. 04/24/15   Isaiah Serge, NP  isosorbide mononitrate (IMDUR) 60 MG 24 hr tablet Take 1 tablet (60 mg total) by mouth daily. 09/10/15   Larey Dresser, MD  ivabradine (CORLANOR) 5 MG TABS tablet Take 1.5 tablets (7.5 mg total) by mouth 2 (two) times daily with a meal. 09/04/15   Larey Dresser, MD  Magnesium Oxide 200 MG TABS Take 2 tablets (400 mg total) by mouth 3 (three) times daily. 05/30/15   Shirley Friar, PA-C  milrinone [facility]) 20 MG/100ML SOLN infusion Inject 45.4125 mcg/min into the vein continuous. 04/24/15   Isaiah Serge, NP  oxyCODONE-acetaminophen (ROXICET) 5-325 MG per tablet Take 1 tablet by mouth every 6 (six) hours as needed for pain. 04/14/13   Tiffany L Reed, DO  pantoprazole (PROTONIX) 40 MG tablet Take 1 tablet (40 mg total) by mouth daily. 01/12/15   Rhonda G Barrett, PA-C  potassium chloride SA (K-DUR,KLOR-CON) 20 MEQ tablet Take 2 tablets (40 mEq total) by mouth daily. 04/24/15   Isaiah Serge, NP  spironolactone (ALDACTONE) 25 MG tablet Take 1 tablet (25 mg total) by mouth daily. 01/12/15   Rhonda G Barrett, PA-C  torsemide (DEMADEX) 20 MG tablet Take 2 tablets (40 mg total) by mouth daily. 04/24/15   Isaiah Serge, NP   BP 117/83 mmHg  Pulse 138  Temp(Src) 98.4 F (36.9 C) (Oral)  Resp 27  SpO2 95% Physical Exam  Constitutional:  Laying in bed. Appears mildly uncomfortable. Obese.   HENT:  Head: Normocephalic and atraumatic.  Eyes: Conjunctivae are normal. Pupils are equal, round, and reactive to light. Right eye exhibits no discharge. Left eye exhibits no discharge.  Neck: Neck supple.  Cardiovascular: Normal rate, regular rhythm and normal heart sounds.  Exam reveals no gallop and  no friction rub.   No murmur heard. Tachycardic. Regular. No murmur appreciated. ICD R chest. PICC LUE. Milrinone infusion.   Pulmonary/Chest: Effort normal and breath sounds normal. No respiratory distress.  Abdominal: Soft. He exhibits no distension. There is no tenderness.  Musculoskeletal: He exhibits no edema or tenderness.  Neurological: He is alert.  Skin: Skin is warm and dry.  Psychiatric: He has a normal mood and affect. His behavior is normal. Thought content normal.  Nursing note and vitals reviewed.   ED Course  Procedures (including critical care time) Labs Review Labs Reviewed  CBC WITH DIFFERENTIAL/PLATELET - Abnormal; Notable for the following:    WBC 19.5 (*)    Hemoglobin 12.3 (*)    HCT 37.4 (*)    MCV 77.9 (*)    MCH 25.6 (*)    Platelets 73 (*)    Neutro Abs 18.3 (*)    Lymphs Abs 0.4 (*)    All other components within normal limits  BRAIN NATRIURETIC PEPTIDE - Abnormal; Notable for the following:    B Natriuretic Peptide 182.8 (*)    All other components within normal limits  DIGOXIN LEVEL - Abnormal; Notable for the following:    Digoxin Level <0.2 (*)    All other components within normal limits  BASIC METABOLIC PANEL - Abnormal; Notable for the  following:    Sodium 130 (*)    Chloride 94 (*)    CO2 17 (*)    Glucose, Bld 459 (*)    BUN 26 (*)    Creatinine, Ser 1.95 (*)    GFR calc non Af Amer 36 (*)    GFR calc Af Amer 41 (*)    Anion gap 19 (*)    All other components within normal limits  URINALYSIS, ROUTINE W REFLEX MICROSCOPIC (NOT AT [facility]) - Abnormal; Notable for the following:    Color, Urine AMBER (*)    APPearance TURBID (*)    Glucose, UA >1000 (*)    Hgb urine dipstick LARGE (*)    Bilirubin Urine SMALL (*)    Ketones, ur 15 (*)    Protein, ur 100 (*)    All other components within normal limits  CBC - Abnormal; Notable for the following:    WBC 18.6 (*)    Hemoglobin 11.8 (*)    HCT 35.0 (*)    MCV 77.4 (*)    Platelets 65 (*)    All other components within normal limits  CREATININE, SERUM - Abnormal; Notable for the following:    Creatinine, Ser 2.10 (*)    GFR calc non Af Amer 33 (*)    GFR calc Af Amer 38 (*)    All other components within normal limits  LACTIC ACID, PLASMA - Abnormal; Notable for the following:    Lactic Acid, Venous 2.2 (*)    All other components within normal limits  URINE MICROSCOPIC-ADD ON - Abnormal; Notable for the following:    Squamous Epithelial / LPF 0-5 (*)    Bacteria, UA MANY (*)    Casts GRANULAR CAST (*)    All other components within normal limits  I-STAT TROPOININ, ED - Abnormal; Notable for the following:    Troponin i, poc 0.10 (*)    All other components within normal limits  CBG MONITORING, ED - Abnormal; Notable for the following:    Glucose-Capillary 439 (*)    All other components within normal limits  I-STAT CG4 LACTIC ACID, ED - Abnormal; Notable for the  following:    Lactic Acid, Venous 4.88 (*)    All other components within normal limits  CBG MONITORING, ED - Abnormal; Notable for the following:    Glucose-Capillary 436 (*)    All other components within normal limits  CBG MONITORING, ED - Abnormal; Notable for the following:     Glucose-Capillary 384 (*)    All other components within normal limits  CULTURE, BLOOD (ROUTINE X 2)  CULTURE, BLOOD (ROUTINE X 2)  MAGNESIUM  MAGNESIUM  INFLUENZA PANEL BY PCR (TYPE A & B, H1N1)  TROPONIN I  TROPONIN I  CBC WITH DIFFERENTIAL/PLATELET  BASIC METABOLIC PANEL  LACTIC ACID, PLASMA  TROPONIN I    Imaging Review No results found. I have personally reviewed and evaluated these images and lab results as part of my medical decision-making.   EKG Interpretation   Date/Time:  Thursday September 20 2015 19:29:05 EST Ventricular Rate:  135 PR Interval:  126 QRS Duration: 119 QT Interval:  402 QTC Calculation: 603 R Axis:   -96 Text Interpretation:  Age not entered, assumed to be  59 years old for  purpose of ECG interpretation Sinus tachycardia Left atrial enlargement  Nonspecific IVCD with LAD ST elevation, consider inferior injury Anterior  infarct, old Confirmed by Reneisha Stilley  MD, Shann Lewellyn ([id number]) on 09/14/2015 7:47:59  PM      MDM   Final diagnoses:  Generalized weakness  Fever, unspecified fever cause  AKI (acute kidney injury) ([place])    59 year old male with generalized weakness and nausea. He does seem some what uncomfortable, but not out of proportion for what I would expect a milrinone dependent person with an EF of 15% to look like. Made "code STEMI" prehospital. Patient does have some ST segment elevation inferiorly which is changed from recent. He continues to deny any chest pain or dyspnea. Brief phone discussion with Dr Beau Fanny, interventional cardiologist. With atypical symptoms and a myriad of other medical problems, electing to work-up further in ED and defer from cath lab at this time. CODE STEMI canceled. Aspirin prehospital.   8:16 PM Rectal temp 104. Blood cultures added, particularly with PICC. Externally it looks ok. Empiric abx. Check UA. Tylenol.     Virgel Manifold, MD 09-28-2015 [phone]

## 2015-09-20 NOTE — ED Notes (Signed)
Pt CBG is 439. Nurse notified.

## 2015-09-20 NOTE — ED Notes (Signed)
[facility] due to a fall at home, Pt denies LOC or hitting head. Pt states his legs just gave out on him so he lowered himself to the floor. Pt states feeling week the last 2 days and that he has not ate today or taken his med's. Pt denies pain, SOB, but has some nausea. Per EMS CBG prior to arrival was 438, BP 130/60, Heart rate 110. Pt has a history of CHF and has a defibrillator.

## 2015-09-21 ENCOUNTER — Inpatient Hospital Stay (HOSPITAL_COMMUNITY): Payer: Medicare Other

## 2015-09-21 DIAGNOSIS — I4892 Unspecified atrial flutter: Secondary | ICD-10-CM

## 2015-09-21 DIAGNOSIS — J9601 Acute respiratory failure with hypoxia: Secondary | ICD-10-CM | POA: Diagnosis present

## 2015-09-21 DIAGNOSIS — R6521 Severe sepsis with septic shock: Secondary | ICD-10-CM

## 2015-09-21 DIAGNOSIS — I5023 Acute on chronic systolic (congestive) heart failure: Secondary | ICD-10-CM

## 2015-09-21 DIAGNOSIS — R7881 Bacteremia: Secondary | ICD-10-CM

## 2015-09-21 DIAGNOSIS — R579 Shock, unspecified: Secondary | ICD-10-CM

## 2015-09-21 DIAGNOSIS — I5022 Chronic systolic (congestive) heart failure: Secondary | ICD-10-CM

## 2015-09-21 DIAGNOSIS — N179 Acute kidney failure, unspecified: Secondary | ICD-10-CM | POA: Insufficient documentation

## 2015-09-21 DIAGNOSIS — A419 Sepsis, unspecified organism: Secondary | ICD-10-CM | POA: Diagnosis present

## 2015-09-21 LAB — POCT I-STAT 3, ART BLOOD GAS (G3+)
ACID-BASE DEFICIT: 11 mmol/L — AB (ref 0.0–2.0)
BICARBONATE: 16.1 meq/L — AB (ref 20.0–24.0)
O2 SAT: 86 %
PO2 ART: 60 mmHg — AB (ref 80.0–100.0)
Patient temperature: 98.6
TCO2: 17 mmol/L (ref 0–100)
pCO2 arterial: 37.5 mmHg (ref 35.0–45.0)
pH, Arterial: 7.24 — ABNORMAL LOW (ref 7.350–7.450)

## 2015-09-21 LAB — CBC WITH DIFFERENTIAL/PLATELET
Basophils Absolute: 0 10*3/uL (ref 0.0–0.1)
Basophils Relative: 0 %
EOS ABS: 0 10*3/uL (ref 0.0–0.7)
Eosinophils Relative: 0 %
HCT: 37.2 % — ABNORMAL LOW (ref 39.0–52.0)
Hemoglobin: 12.7 g/dL — ABNORMAL LOW (ref 13.0–17.0)
Lymphocytes Relative: 7 %
Lymphs Abs: 1 10*3/uL (ref 0.7–4.0)
MCH: 26.3 pg (ref 26.0–34.0)
MCHC: 34.1 g/dL (ref 30.0–36.0)
MCV: 77 fL — ABNORMAL LOW (ref 78.0–100.0)
MONO ABS: 0.4 10*3/uL (ref 0.1–1.0)
Monocytes Relative: 3 %
NEUTROS PCT: 90 %
Neutro Abs: 13.2 10*3/uL — ABNORMAL HIGH (ref 1.7–7.7)
PLATELETS: 60 10*3/uL — AB (ref 150–400)
RBC: 4.83 MIL/uL (ref 4.22–5.81)
RDW: 15.3 % (ref 11.5–15.5)
WBC: 14.6 10*3/uL — AB (ref 4.0–10.5)

## 2015-09-21 LAB — I-STAT ARTERIAL BLOOD GAS, ED
ACID-BASE DEFICIT: 9 mmol/L — AB (ref 0.0–2.0)
Bicarbonate: 20.4 mEq/L (ref 20.0–24.0)
O2 SAT: 87 %
PH ART: 7.126 — AB (ref 7.350–7.450)
TCO2: 22 mmol/L (ref 0–100)
pCO2 arterial: 64.7 mmHg (ref 35.0–45.0)
pO2, Arterial: 85 mmHg (ref 80.0–100.0)

## 2015-09-21 LAB — INFLUENZA PANEL BY PCR (TYPE A & B)
H1N1 flu by pcr: NOT DETECTED
Influenza A By PCR: NEGATIVE
Influenza B By PCR: NEGATIVE

## 2015-09-21 LAB — BASIC METABOLIC PANEL
ANION GAP: 10 (ref 5–15)
BUN: 27 mg/dL — ABNORMAL HIGH (ref 6–20)
CO2: 22 mmol/L (ref 22–32)
Calcium: 8.2 mg/dL — ABNORMAL LOW (ref 8.9–10.3)
Chloride: 99 mmol/L — ABNORMAL LOW (ref 101–111)
Creatinine, Ser: 1.99 mg/dL — ABNORMAL HIGH (ref 0.61–1.24)
GFR calc Af Amer: 40 mL/min — ABNORMAL LOW (ref >60)
GFR, EST NON AFRICAN AMERICAN: 35 mL/min — AB (ref >60)
Glucose, Bld: 279 mg/dL — ABNORMAL HIGH (ref 65–99)
POTASSIUM: 4 mmol/L (ref 3.5–5.1)
SODIUM: 131 mmol/L — AB (ref 135–145)

## 2015-09-21 LAB — URINALYSIS, ROUTINE W REFLEX MICROSCOPIC
Glucose, UA: >1000 mg/dL — AB
KETONES UR: 15 mg/dL — AB
LEUKOCYTES UA: NEGATIVE
NITRITE: NEGATIVE
PROTEIN: 100 mg/dL — AB
Specific Gravity, Urine: 1.028 (ref 1.005–1.030)
pH: 5 (ref 5.0–8.0)

## 2015-09-21 LAB — CBG MONITORING, ED
GLUCOSE-CAPILLARY: 179 mg/dL — AB (ref 65–99)
GLUCOSE-CAPILLARY: 216 mg/dL — AB (ref 65–99)
GLUCOSE-CAPILLARY: 238 mg/dL — AB (ref 65–99)
GLUCOSE-CAPILLARY: 311 mg/dL — AB (ref 65–99)
GLUCOSE-CAPILLARY: 384 mg/dL — AB (ref 65–99)
Glucose-Capillary: 325 mg/dL — ABNORMAL HIGH (ref 65–99)
Glucose-Capillary: 169 mg/dL — ABNORMAL HIGH (ref 65–99)

## 2015-09-21 LAB — URINE MICROSCOPIC-ADD ON

## 2015-09-21 LAB — TROPONIN I: Troponin I: 0.15 ng/mL — ABNORMAL HIGH (ref <0.031)

## 2015-09-21 LAB — GLUCOSE, CAPILLARY: Glucose-Capillary: 183 mg/dL — ABNORMAL HIGH (ref 65–99)

## 2015-09-21 MED ORDER — FENTANYL CITRATE (PF) 100 MCG/2ML IJ SOLN
INTRAMUSCULAR | Status: AC
  Administered 2015-09-21: 100 ug
  Filled 2015-09-21: qty 2

## 2015-09-21 MED ORDER — SODIUM BICARBONATE 8.4 % IV SOLN
50.0000 meq | Freq: Once | INTRAVENOUS | Status: AC
  Administered 2015-09-21: 50 meq via INTRAVENOUS
  Filled 2015-09-21: qty 50

## 2015-09-21 MED ORDER — ROCURONIUM BROMIDE 50 MG/5ML IV SOLN
INTRAVENOUS | Status: AC | PRN
  Administered 2015-09-21: 100 mg via INTRAVENOUS

## 2015-09-21 MED ORDER — MIDAZOLAM HCL 2 MG/2ML IJ SOLN
INTRAMUSCULAR | Status: AC
  Administered 2015-09-21: 2 mg
  Filled 2015-09-21: qty 4

## 2015-09-21 MED ORDER — PHENYLEPHRINE HCL 10 MG/ML IJ SOLN
0.0000 ug/min | INTRAMUSCULAR | Status: DC
  Filled 2015-09-21: qty 4

## 2015-09-21 MED ORDER — POTASSIUM CHLORIDE CRYS ER 20 MEQ PO TBCR
40.0000 meq | EXTENDED_RELEASE_TABLET | Freq: Every day | ORAL | Status: DC
  Administered 2015-09-21: 40 meq via ORAL
  Filled 2015-09-21: qty 2

## 2015-09-21 MED ORDER — ETOMIDATE 2 MG/ML IV SOLN
INTRAVENOUS | Status: AC | PRN
  Administered 2015-09-21: 15 mg via INTRAVENOUS

## 2015-09-21 MED ORDER — SODIUM CHLORIDE 0.9 % IV BOLUS (SEPSIS)
500.0000 mL | Freq: Once | INTRAVENOUS | Status: AC
  Administered 2015-09-21: 500 mL via INTRAVENOUS

## 2015-09-21 MED ORDER — DOBUTAMINE IN D5W 4-5 MG/ML-% IV SOLN
INTRAVENOUS | Status: AC
  Filled 2015-09-21: qty 250

## 2015-09-21 MED ORDER — DEXTROSE 5 % IV SOLN
0.0000 ug/min | INTRAVENOUS | Status: DC
  Administered 2015-09-21: 100 ug/min via INTRAVENOUS
  Filled 2015-09-21: qty 1

## 2015-09-21 MED ORDER — PIPERACILLIN-TAZOBACTAM 3.375 G IVPB
3.3750 g | Freq: Three times a day (TID) | INTRAVENOUS | Status: DC
  Filled 2015-09-21 (×3): qty 50

## 2015-09-21 MED ORDER — VASOPRESSIN 20 UNIT/ML IV SOLN
0.0300 [IU]/min | INTRAVENOUS | Status: DC
  Filled 2015-09-21: qty 2

## 2015-09-21 MED ORDER — AMIODARONE HCL IN DEXTROSE 360-4.14 MG/200ML-% IV SOLN
60.0000 mg/h | INTRAVENOUS | Status: AC
  Administered 2015-09-21: 60 mg/h via INTRAVENOUS
  Filled 2015-09-21: qty 200

## 2015-09-21 MED ORDER — STERILE WATER FOR INJECTION IV SOLN
INTRAVENOUS | Status: DC
  Filled 2015-09-21: qty 850

## 2015-09-21 MED ORDER — PHENYLEPHRINE HCL 10 MG/ML IJ SOLN
0.0000 ug/min | INTRAVENOUS | Status: DC
  Filled 2015-09-21 (×2): qty 1

## 2015-09-21 MED ORDER — PANTOPRAZOLE SODIUM 40 MG IV SOLR: 40.0000 mg | INTRAVENOUS | Status: DC

## 2015-09-21 MED ORDER — FENTANYL CITRATE (PF) 100 MCG/2ML IJ SOLN
INTRAMUSCULAR | Status: AC
  Filled 2015-09-21: qty 2

## 2015-09-21 MED ORDER — SODIUM CHLORIDE 0.9 % IJ SOLN: 10.0000 mL | INTRAMUSCULAR | Status: DC | PRN

## 2015-09-21 MED ORDER — MILRINONE IN DEXTROSE 20 MG/100ML IV SOLN
0.3750 ug/kg/min | INTRAVENOUS | Status: DC
  Filled 2015-09-21: qty 100

## 2015-09-21 MED ORDER — AMIODARONE LOAD VIA INFUSION
150.0000 mg | Freq: Once | INTRAVENOUS | Status: AC
  Administered 2015-09-21: 150 mg via INTRAVENOUS
  Filled 2015-09-21: qty 83.34

## 2015-09-21 MED ORDER — SODIUM CHLORIDE 0.9 % IV BOLUS (SEPSIS): 500.0000 mL | Freq: Once | INTRAVENOUS | Status: DC

## 2015-09-21 MED ORDER — AMIODARONE HCL IN DEXTROSE 360-4.14 MG/200ML-% IV SOLN
30.0000 mg/h | INTRAVENOUS | Status: DC
  Filled 2015-09-21: qty 200

## 2015-09-21 MED ORDER — NOREPINEPHRINE BITARTRATE 1 MG/ML IV SOLN
0.0000 ug/min | INTRAVENOUS | Status: DC
  Filled 2015-09-21: qty 4

## 2015-09-22 LAB — URINE CULTURE: CULTURE: NO GROWTH

## 2015-09-22 MED FILL — Medication: Qty: 1 | Status: AC

## 2015-09-23 LAB — CULTURE, BLOOD (ROUTINE X 2)

## 2015-09-25 ENCOUNTER — Other Ambulatory Visit (HOSPITAL_COMMUNITY): Payer: Self-pay | Admitting: Internal Medicine

## 2015-10-14 NOTE — ED Notes (Signed)
Pt transferred to hospital bed for comfort.

## 2015-10-14 NOTE — Progress Notes (Signed)
ANTIBIOTIC CONSULT NOTE - INITIAL  Pharmacy Consult for Adding Zosyn (already on vanco) Indication: rule out sepsis  Allergies  Allergen Reactions  . Sulfa Antibiotics Anaphylaxis and Hives  . Sulfamethoxazole Anaphylaxis and Hives    unknown  . Zocor [Simvastatin] Other (See Comments)    Swelling, trouble breathing.  . Lisinopril Rash    Other reaction(s): Angioedema (ALLERGY/intolerance)    Patient Measurements: Height: 6\' 1"  (185.4 cm) Weight: 290 lb (131.543 kg) IBW/kg (Calculated) : 79.9   Vital Signs: Temp: 104.1 F (40.1 C) (12/09 0604) Temp Source: Rectal (12/09 0604) BP: 82/52 mmHg (12/09 0700) Pulse Rate: 166 (12/09 0700)  Labs:  Recent Labs  Sep 21, 2015 1950 Sep 21, 2015 2300 09/27/2015 0356  WBC 19.5* 18.6* 14.6*  HGB 12.3* 11.8* 12.7*  PLT 73* 65* 60*  CREATININE 1.95* 2.10* 1.99*   Estimated Creatinine Clearance: 56.1 mL/min (by C-G formula based on Cr of 1.99).   Medical History: Past Medical History  Diagnosis Date  . Atrial flutter with rapid ventricular response (HCC)   . Vascular dementia   . CHF (congestive heart failure) (HCC)   . Delirium   . Ischemic cardiomyopathy   . Septic shock (HCC) 12/2014  . CAD (coronary artery disease)   . Anemia   . Hypertension   . Hyperlipidemia   . Gout of hand   . [name] (automatic cardioverter/defibrillator) present   . Myocardial infarction [facility](HCC) 1979?  Marland Kitchen. Pneumonia     "maybe 3 times" (04/18/2015)  . IDDM (insulin dependent diabetes mellitus) (HCC)   . History of blood transfusion 1982    "related to crushed leg/knee"  . Arthritis     "hands, knees, ankles" (04/18/2015)  . Anxiety   . Panic attacks   . Acute renal failure (HCC)   . 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   Plan:  -Zosyn 3.375G IV q8h to be infused over 4  hours -Already on vanco -F/U infectious work-up  Abran DukeLedford, Depaul Arizpe 09/23/2015,7:30 AM

## 2015-10-14 NOTE — Progress Notes (Signed)
RT attempted arterial line per MD with no success. MD aware.

## 2015-10-14 NOTE — Code Documentation (Signed)
Pt. Was shocked By Dr. Eartha Inchalton McClain at 150 joules, no change is his Rythmn. Pt. Was shocked By Dr. Eartha Inchalton McClain at 200 joules, no change in his rythmn

## 2015-10-14 NOTE — ED Notes (Signed)
Dr. Delton CoombesByrum is aware of arrival of family and they need to be updated

## 2015-10-14 NOTE — ED Notes (Signed)
Pt CBG is 311. Nurse notified

## 2015-10-14 NOTE — Procedures (Signed)
Central Venous Catheter Insertion Procedure Note Tommas OlpChristopher T Commisso [id number] 11/08/1955  Procedure: Insertion of Central Venous Catheter Indications: Assessment of intravascular volume, Drug and/or fluid administration and Frequent blood sampling  Procedure Details Consent: Unable to obtain consent because of emergent medical necessity. Time Out: Verified patient identification, verified procedure, site/side was marked, verified correct patient position, special equipment/implants available, medications/allergies/relevent history reviewed, required imaging and test results available.  Performed  Maximum sterile technique was used including antiseptics, cap, gloves, gown, hand hygiene, mask and sheet. Skin prep: Chlorhexidine; local anesthetic administered A antimicrobial bonded/coated triple lumen catheter was placed in the left internal jugular vein using the Seldinger technique. Ultrasound guidance used.Yes.   Catheter placed to 20 cm. Blood aspirated via all 3 ports and then flushed x 3. Line sutured x 2 and dressing applied.  Evaluation Blood flow good Complications: No apparent complications Patient did tolerate procedure well. Chest X-ray ordered to verify placement.  CXR: normal.  Brett CanalesSteve Minor ACNP Adolph PollackLe Bauer P[facility]M Pager [phone] till 3 pm If no answer page [phone] 10/03/2015, 8:59 AM   Levy Pupaobert Byrum, MD, PhD 09/30/2015, 9:33 AM [facility] [phone] or if no answer [phone]

## 2015-10-14 NOTE — Progress Notes (Addendum)
Patient arrived from ED at approx. 9:30 in A-flutter went pulseless before moving to our bed.  Code was called (see code sheet) and after all efforts had been made patient was changed to DNR after MD talked with family.  Patient's family came back to room and patient was pronounced dead by DO at 10:20.  Was not able to get initial assessment because of code.  Family remains with patient and comfort cart brought in.

## 2015-10-14 NOTE — Procedures (Signed)
Intubation Procedure Note Daniel Blankenship [id number] [birth year]  Procedure: Intubation Indications: Respiratory insufficiency  Procedure Details Consent: Unable to obtain consent because of altered level of consciousness. Time Out: Verified patient identification, verified procedure, site/side was marked, verified correct patient position, special equipment/implants available, medications/allergies/relevent history reviewed, required imaging and test results available.  Performed  Maximum sterile technique was used including cap, gloves, gown, hand hygiene and mask.  MAC and 3    Evaluation Hemodynamic Status: BP stable throughout; O2 sats: stable throughout Patient's Current Condition: stable Complications: No apparent complications Patient did tolerate procedure well. Chest X-ray ordered to verify placement.  CXR: pending.   Daniel Blankenship, Daniel Blankenship 10/07/2015  0700

## 2015-10-14 NOTE — ED Notes (Signed)
MD at bedside. 

## 2015-10-14 NOTE — ED Notes (Signed)
Danford MD at bedside. 

## 2015-10-14 NOTE — Code Documentation (Signed)
Levophed stopped per Critical care NP

## 2015-10-14 NOTE — Progress Notes (Signed)
CDS called at 10:45.  Not a candidate for donation

## 2015-10-14 NOTE — Progress Notes (Signed)
Called back to room around 0600 for return of fever to 104F.  Patient now tachycardic up to 150 bpm and less coherent, mottled at knees, shaky.  Working to breathe and splinting.  Called CCM for consultation.  EDP aware and transfer to trauma bay.

## 2015-10-14 NOTE — Consult Note (Signed)
Advanced Heart Failure Team Consult Note  Referring Physician: Dr Maryfrances Bunnell Primary Physician: Primary Cardiologist:  Dr Shirlee Latch  Reason for Consultation: Heart Failure   HPI:   Daniel Blankenship is a 60 year old with a history of CAD s/p multivessel PCI, ischemic cardiomyopathy with systolic CHF, CKD, St jude ICD, and atrial flutter presented to [facility] in March 2016 with atrial flutter/RVR and acute on chronic systolic CHF. Underwent successful atrial flutter ablation on 3/25.   He was evaluated for advanced therapies 2 years ago at [facility] and deemed to be a poor candidate (compliance issues and also lives alone). LVAD had been considered however he was not a good candidate due to number of barriers: lack of social support, poorly controlled diabetes, issues with compliance.   He is on chronic home milrinone.  In July 2016 he had PICC related bacteremia (Staph lugdunesis). Had TEE--> no vegetation.   He has been followed closely in the HF clinic and was last seen November 22nd. Acutally doing well with NYHA II functional class.  At that time milrinone was cut back to 0.25 mcg.      Prior to admit had 2 days of malaise, nausea, vague abdominal discomfort and emesis 1. Today, he had fever and then a pre-syncopal episode, and so he came to the ER.  In ED he was febrile with T 104 and tachycardic heart 136 with AKI, acidosis with elevated anion gap, leukocytosis WBC 19.5, elevated troponin 0.10, and lactic acid 4.88 mmol/L. No chest pain and minimal EKG changes. Evaluated by Dr Eldridge Dace and Code Stemi was cancelled. Flu swab was negative. Blood and urine cultures were drawn, and normal saline, vancomycin and Zosyn . 2/2 blood cultures + for gram + cocci in clusters.   Earlier this am he declined with increased respiratory effort and altered mental status. CCM consulted for intubation. Central line placed and R groin A line started. Milrinone was stopped. Started on Neo. Developed A flutter RVR with heart  rate in 160s requiring cardioversion x 5 unfortunately this was unsuccessful. He was then loaded on amiodarone. Heart rate remained 130-140s. ABG PH 7.1 and given Bicarb.     Review of Systems: [y] = yes,  = no  Intubated . History taken from chart . General: Weight gain ; Weight loss ; Anorexia ; Fatigue ; Fever ; Chills ; Weakness   Cardiac: Chest pain/pressure ; Resting SOB ; Exertional SOB ; Orthopnea ; Pedal Edema ; Palpitations ; Syncope ; Presyncope ; Paroxysmal nocturnal dyspnea[ ]   Pulmonary: Cough ; Wheezing[ ] ; Hemoptysis[ ] ; Sputum ; Snoring   GI: Vomiting[ ] ; Dysphagia[ ] ; Melena[ ] ; Hematochezia ; Heartburn[ ] ; Abdominal pain ; Constipation ; Diarrhea ; BRBPR   GU: Hematuria[ ] ; Dysuria ; Nocturia[ ]   Vascular: Pain in legs with walking ; Pain in feet with lying flat ; Non-healing sores ; Stroke ; TIA ; Slurred speech ;  Neuro: Headaches[ ] ; Vertigo[ ] ; Seizures[ ] ; Paresthesias[ ] ;Blurred vision ; Diplopia ; Vision changes   Ortho/Skin: Arthritis ; Joint pain ; Muscle pain ; Joint swelling ; Back Pain ; Rash   Psych: Depression[ ] ; Anxiety[ ]   Heme: Bleeding problems ; Clotting disorders ; Anemia   Endocrine: Diabetes ; Thyroid dysfunction[ ]   Home Medications Prior to Admission medications   Medication Sig Start Date End Date Taking? Authorizing Provider  aspirin EC 81 MG tablet Take 1 tablet (81 mg total) by mouth daily. 08/03/15  Yes Laurey Morale, MD  atorvastatin (LIPITOR) 80 MG tablet Take 40 mg by mouth daily.    Yes Historical Provider, MD  colchicine 0.6 MG tablet Take 1 tablet (0.6 mg total) by mouth daily. Patient taking differently: Take 0.6 mg by mouth as needed.  04/24/15  Yes Leone Brand, NP  digoxin (LANOXIN) 0.125 MG tablet Take 1 tablet (0.125 mg total) by mouth daily. 01/12/15  Yes Rhonda G Barrett, PA-C  docusate sodium  (COLACE) 100 MG capsule Take 200 mg by mouth daily.   Yes Historical Provider, MD  hydrALAZINE (APRESOLINE) 25 MG tablet Take 1.5 tablets (37.5 mg total) by mouth 3 (three) times daily. 09/10/15  Yes Laurey Morale, MD  insulin aspart (NOVOLOG) 100 UNIT/ML injection Inject 12 Units into the skin 3 (three) times daily with meals. 04/24/15  Yes Leone Brand, NP  insulin glargine (LANTUS) 100 UNIT/ML injection Inject 0.6 mLs (60 Units total) into the skin at bedtime. 04/24/15  Yes Leone Brand, NP  isosorbide mononitrate (IMDUR) 60 MG 24 hr tablet Take 1 tablet (60 mg total) by mouth daily. 09/10/15  Yes Laurey Morale, MD  ivabradine (CORLANOR) 5 MG TABS tablet Take 1.5 tablets (7.5 mg total) by mouth 2 (two) times daily with a meal. 09/04/15  Yes Laurey Morale, MD  Magnesium Oxide 200 MG TABS Take 2 tablets (400 mg total) by mouth 3 (three) times daily. 05/30/15  Yes Mariam Dollar Tillery, PA-C  milrinone [facility]) 20 MG/100ML SOLN infusion Inject 45.4125 mcg/min into the vein continuous. 04/24/15  Yes Leone Brand, NP  oxyCODONE-acetaminophen (ROXICET) 5-325 MG per tablet Take 1 tablet by mouth every 6 (six) hours as needed for pain. 04/14/13  Yes Tiffany L Reed, DO  pantoprazole (PROTONIX) 40 MG tablet Take 1 tablet (40 mg total) by mouth daily. 01/12/15  Yes Rhonda G Barrett, PA-C  potassium chloride SA (K-DUR,KLOR-CON) 20 MEQ tablet Take 2 tablets (40 mEq total) by mouth daily. 04/24/15  Yes Leone Brand, NP  spironolactone (ALDACTONE) 25 MG tablet Take 1 tablet (25 mg total) by mouth daily. 01/12/15  Yes Rhonda G Barrett, PA-C  torsemide (DEMADEX) 20 MG tablet Take 2 tablets (40 mg total) by mouth daily. 04/24/15  Yes Leone Brand, NP  Insulin Syringes, Disposable, U-100 1 ML MISC 12 Units by Does not apply route 3 (three) times daily before meals. 04/24/15   Leone Brand, NP    Past Medical History: Past Medical History  Diagnosis Date  . Atrial flutter with rapid ventricular response  (HCC)   . Vascular dementia   . CHF (congestive heart failure) (HCC)   . Delirium   . Ischemic cardiomyopathy   . Septic shock (HCC) 12/2014  . CAD (coronary artery disease)   . Anemia   . Hypertension   . Hyperlipidemia   . Gout of hand   . [name] (automatic cardioverter/defibrillator) present   . Myocardial infarction [facility]) 1979?  Marland Kitchen Pneumonia     "maybe 3 times" (04/18/2015)  . IDDM (insulin dependent diabetes mellitus) (HCC)   . History of blood transfusion 1982    "related to crushed leg/knee"  . Arthritis     "hands, knees, ankles" (04/18/2015)  . Anxiety   . Panic attacks   . Acute renal  failure (HCC)   . 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 Cardioversion    . Cardiac defibrillator placement  ~ 2013  . Atrial flutter ablation N/A 01/05/2015    Procedure: ATRIAL FLUTTER ABLATION;  Surgeon: Hillis RangeJames Allred, MD;  [facility];  Service: Cardiovascular;  Laterality: N/A;  . Tee without cardioversion N/A 01/05/2015    Procedure: TRANSESOPHAGEAL ECHOCARDIOGRAM (TEE);  Surgeon: Laurey Moralealton S Trilby Way, MD;  [facility];  Service: Cardiovascular;  Laterality: N/A;  . Appendectomy    . Tibia fracture surgery Left 1982    "crushed my thigh/leg/knee"  . Patella reconstruction Left 1982  . Ankle fracture surgery Bilateral 1980's  . Joint replacement    . Fracture surgery    . Coronary angioplasty with stent placement  ~ 2013  . Tee without cardioversion N/A 04/23/2015    Procedure: TRANSESOPHAGEAL ECHOCARDIOGRAM (TEE);  Surgeon: Laurey Moralealton S Rajanae Mantia, MD;  [facility];  Service: Cardiovascular;  Laterality: N/A;    Family History: Family History  Problem Relation Age of Onset  . Lung cancer Father   . Heart attack Father     Social History: Social History   Social History  . Marital Status: Divorced    Spouse Name: N/A  . Number of Children: N/A  . Years of  Education: N/A   Social History Main Topics  . Smoking status: Former Smoker -- 1.50 packs/day for 14 years    Types: Cigarettes  . Smokeless tobacco: Never Used     Comment: 04/18/2015 "stopped smoking in ~ 2000"  . Alcohol Use: Yes     Comment: 04/18/2015 "stopped drinking in the 1980's  . Drug Use: None  . Sexual Activity: Not Currently   Other Topics Concern  . None   Social History Narrative    Allergies:  Allergies  Allergen Reactions  . Sulfa Antibiotics Anaphylaxis and Hives  . Sulfamethoxazole Anaphylaxis and Hives    unknown  . Zocor [Simvastatin] Other (See Comments)    Swelling, trouble breathing.  . Lisinopril Rash    Other reaction(s): Angioedema (ALLERGY/intolerance)    Objective:    Vital Signs:   Temp:  [98.4 F (36.9 C)-104.5 F (40.3 C)] 104.5 F (40.3 C) (12/09 0730) Pulse Rate:  [33-166] 165 (12/09 0730) Resp:  [13-38] 15 (12/09 0730) BP: (70-131)/(35-117) 86/54 mmHg (12/09 0730) SpO2:  [90 %-100 %] 96 % (12/09 0730) FiO2 (%):  [100 %] 100 % (12/09 0700) Weight:  [290 lb (131.543 kg)] 290 lb (131.543 kg) (12/08 2201)    Weight change: Filed Weights   07-Dec-2014 2201  Weight: 290 lb (131.543 kg)    Intake/Output:   Intake/Output Summary (Last 24 hours) at 09/19/2015 0824 Last data filed at 09/25/2015 0509  Gross per 24 hour  Intake   1480 ml  Output    450 ml  Net   1030 ml     Physical Exam: General:  Intubated. Flat in bed HEENT: Intubated Neck: supple. JVP6-7 . LIJ. Carotids 2+ bilat; no bruits. No lymphadenopathy or thryomegaly appreciated. Cor: PMI nondisplaced. Tachy Regular  rate & rhythm. No rubs, or murmurs. + S3  Lungs: Decreased.  Abdomen: obese, soft, nontender, nondistended. No hepatosplenomegaly. No bruits or masses. No bowel counds. Good bowel sounds. Extremities: no cyanosis, clubbing, rash, edema. Extremities cool. R groin A line. LUE PICC Neuro: Intubated unable to assess.   Telemetry: A flutter/A  tach  RVR.    Labs: Basic Metabolic Panel:  Recent Labs Lab 09/26/2015 1950 09/26/2015 2300 10-21-15 0356  NA 130*  --  131*  K 4.1  --  4.0  CL 94*  --  99*  CO2 17*  --  22  GLUCOSE 459*  --  279*  BUN 26*  --  27*  CREATININE 1.95* 2.10* 1.99*  CALCIUM 8.9  --  8.2*  MG 1.7 2.0  --     Liver Function Tests: No results for input(s): AST, ALT, ALKPHOS, BILITOT, PROT, ALBUMIN in the last 168 hours. No results for input(s): LIPASE, AMYLASE in the last 168 hours. No results for input(s): AMMONIA in the last 168 hours.  CBC:  Recent Labs Lab 09/25/2015 1950 10/01/2015 2300 2015/10/21 0356  WBC 19.5* 18.6* 14.6*  NEUTROABS 18.3*  --  13.2*  HGB 12.3* 11.8* 12.7*  HCT 37.4* 35.0* 37.2*  MCV 77.9* 77.4* 77.0*  PLT 73* 65* 60*    Cardiac Enzymes:  Recent Labs Lab 10-21-2015 0356  TROPONINI 0.15*    BNP: BNP (last 3 results)  Recent Labs  12/31/14 1430 10/01/2015 1950  BNP 386.4* 182.8*    ProBNP (last 3 results) No results for input(s): PROBNP in the last 8760 hours.   CBG:  Recent Labs Lab 10-21-15 0154 2015/10/21 0259 October 21, 2015 0400 10-21-2015 0513 October 21, 2015 0619  GLUCAP 325* 311* 238* 216* 179*    Coagulation Studies: No results for input(s): LABPROT, INR in the last 72 hours.  Other results: EKG:  Imaging: Dg Chest Port 1 View  2015-10-21  CLINICAL DATA:  60 year old male status post central line placement. EXAM: PORTABLE CHEST 1 VIEW COMPARISON:  Chest x-ray-2015/10/21. FINDINGS: Interval placement of a new left IJ central venous catheter with tip terminating in the distal superior vena cava. Previously noted left subclavian catheter with tip terminating in the distal superior vena cava is unchanged. An endotracheal tube is in place with tip 4.5 cm above the carina. A nasogastric tube is seen extending into the stomach, however, the tip of the nasogastric tube extends below the lower margin of the image. Right-sided pacemaker/[name] with lead tip terminating over the  expected location of the right ventricular apex. Percutaneous defibrillator pads are noted projecting over the lower left hemithorax. Lung volumes are very low. No pneumothorax. There is cephalization of the pulmonary vasculature, indistinctness of the interstitial markings, and patchy airspace disease throughout the lungs bilaterally suggestive of moderate pulmonary edema. Moderate cardiomegaly. Small left pleural effusion. Upper mediastinal contours are distorted by patient positioning. IMPRESSION: 1. Support apparatus, as above. 2. No pneumothorax or other acute complicating features following central line placement. 3. Findings, as above, suggestive of worsening moderate congestive heart failure. Electronically Signed   By: Trudie Reed M.D.   On: Oct 21, 2015 08:07   Dg Chest Portable 1 View  10-21-15  CLINICAL DATA:  New endotracheal tube EXAM: PORTABLE CHEST 1 VIEW COMPARISON:  None. FINDINGS: Endotracheal to 3.3 cm from carina. Stable enlarged cardiac silhouette. Perihilar airspace disease slightly increased from prior. No pneumothorax. LEFT PICC line in place. IMPRESSION: 1. Interval intubation and NG tube placement without complication. 2. Interval increase in central airspace disease representing edema versus pneumonia or aspiration pneumonitis. Electronically Signed   By: Genevive Bi M.D.   On: October 21, 2015 07:43   Dg Chest Portable 1 View  09/29/2015  CLINICAL DATA:  Weakness and shortness of Breath EXAM: PORTABLE CHEST - 1 VIEW COMPARISON:  01/06/2015 FINDINGS: Defibrillator is again seen and  stable. Cardiac shadow remains enlarged. The previously seen left jugular central line is been removed and a new left PICC line placed in the mid superior vena cava. The lungs are well aerated. Mild bibasilar atelectatic changes are seen. No focal confluent infiltrate is noted. IMPRESSION: Bibasilar atelectasis. PICC line as described. Electronically Signed   By: Alcide Clever M.D.   On: 10/03/2015  19:50      Medications:     Current Medications: . pantoprazole (PROTONIX) IV  40 mg Intravenous Q24H  . potassium chloride SA  40 mEq Oral Daily     Infusions: . sodium chloride    . amiodarone 60 mg/hr (09-Oct-2015 4098)   Followed by  . amiodarone    . dextrose 5 % and 0.45% NaCl 50 mL/hr at 2015/10/09 0407  . insulin (NOVOLIN-R) infusion Stopped (10-09-15 0745)  . phenylephrine (NEO-SYNEPHRINE) Adult infusion 300 mcg/min (10-09-15 0816)  . piperacillin-tazobactam (ZOSYN)  IV    . sodium chloride    . vancomycin        Assessment/ Plan /Discussion     1. Septic Shock - Blood Cultures obtained x2. 2/2 cultures + gram cocci. Suspect PICC-related infection with septic shock.  Urine and Suptum culture obtained. Vancocmycin and Zosyn started. PICC removed in the ED. Started Neo.  2. Acute Respiratory Distress- Intubated by CCM  3. A flutter/Atach RVR -- Shock x5. Unsuccessful. Given 150 mg amio bolus. Started on amio 60 mg per hour.  4.  DM- DKA.  5. Acute on Chronic CKD- Elevated creatinine on admit. 1.95.   45 minutes critical care time  Admitting to ICU.     Length of Stay: 1  Amy Clegg NP-C  2015-10-09, 8:24 AM  Advanced Heart Failure Team Pager [phone] (M-F; 7a - 4p)  Please contact [facility] for night-coverage after hours (4p -7a ) and weekends on [email or web address]  Patient seen with NP, agree with the above note.  Patient arrived in the ER last night febrile/septic and was started on antibiotics.  This morning, he decompensated and became hypotensive.  He was intubated and started on phenylephrine.  HR to the 160s, suspect atrial tachycardia.  Progressive acidosis and hypotension.  PICC line removed (likely source of infection) and CVL/art line placed.  Milrinone stopped.  He was begun on amiodarone and DCCV was attempted for rapid atrial tachycardia but failed.  HR decreased to 130s range and he went to CCU.  In CCU, he decompensated with PEA arrest requiring  HCO3 and epinephrine.  Initially, he responded to HCO3/epinephrine with increased BP but despite epinephrine gtt, his BP continued to fall and eventually did not respond to treatment.  He subsequently expired.  Significant other and son were here in the hospital with him.   Marca Ancona Oct 09, 2015 10:46 AM

## 2015-10-14 NOTE — Code Documentation (Signed)
CODE BLUE NOTE  Patient Name: Daniel Blankenship   MRN: [id number]   Date of Birth/ Sex: 09-20-1956 , male      Admission Date: 09/27/2015  Attending Provider: Leslye Peerobert S Byrum, MD  Primary Diagnosis: End-stage CHF DKA Sepsis with GNC bacteremia    Indication: Pt was in his usual state of health until this AM, when he was noted to have difficulty breathing, tachycardia and return of fever to 104F and went into SVT. He was intubated and cardioverted. PICC line removed. A-line placed. Code blue was called. At the time of arrival on scene, ACLS protocol was underway. He became asystolic in the CCU. He did return to sinus tachycardia but could not support blood pressure despite maximum treatment.   Technical Description:  - CPR performance duration:  56 minutes  - Was defibrillation or cardioversion used? Yes   - Was external pacer placed? No  - Was patient intubated pre/post CPR? Yes    Medications Administered: Y = Yes; Blank = No Amiodarone   Y  Atropine   N  Calcium   Y  Epinephrine   Y (x14)  Lidocaine   N  Magnesium   Y  Norepinephrine   N  Phenylephrine   Y  Sodium bicarbonate   Y (4 amps)  Vasopressin   N    Post CPR evaluation:  - Final Status - Was patient successfully resuscitated ? No   Miscellaneous Information:  - Time of death:  10:21 AM  - Primary team notified?  Yes  - Family Notified? Yes        Casey BurkittHillary Moen Fitzgerald, MD   10/02/2015, 10:30 AM

## 2015-10-14 NOTE — ED Notes (Signed)
Pt transferred to Trauma A.

## 2015-10-14 NOTE — Consult Note (Signed)
PULMONARY / CRITICAL CARE MEDICINE   Name: Daniel Blankenship MRN: [id number] DOB: 12-04-1955    ADMISSION DATE:  September 26, 2015 CONSULTATION DATE:  12/9  REFERRING MD:  EDP  CHIEF COMPLAINT:  Abd pain  HISTORY OF PRESENT ILLNESS:   Mr.  Blankenship s a 60 old AAM with a plethora of health problems that include but not limited to ICM with ef 15%, [name] implant, milrinone drip via left picc at home, a flutter non responsive ablation,who presented to ED 12/8 with malaise, nausea and abd discomfort. While in ED he decompensated and was intubated 12/9 0700 and was in shock, treated for DKA and further developed svt hr >160. PCCM was consulted and assumed his care. Cards was at bedside and cardioversion was attempted x 3. PCCM will admit and cards will follow closely. He is a full code currently. Labs revealed a GNC bacteremia  PAST MEDICAL HISTORY :  He  has a past medical history of Atrial flutter with rapid ventricular response (HCC); Vascular dementia; CHF (congestive heart failure) (HCC); Delirium; Ischemic cardiomyopathy; Septic shock (HCC) (12/2014); CAD (coronary artery disease); Anemia; Hypertension; Hyperlipidemia; Gout of hand; [name] (automatic cardioverter/defibrillator) present; Myocardial infarction (HCC) (1979?); Pneumonia; IDDM (insulin dependent diabetes mellitus) (HCC); History of blood transfusion (1982); Arthritis; Anxiety; Panic attacks; Acute renal failure (HCC); and PTSD (post-traumatic stress disorder).  PAST SURGICAL HISTORY: He  has past surgical history that includes Cardioversion; Cardiac defibrillator placement (~ 2013); Atrial flutter ablation (N/A, 01/05/2015); TEE without cardioversion (N/A, 01/05/2015); Appendectomy; Tibia fracture surgery (Left, 1982); Patella reconstruction (Left, 1982); Ankle fracture surgery (Bilateral, 1980's); Joint replacement; Fracture surgery; Coronary angioplasty with stent (~ 2013); and TEE without cardioversion (N/A, 04/23/2015).  Allergies  Allergen  Reactions  . Sulfa Antibiotics Anaphylaxis and Hives  . Sulfamethoxazole Anaphylaxis and Hives    unknown  . Zocor [Simvastatin] Other (See Comments)    Swelling, trouble breathing.  . Lisinopril Rash    Other reaction(s): Angioedema (ALLERGY/intolerance)    No current facility-administered medications on file prior to encounter.   Current Outpatient Prescriptions on File Prior to Encounter  Medication Sig  . aspirin EC 81 MG tablet Take 1 tablet (81 mg total) by mouth daily.  Marland Kitchen atorvastatin (LIPITOR) 80 MG tablet Take 40 mg by mouth daily.   . colchicine 0.6 MG tablet Take 1 tablet (0.6 mg total) by mouth daily. (Patient taking differently: Take 0.6 mg by mouth as needed. )  . digoxin (LANOXIN) 0.125 MG tablet Take 1 tablet (0.125 mg total) by mouth daily.  Marland Kitchen docusate sodium (COLACE) 100 MG capsule Take 200 mg by mouth daily.  . hydrALAZINE (APRESOLINE) 25 MG tablet Take 1.5 tablets (37.5 mg total) by mouth 3 (three) times daily.  . insulin aspart (NOVOLOG) 100 UNIT/ML injection Inject 12 Units into the skin 3 (three) times daily with meals.  . insulin glargine (LANTUS) 100 UNIT/ML injection Inject 0.6 mLs (60 Units total) into the skin at bedtime.  . isosorbide mononitrate (IMDUR) 60 MG 24 hr tablet Take 1 tablet (60 mg total) by mouth daily.  . ivabradine (CORLANOR) 5 MG TABS tablet Take 1.5 tablets (7.5 mg total) by mouth 2 (two) times daily with a meal.  . Magnesium Oxide 200 MG TABS Take 2 tablets (400 mg total) by mouth 3 (three) times daily.  . milrinone (PRIMACOR) 20 MG/100ML SOLN infusion Inject 45.4125 mcg/min into the vein continuous.  Marland Kitchen oxyCODONE-acetaminophen (ROXICET) 5-325 MG per tablet Take 1 tablet by mouth every 6 (six) hours as needed for  pain.  . pantoprazole (PROTONIX) 40 MG tablet Take 1 tablet (40 mg total) by mouth daily.  . potassium chloride SA (K-DUR,KLOR-CON) 20 MEQ tablet Take 2 tablets (40 mEq total) by mouth daily.  Marland Kitchen spironolactone (ALDACTONE) 25 MG  tablet Take 1 tablet (25 mg total) by mouth daily.  Marland Kitchen torsemide (DEMADEX) 20 MG tablet Take 2 tablets (40 mg total) by mouth daily.  . Insulin Syringes, Disposable, U-100 1 ML MISC 12 Units by Does not apply route 3 (three) times daily before meals.    FAMILY HISTORY:  His has no family status information on file.   SOCIAL HISTORY: He  reports that he has quit smoking. His smoking use included Cigarettes. He has a 21 pack-year smoking history. He has never used smokeless tobacco. He reports that he drinks alcohol.  REVIEW OF SYSTEMS:   NA on vent  SUBJECTIVE:  Sedated on vent  VITAL SIGNS: BP 86/54 mmHg  Pulse 165  Temp(Src) 104.5 F (40.3 C) (Rectal)  Resp 15  Ht  (1.854 m)  Wt 290 lb (131.543 kg)  BMI 38.27 kg/m2  SpO2 96%  HEMODYNAMICS:    VENTILATOR SETTINGS: Vent Mode:  [-] PRVC FiO2 (%):  [100 %] 100 % Set Rate:  [15 bmp] 15 bmp Vt Set:  [640 mL] 640 mL PEEP:  [5 cmH20] 5 cmH20 Plateau Pressure:  [19 cmH20] 19 cmH20  INTAKE / OUTPUT: I/O last 3 completed shifts: In: 1480 [P.O.:480; I.V.:1000] Out: 450 [Urine:450]  PHYSICAL EXAMINATION: General:  MO AAM  Sedated on vent Neuro:  Unable to assess. NMB HEENT: OTT. JVD 1/2 up Cardiovascular:  HSD RRR Lungs: Decreased bs Abdomen:  Obese quiet Musculoskeletal:  intact Skin:  Cool clammy  LABS:  BMET  Recent Labs Lab 10-20-2015 1950 October 20, 2015 2300 10/10/2015 0356  NA 130*  --  131*  K 4.1  --  4.0  CL 94*  --  99*  CO2 17*  --  22  BUN 26*  --  27*  CREATININE 1.95* 2.10* 1.99*  GLUCOSE 459*  --  279*    Electrolytes  Recent Labs Lab 2015/10/20 1950 Oct 20, 2015 2300 09/13/2015 0356  CALCIUM 8.9  --  8.2*  MG 1.7 2.0  --     CBC  Recent Labs Lab 10/20/2015 1950 Oct 20, 2015 2300 09/19/2015 0356  WBC 19.5* 18.6* 14.6*  HGB 12.3* 11.8* 12.7*  HCT 37.4* 35.0* 37.2*  PLT 73* 65* 60*    Coag's No results for input(s): APTT, INR in the last 168 hours.  Sepsis Markers  Recent Labs Lab  10/20/15 2037 2015-10-20 2300  LATICACIDVEN 4.88* 2.2*    ABG No results for input(s): PHART, PCO2ART, PO2ART in the last 168 hours.  Liver Enzymes No results for input(s): AST, ALT, ALKPHOS, BILITOT, ALBUMIN in the last 168 hours.  Cardiac Enzymes  Recent Labs Lab 10/07/2015 0356  TROPONINI 0.15*    Glucose  Recent Labs Lab 09/23/2015 0039 10/09/2015 0154 10/12/2015 0259 09/19/2015 0400 09/25/2015 0513 09/23/2015 0619  GLUCAP 384* 325* 311* 238* 216* 179*    Imaging Dg Chest Port 1 View  09/29/2015  CLINICAL DATA:  60 year old male status post central line placement. EXAM: PORTABLE CHEST 1 VIEW COMPARISON:  Chest x-ray-09/23/2015. FINDINGS: Interval placement of a new left IJ central venous catheter with tip terminating in the distal superior vena cava. Previously noted left subclavian catheter with tip terminating in the distal superior vena cava is unchanged. An endotracheal tube is in place with tip 4.5 cm above  the carina. A nasogastric tube is seen extending into the stomach, however, the tip of the nasogastric tube extends below the lower margin of the image. Right-sided pacemaker/[name] with lead tip terminating over the expected location of the right ventricular apex. Percutaneous defibrillator pads are noted projecting over the lower left hemithorax. Lung volumes are very low. No pneumothorax. There is cephalization of the pulmonary vasculature, indistinctness of the interstitial markings, and patchy airspace disease throughout the lungs bilaterally suggestive of moderate pulmonary edema. Moderate cardiomegaly. Small left pleural effusion. Upper mediastinal contours are distorted by patient positioning. IMPRESSION: 1. Support apparatus, as above. 2. No pneumothorax or other acute complicating features following central line placement. 3. Findings, as above, suggestive of worsening moderate congestive heart failure. Electronically Signed   By: Trudie Reed M.D.   On: 10/08/2015 08:07    Dg Chest Portable 1 View  09/17/2015  CLINICAL DATA:  New endotracheal tube EXAM: PORTABLE CHEST 1 VIEW COMPARISON:  None. FINDINGS: Endotracheal to 3.3 cm from carina. Stable enlarged cardiac silhouette. Perihilar airspace disease slightly increased from prior. No pneumothorax. LEFT PICC line in place. IMPRESSION: 1. Interval intubation and NG tube placement without complication. 2. Interval increase in central airspace disease representing edema versus pneumonia or aspiration pneumonitis. Electronically Signed   By: Genevive Bi M.D.   On: 09/29/2015 07:43   Dg Chest Portable 1 View  2015/09/29  CLINICAL DATA:  Weakness and shortness of Breath EXAM: PORTABLE CHEST - 1 VIEW COMPARISON:  01/06/2015 FINDINGS: Defibrillator is again seen and stable. Cardiac shadow remains enlarged. The previously seen left jugular central line is been removed and a new left PICC line placed in the mid superior vena cava. The lungs are well aerated. Mild bibasilar atelectatic changes are seen. No focal confluent infiltrate is noted. IMPRESSION: Bibasilar atelectasis. PICC line as described. Electronically Signed   By: Alcide Clever M.D.   On: 09/29/2015 19:50     STUDIES:    CULTURES: 12/9 bc x 2>> GPC in clusters >>  12/9 uc>> 12/9 sputm>>  ANTIBIOTICS: 12/9 vanc>> 12/9 zoysn>>  SIGNIFICANT EVENTS: 12/9 intubated 12/9 cardioverted x 3   LINES/TUBES: 12/9 OTT>> 12/9 l i j cvl>> 12/9 r fem a line>>  DISCUSSION: MO (290 ibs) with known ischemic CM with Dm, c/b non compliance who presents with fever 104, dirty PICC, EF 15% on milrinone at home. Intuabted and in shock. GNC bacteremia.   ASSESSMENT / PLAN:  PULMONARY A: VDRF secondary to Shock, sepsis with bactermia P:   Vent bundle Increase RR to compensate for metabolic acidosis  Avoid BD's for now  CARDIOVASCULAR A:  Shock with ? Sepsis as driving factor.  Wide complex tachycardia 160's ? Underlying a fib/flutter CM with EF 15% on  home milrinone  P:  Changed levo to neo Dc milrinone to remove offending PICC  Cardioversion x 3 per cards Amiodarone infusion  RENAL Lab Results  Component Value Date   CREATININE 1.99* 09/16/2015   CREATININE 2.10* September 29, 2015   CREATININE 1.95* 09/29/15   A:   AKI P:   Follow creatinine with BP and volume support Careful monitoring of K+ with acidosis and hyperglycemia  Bicarb gtt initiated given shock state  GASTROINTESTINAL A:   MO  GI protection P:   PPI  HEMATOLOGIC A:   No acute issues P:  May need anticoagulation depending on cardia rhythm   INFECTIOUS A:   2/2 BC WITH GPC , FEVER 104 WITH OLD LEFT PICC P:   picc DC'D  12/9 12/9 VANC>> 12/9 Zoysn>>  ENDOCRINE A:   DM with acute excerebration  P:   DKA protocol. Can transition quickly to SSI  NEUROLOGIC A:   Sedated and with NMB on board. A&O prior to intubation P:   RASS goal: -1 Sedation protocol for tube tolerance   FAMILY  - Updates: Dr Delton CoombesByrum spoke in person with pt's girlfriend Bjorn LoserRhonda and son Cloretta NedQuan, by phone with pt's daughter and Dalene CarrowHCPOA Michelle. Also reviewed case with Cardiology. Explained to Marcelino DusterMichelle that all aggressive interventions were being done and that we were pushing epi every several minutes while on epi gtt in a modified ACLS. No significant improvement felt to come from mechanical support such as IABP based on discussion with Dr Gala RomneyBensimhon. I explained to [place] and to [place] that we were not achieving stability and that he would not recover. I recommended no more pushes of epi and no further CPR. Both understood and agreed. Bjorn Loserhonda and Cloretta NedQuan were allowed to visit in pt's ICU room. No further CPR was done and he expired without evidence discomfort with the family present.    Brett CanalesSteve Minor ACNP [facility] Pager [phone] till 3 pm If no answer page [phone] 09/28/2015, 8:24 AM   Attending Note:  I have examined patient, reviewed labs, studies and notes. I have discussed the  case with S Minor, and I agree with the data and plans as amended above. Septic shock superimposed on cardiogenic shock and acute on chronic systolic heart failure due to [facility] bacteremia. On my initial eval and multiple subsequent evals he has never stabilized hemodynamically. Cardioversion was attempted post-intubation but unclear that it impacted anything. He is on broad abx, has had his PICC removed and new CVC placed. He is on pressors, is receiving bicarb gtt. Has required epi pushes to avoid episodes of PEA.   As documented above, I spoke in person with pt's girlfriend Bjorn LoserRhonda and son Cloretta NedQuan, by phone with pt's daughter and Dalene CarrowHCPOA Michelle. Also reviewed case with Cardiology. Explained to Marcelino DusterMichelle that all aggressive interventions were being done and that we were pushing epi every several minutes while on epi gtt in a modified ACLS. No significant improvement felt to come from mechanical support such as IABP based on discussion with Dr Gala RomneyBensimhon. I explained to [place] and to [place] that we were not achieving stability and that he would not recover. I recommended no more pushes of epi and no further CPR. Both understood and agreed. Bjorn Loserhonda and Cloretta NedQuan were allowed to visit in pt's ICU room. No further CPR was done and he expired without evidence discomfort with the family present.   Independent critical care time is 150 minutes not including procedures and physician extender's time.   Levy Pupaobert Katelee Schupp, MD, PhD 09/15/2015, 11:27 AM  Pulmonary and Critical Care [phone] or if no answer [phone]

## 2015-10-14 NOTE — ED Notes (Addendum)
Per IV team, it is okay to use the second port in the pt's PICC line.

## 2015-10-14 NOTE — Code Documentation (Signed)
Pt. Was shocked X1 by Dr. Eartha Inchalton McClain, [address]. No change in rythmn

## 2015-10-14 NOTE — Code Documentation (Signed)
Pt intubated with 8.0 tube, 23 cm at teeth. Positive color change and bilateral breath sounds heard per Mora Bellmanni MD

## 2015-10-14 NOTE — Procedures (Signed)
Arterial Catheter Insertion Procedure Note Tommas OlpChristopher T Goetze [id number] April 28, 1956  Procedure: Insertion of Arterial Catheter  Indications: Blood pressure monitoring and Frequent blood sampling  Procedure Details Consent: Unable to obtain consent because of emergent medical necessity. Time Out: Verified patient identification, verified procedure, site/side was marked, verified correct patient position, special equipment/implants available, medications/allergies/relevent history reviewed, required imaging and test results available.  Performed  Maximum sterile technique was used including antiseptics, cap, gloves, gown, hand hygiene, mask and sheet. Skin prep: Chlorhexidine; local anesthetic administered 20 gauge catheter was inserted into right femoral artery using the Seldinger technique.  Evaluation Blood flow good; BP tracing good. Complications: No apparent complications.   Brett CanalesSteve Minor ACNP [facility] Pager [phone] till 3 pm If no answer page [phone] 09/26/2015, 9:00 AM   Levy Pupaobert Joshlyn Beadle, MD, PhD 09/17/2015, 9:33 AM [place] [facility] [phone] or if no answer [phone]

## 2015-10-14 NOTE — Code Documentation (Signed)
Levophed rate changed to 448mcg/hr per Mora Bellmanni MD

## 2015-10-14 NOTE — ED Notes (Signed)
Upon inspection of the pt milrinone pump was found to be off, pt pump turned back on.

## 2015-10-14 NOTE — ED Provider Notes (Signed)
I was called to the room for a patient that was previously admitted to step down and has now decompensated.  Upon my evaluation, patient is hypoxic to 82% on 4 L nasal cane, heart rate is in the 150s, blood pressure is 80/50. Patient is in clear need of intubation and elevated care. Patient was initially transferred to the resuscitation bay, norepinephrine drip was started, patient was intubated. Dr. Maryfrances Bunnellanford has spoke with the intensivist for admission. Patient remains in critical condition.   INTUBATION Performed by: Tomasita CrumbleNI,Hamdi Vari  Required items: required blood products, implants, devices, and special equipment available Patient identity confirmed: provided demographic data and hospital-assigned identification number Time out: Immediately prior to procedure a "time out" was called to verify the correct patient, procedure, equipment, support staff and site/side marked as required.  Indications: respiratory failure  Intubation method: Direct Laryngoscopy   Preoxygenation: 10L [place]  Sedatives: 15mg  Etomidate Paralytic: 100mg  Rocuronium  Tube Size: 8-0 cuffed  Post-procedure assessment: chest rise and ETCO2 monitor Breath sounds: equal and absent over the epigastrium Tube secured with: ETT holder Chest x-ray interpreted by radiologist and me.  Chest x-ray findings: endotracheal tube in appropriate position  Patient tolerated the procedure well with no immediate complications.     Tomasita CrumbleAdeleke Heydi Swango, MD 09/29/2015 [phone]

## 2015-10-14 NOTE — Code Documentation (Signed)
Updated Critical Care Dr. Delton CoombesByrum, He will order sedation when pt. Gets to his room,  He is aware of pt beginning to wake up.

## 2015-10-14 NOTE — Code Documentation (Signed)
Levophed started at 74mcg/hr per Mora Bellmanni MD

## 2015-10-14 NOTE — ED Notes (Addendum)
Pt with severe chills, speech is hard to understand, pt is alert and oriented x 3. [place], MD notified and is en route to bedside.

## 2015-10-14 NOTE — ED Notes (Signed)
Pt CBG is 384, Nurse notified

## 2015-10-14 NOTE — ED Notes (Signed)
CBG was 179. 

## 2015-10-14 NOTE — Code Documentation (Signed)
Family at the bedside. Paged Christella HartiganStever Minerd, NP

## 2015-10-14 NOTE — Progress Notes (Signed)
Responded to code blue  page to provide spiritual and emotional support to patient girlfriend/caretaker who was located in [facility] waiting area. Facilitated information sharing between staff and family. Assisted doctor with connecting with patient's [place] Marcel[place]. Doctor spoke with daughter who is the HPOA on telephone and advised her of patient status and possible end of life. One of the   Patient's  son arrive shortly before patient passed and had chance to join his mother at bedside to support each other. Chaplain provided empathetic listening, emotional, grief and spiritual support.   Patient family from [place] is in route to Hospital. Provided support to staff.   09/16/2015 1000  Clinical Encounter Type  Visited With Patient;Family;Patient and family together;Health care provider  Visit Type Initial;Spiritual support;Critical Care;Death;Patient actively dying  Referral From Nurse  Spiritual Encounters  Spiritual Needs Prayer;Emotional;Grief support  Stress Factors  Family Stress Factors Loss  Venida Jarvis[place], Braxtyn Dorff, Chaplain,pager [phone]

## 2015-10-14 NOTE — Progress Notes (Signed)
Treated with IV antibiotics. Patient improved.  Lactate trending down.  Tachycardia improving.  Repeat renal function pending.  Skin without mottling, and mental status clear.  Have some concern that patient is not adequately fluid resuscitated, about which I was being cautious because of low EF, but will administer more fluids now.

## 2015-10-14 NOTE — Progress Notes (Signed)
  Patient brought to CCU intubated on max dose neosynephrine with end-stage HF and septic shock in setting of recurrent PICC line infection.  On arrival to CCU became asystolic. Immediate CPR initiated. Patient treated with multiple boluses of epinephrine and bicarb. Started on epinephrine drip. Then had RSOC with what appears to be recurrent atrial tachycardia. Attempted DC-CV x 2 with no benefit. Bolused with IV amiodarone.   Despite epinephrine drip at 10. Patient continued to drop his pressure and required multiple epinephrine boluses.   Dr. Shirlee LatchMclean and CCM service also responded to code. Discussed possibility of mechanical support but felt not to be indicated given profound sepsis and lack of long-term durable advanced HF options.  Multiple discussions with family members about futility of resuscitation given overwehelming sepsis and end-stage HF. Patient made DNR. He passed quickly with family at bedside at 1020a.   The patient is critically ill with multiple organ systems failure and requires high complexity decision making for assessment and support, frequent evaluation and titration of therapies, application of advanced monitoring technologies and extensive interpretation of multiple databases.   Critical Care Time devoted to patient care services described in this note is 35 Minutes.  Richard Holz,MD 10:25 AM

## 2015-10-14 NOTE — Progress Notes (Signed)
[facility]  Patient Status: Active pt with [facility] prior to this readmission  [facility] is providing the following services: HHRN and [facility] team for home Milrinone. [facility] will follow [facility] and support DC to home when ordered.  If patient discharges after hours, please call [phone].   Daniel Blankenship 09/22/2015, 9:56 AM

## 2015-10-14 DEATH — deceased

## 2015-10-16 ENCOUNTER — Encounter (HOSPITAL_COMMUNITY): Payer: Medicare Other

## 2015-11-14 NOTE — Discharge Summary (Signed)
PULMONARY / CRITICAL CARE MEDICINE   Name: Daniel Blankenship MRN: [id number] DOB: [birth year]    ADMISSION DATE:  28-Sep-2015 Date of death: Sep 29, 2015  Final cause of death: Septic shock  Secondary causes of death: Staph aureus bacteremia PICC line infection Bilateral pulmonary infiltrates, ARDS versus cardiogenic pulmonary edema Severe chronic ischemic cardiomyopathy Possible acute on chronic systolic CHF DKA Diabetes mellitus Atrial flutter Chronic milrinone infusion Wide-complex tachycardia Acute renal failure Acute metabolic acidosis Hyperkalemia   Hospital course:   Mr.  Bevens s a 80 old AAM with ICM with ef 15%, [name] implant, chronic milrinone drip via left picc at home, a flutter non responsive to attempted ablation. He presented to ED 09-28-23 with malaise, nausea and abd discomfort. While in ED he decompensated and was intubated 09/29/2023 0700 and was in shock, treated for DKA and further developed svt hr >160. PCCM was consulted and assumed his care. Cards was at bedside and cardioversion was attempted x 3 without effect on his rhythm or shock. Lab work revealed a gram-positive bacteremia and his chronic PICC line was removed and replaced with a new central catheter. He was started on vasopressors, amiodarone drip. In order to get his PICC line out his milrinone was transiently stopped. He continued to be unstable and never achieved hemodynamic stability despite max dose pressors and intermittent pushes of epinephrine. Bicarbonate drip was initiated.  Discussions were undertaken with the patient's family who understood that all aggressive interventions were being made. Given this fact any further CPR was deferred. The case was discussed with cardiology and it was felt that he would not benefit significantly from an intra-aortic balloon pump. Despite antibiotics pressors and all aggressive interventions the patient developed PEA and expired on September 29, 2015.   PAST MEDICAL HISTORY :  He  has a  past medical history of Atrial flutter with rapid ventricular response (HCC); Vascular dementia; CHF (congestive heart failure) (HCC); Delirium; Ischemic cardiomyopathy; Septic shock (HCC) (12/2014); CAD (coronary artery disease); Anemia; Hypertension; Hyperlipidemia; Gout of hand; [name] (automatic cardioverter/defibrillator) present; Myocardial infarction (HCC) (1979?); Pneumonia; IDDM (insulin dependent diabetes mellitus) (HCC); History of blood transfusion (1982); Arthritis; Anxiety; Panic attacks; Acute renal failure (HCC); and PTSD (post-traumatic stress disorder).    Levy Pupa, MD, PhD 11/08/2015, 10:07 AM [place] [facility] [phone] or if no answer [phone]

## 2017-01-02 IMAGING — CT CT ANGIO CHEST
2 of 9 series · 18 of 46 positions shown · IV contrast (Omni 300)
Comparison: Chest x-ray dated 12/31/2014

CLINICAL DATA: Shortness of breath. Abdominal pain. Productive
cough.

EXAM:
CT ANGIOGRAPHY CHEST WITH CONTRAST
TECHNIQUE: Multidetector CT imaging of the chest was performed using the
standard protocol during bolus administration of intravenous
contrast. Multiplanar CT image reconstructions and MIPs were
obtained to evaluate the vascular anatomy.
CONTRAST:  100mL OMNIPAQUE IOHEXOL 350 MG/ML SOLN

[Series 5: thins · axial · 0.83mm/px · z∈[+1356,+1622]mm · 15 of 300 slices shown]
[im 17/300  lung]
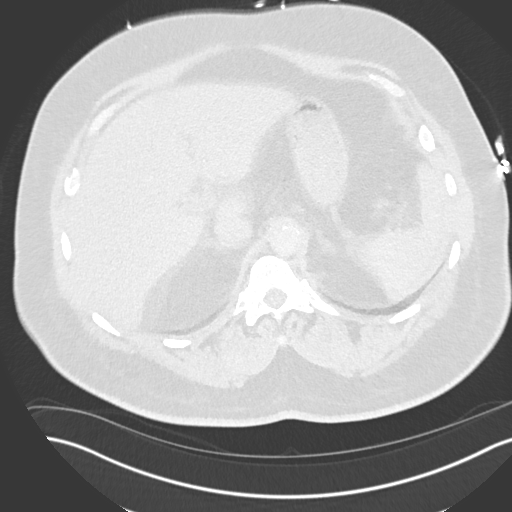
[im 34/300  soft-tissue]
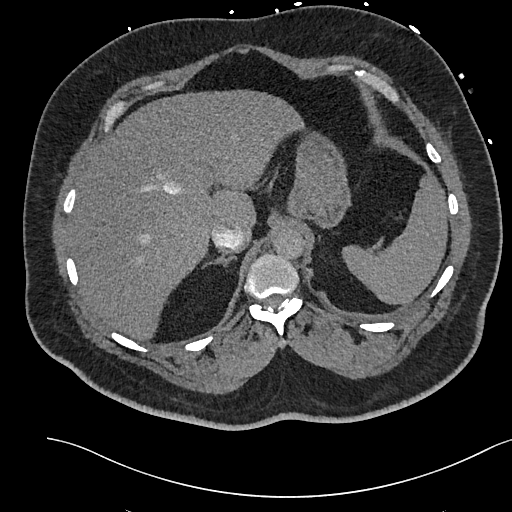
[im 50/300  lung]
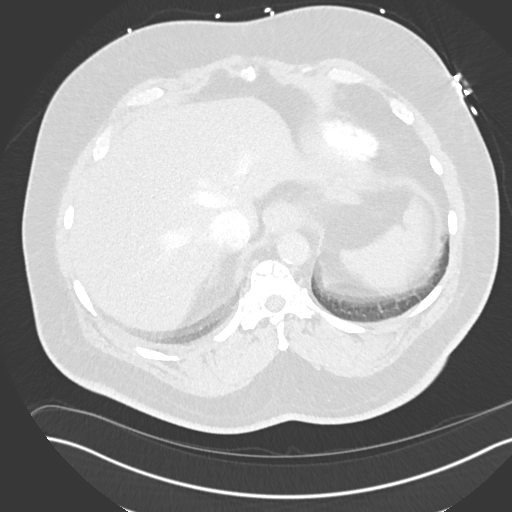
[im 67/300  soft-tissue]
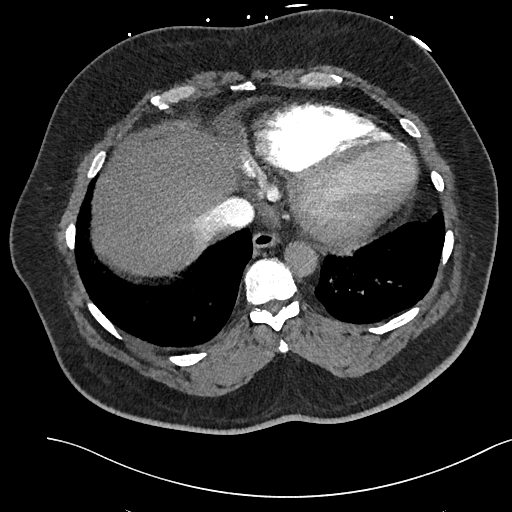
[im 100/300  lung]
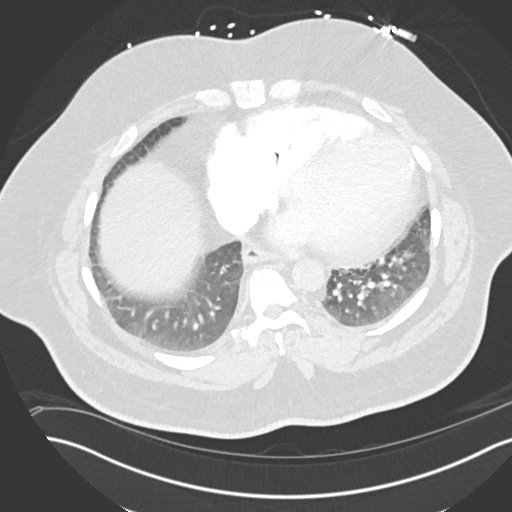
[im 117/300  soft-tissue]
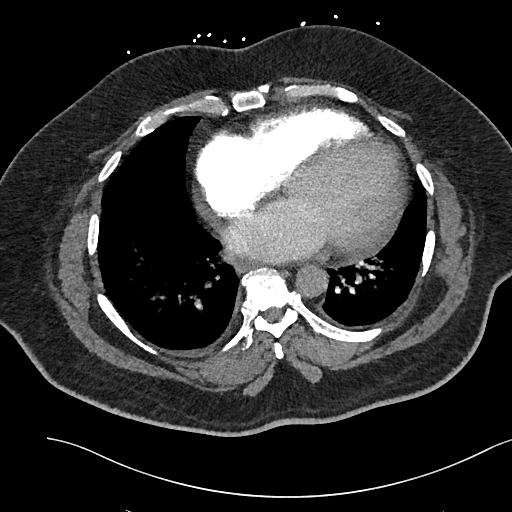
[im 133/300  lung]
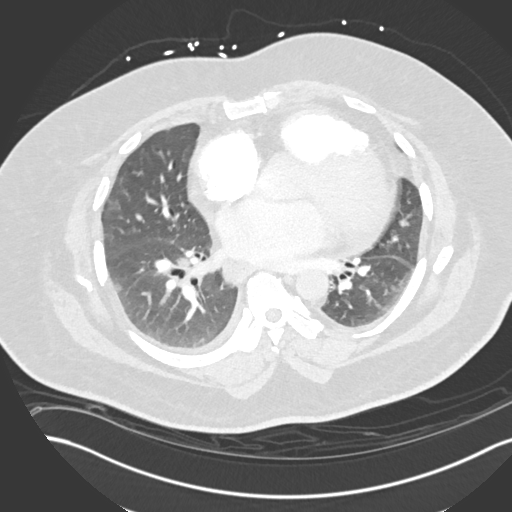
[im 150/300  soft-tissue]
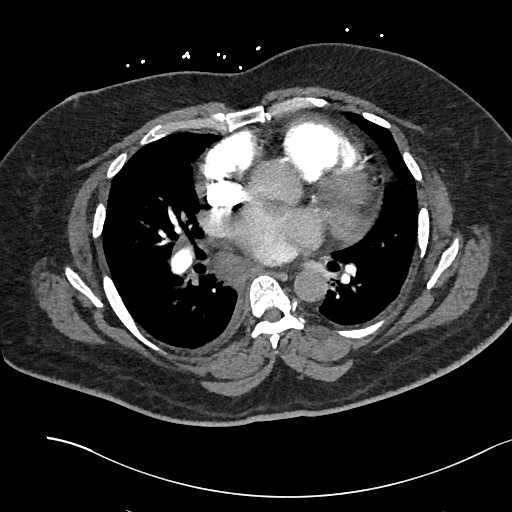
[im 167/300  lung]
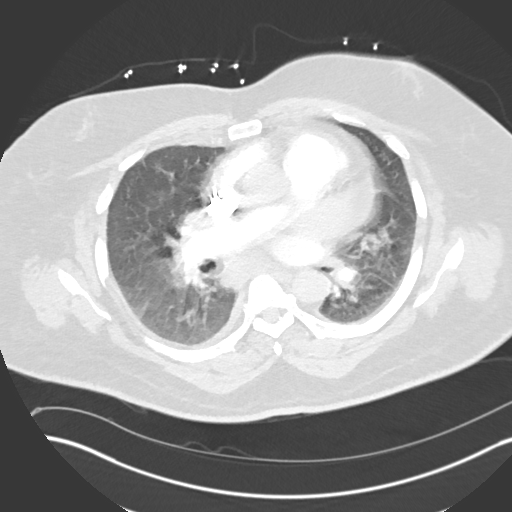
[im 183/300  soft-tissue]
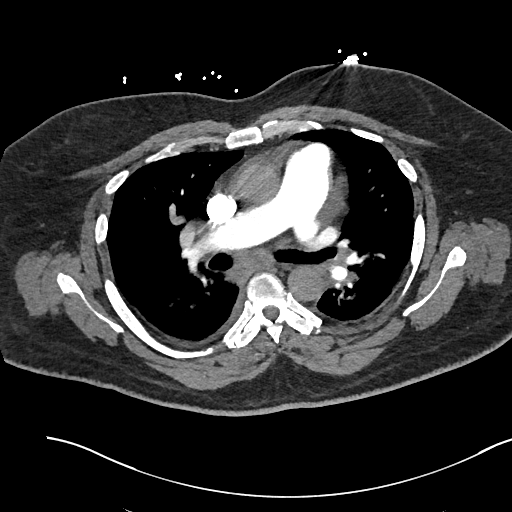
[im 200/300  lung]
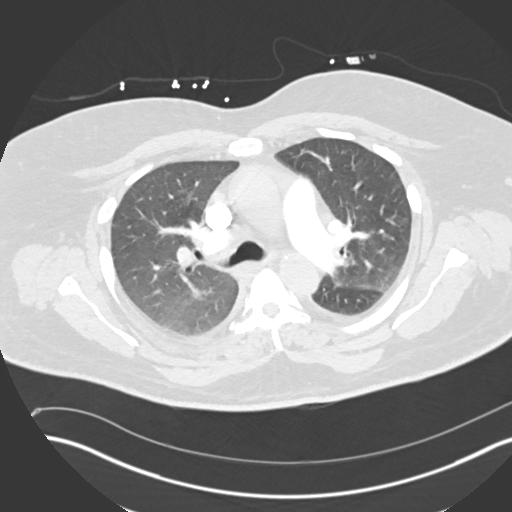
[im 233/300  soft-tissue]
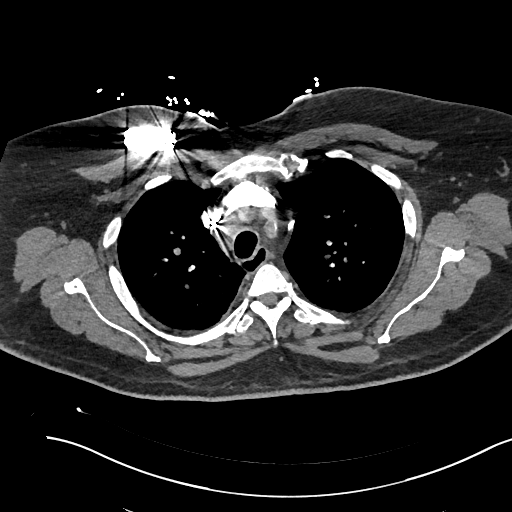
[im 250/300  lung]
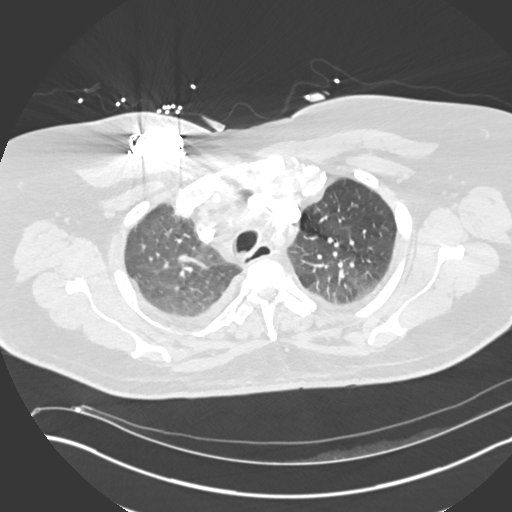
[im 266/300  soft-tissue]
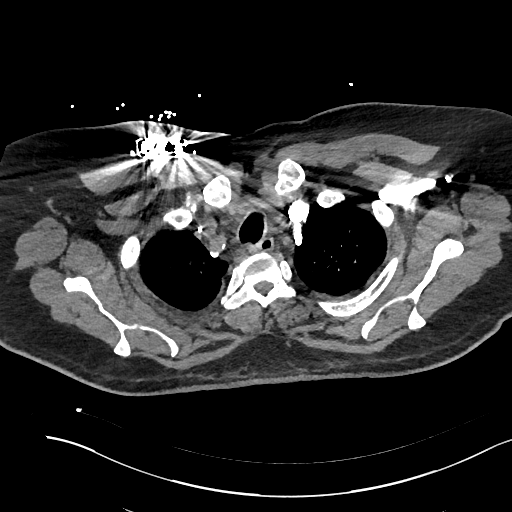
[im 283/300  lung]
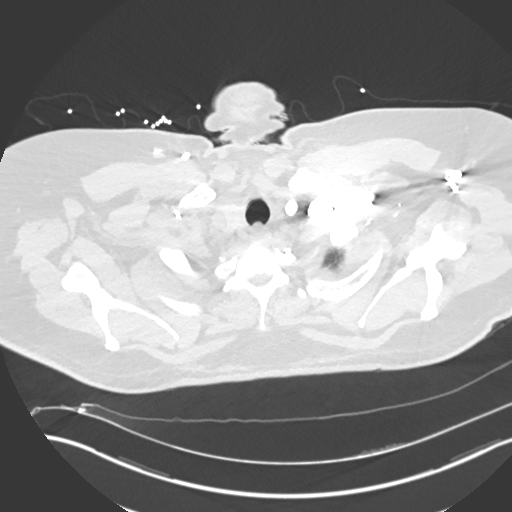

[Series 7: coronal mpr · coronal · 0.60mm/px · 3 of 177 slices shown]
[im 45/177  soft-tissue]
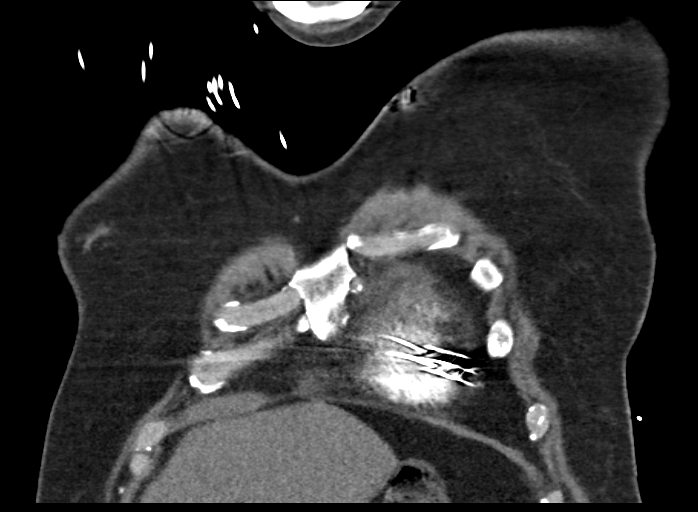
[im 89/177  soft-tissue]
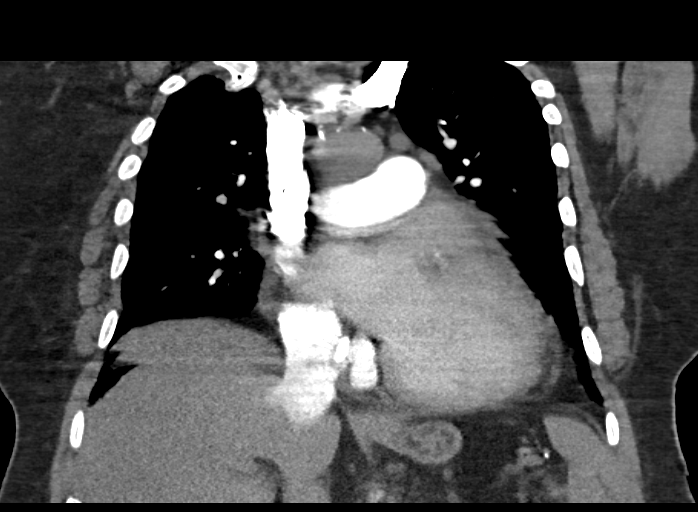
[im 133/177  soft-tissue]
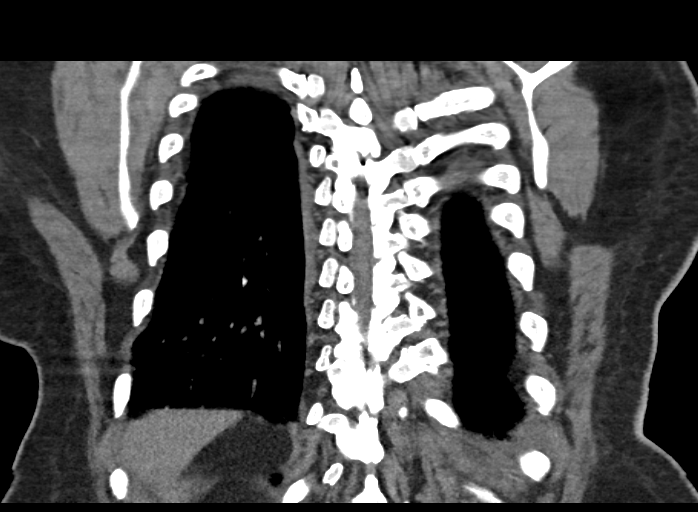

[18 of 46 positions shown; findings below may reference images not displayed]

FINDINGS: There are no pulmonary emboli or infiltrates or effusions. There is
a 5 mm peripheral nodule in the right lower lobe on image 58 of
series 6. The patient has mediastinal and hilar adenopathy. There is
a 13 mm node just to the left of the aortic arch on image 29, and
azygos node measuring 13 mm on image 33, and a prominent nodal mass
measuring 6.6 x 2.7 x 4.8 cm in the subcarinal region. Part of the
soft tissue density represents the esophagus but I think of the scar
this is adenopathy.

There is cardiomegaly with dilatation of the left ventricle.

No osseous abnormality. The visualized portion of the upper abdomen
is normal.

Review of the MIP images confirms the above findings.
IMPRESSION: 1. No pulmonary emboli.
2. Cardiomegaly with dilatation of the right ventricle.
3. Mediastinal and hilar adenopathy of unknown etiology. The
possibility of malignancy should be considered.
4. 5 mm nodule in the periphery of the right lower lobe. This should
be followed up in 6 months with chest CT without contrast if the
adenopathy is not determined to be malignant.

## 2017-01-03 IMAGING — US US ABDOMEN LIMITED
1 series · 14 of 25 positions shown · non-contrast
Comparison: None.

CLINICAL DATA: Abnormal LFTs

EXAM:
US ABDOMEN LIMITED - RIGHT UPPER QUADRANT

[Series 1: us abdomen limited · 0.26mm/px · 14 of 32 slices shown]
[im 1/32]
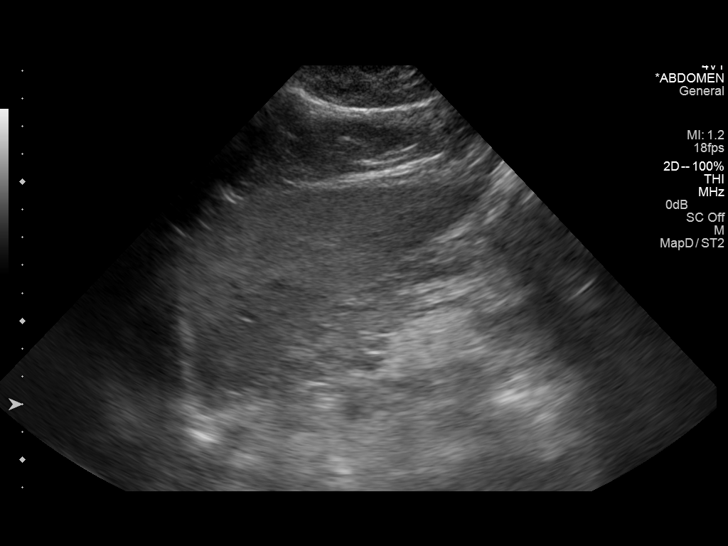
[im 3/32]
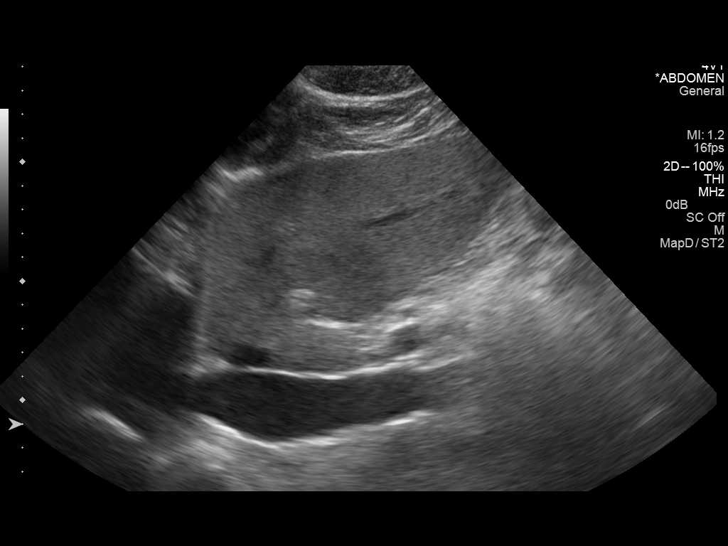
[im 6/32]
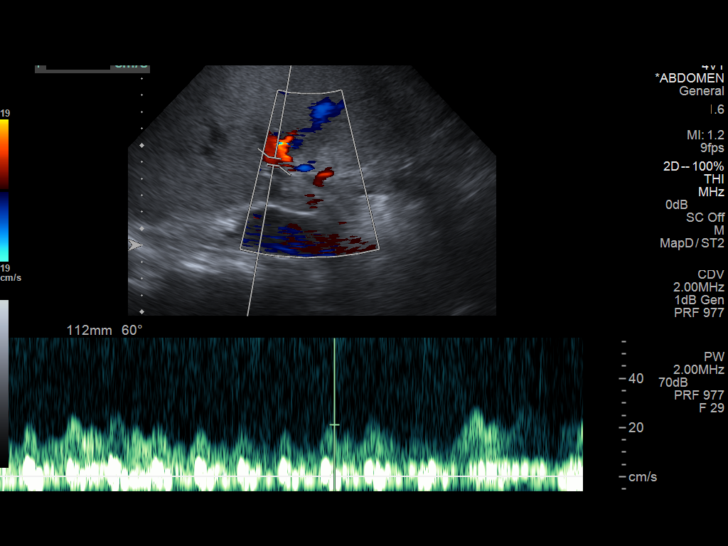
[im 8/32]
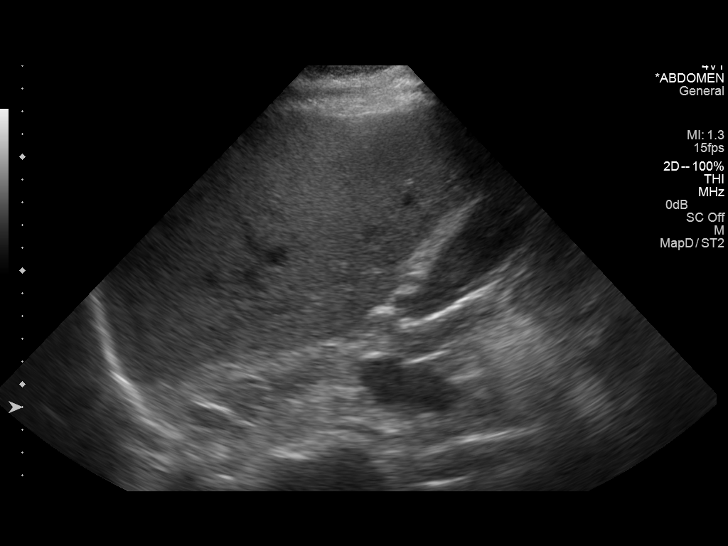
[im 11/32]
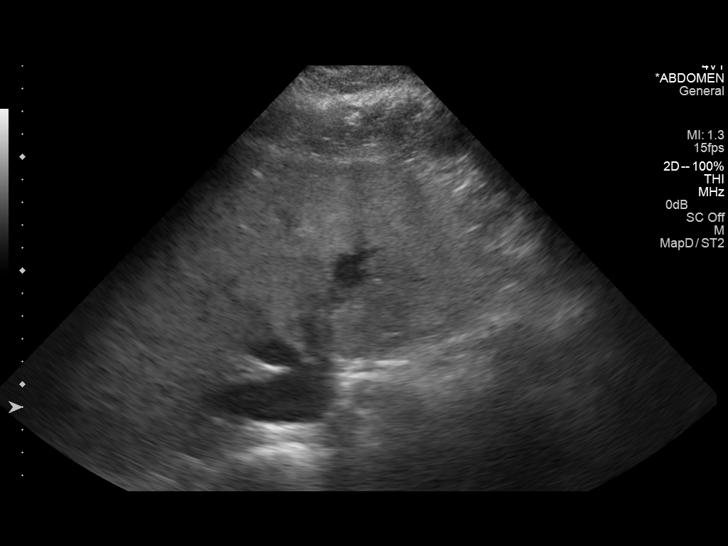
[im 12/32]
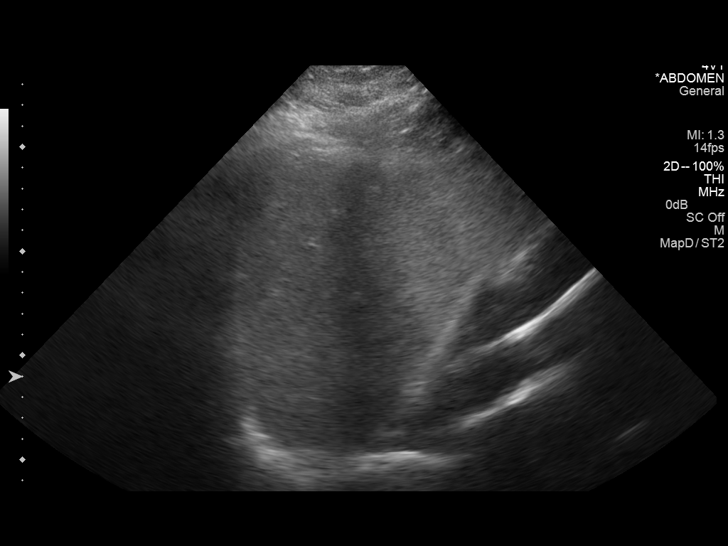
[im 15/32]
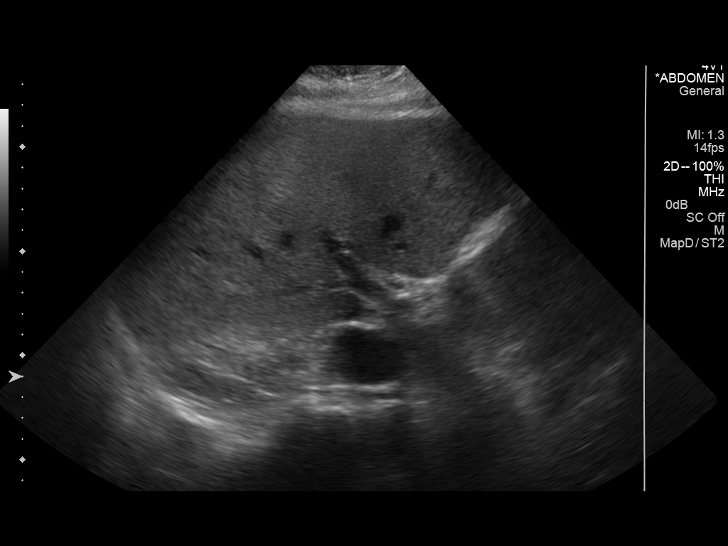
[im 17/32]
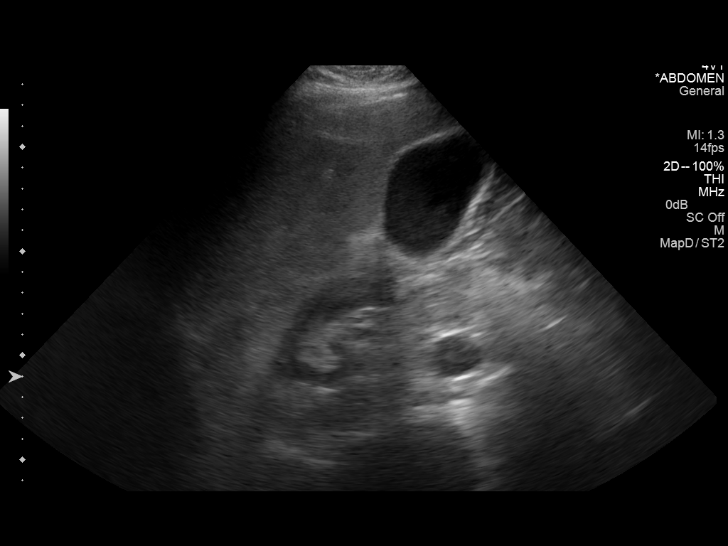
[im 20/32]
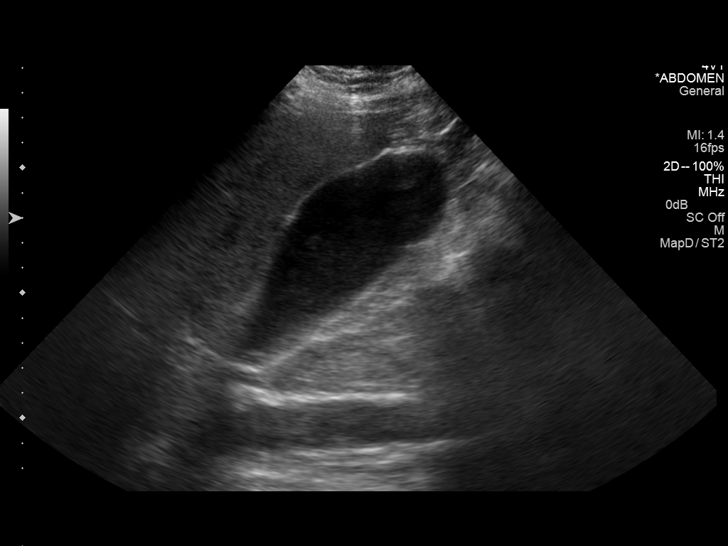
[im 21/32]
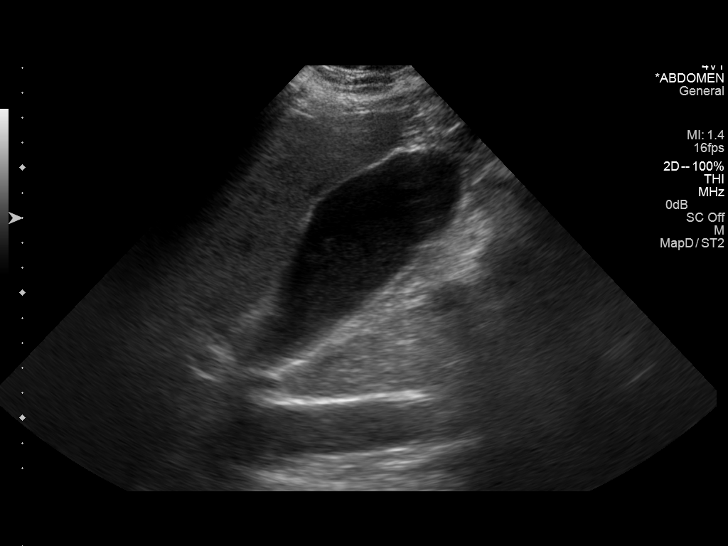
[im 24/32]
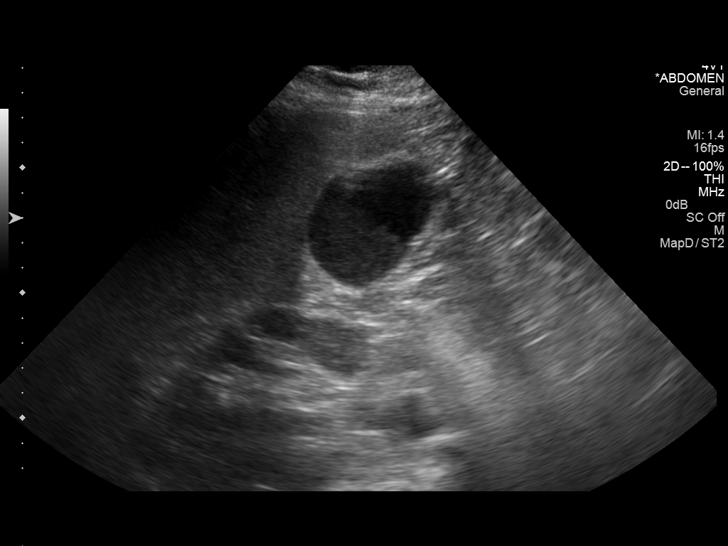
[im 26/32]
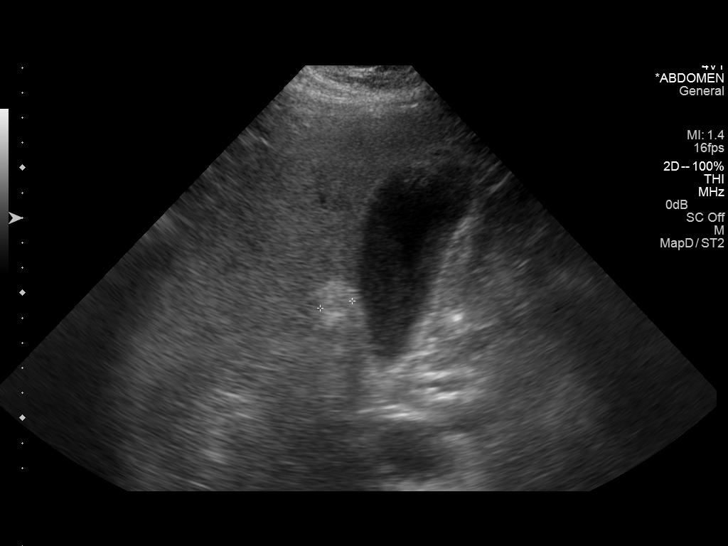
[im 29/32]
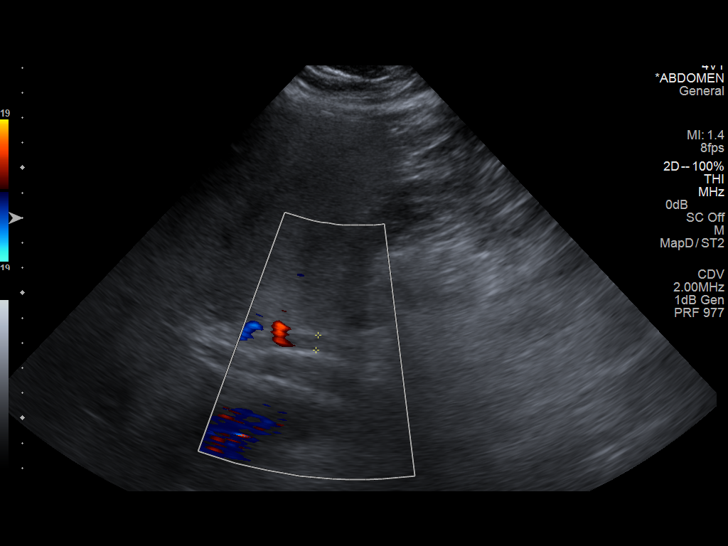
[im 32/32]
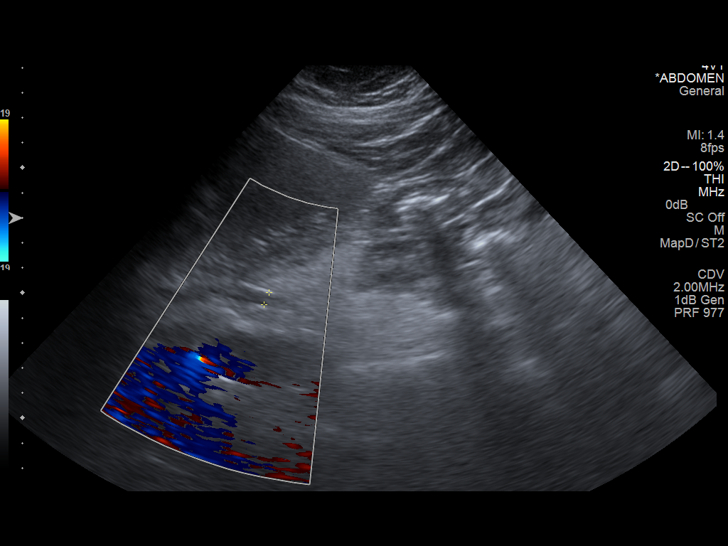

[14 of 25 positions shown; findings below may reference images not displayed]

FINDINGS: Gallbladder:

There is intraluminal sludge. Gallbladder wall thickness is normal,
2.0 mm. The patient was not tender over the gallbladder.

Common bile duct:

Diameter: 5.2 mm, normal

Liver:

The liver is enlarged, over 20 cm craniocaudal. It is diffusely
increased in echogenicity consistent with fatty infiltration. There
is a 2.5 cm focal hyperechoic homogeneous circumscribed lesion which
likely represents a benign cavernous hemangioma.
IMPRESSION: 1. Enlarged liver with abnormally increased echogenicity. Fatty
infiltration or other liver disease can produce this appearance.
2. 2.5 cm focal liver lesion near the gallbladder fossa, not
conclusively characterized but most likely a benign cavernous
hemangioma. MRI with contrast would conclusively characterize this
abnormality.
